# Patient Record
Sex: Male | Born: 1945
Health system: Southern US, Community
[De-identification: ages and names within clinical notes are randomized; demographics above are authoritative.]

## PROBLEM LIST (undated history)

## (undated) DIAGNOSIS — E7800 Pure hypercholesterolemia, unspecified: Secondary | ICD-10-CM

## (undated) DIAGNOSIS — I10 Essential (primary) hypertension: Secondary | ICD-10-CM

## (undated) DIAGNOSIS — C801 Malignant (primary) neoplasm, unspecified: Secondary | ICD-10-CM

## (undated) DIAGNOSIS — M1990 Unspecified osteoarthritis, unspecified site: Secondary | ICD-10-CM

## (undated) DIAGNOSIS — J189 Pneumonia, unspecified organism: Secondary | ICD-10-CM

## (undated) HISTORY — PX: INSERTION PROSTATE RADIATION SEED: SUR718

## (undated) HISTORY — PX: FOOT SURGERY: SHX648

---

## 1999-07-13 ENCOUNTER — Ambulatory Visit (HOSPITAL_COMMUNITY): Admission: RE | Admit: 1999-07-13 | Discharge: 1999-07-13 | Payer: Self-pay | Admitting: Gastroenterology

## 1999-07-13 ENCOUNTER — Encounter (INDEPENDENT_AMBULATORY_CARE_PROVIDER_SITE_OTHER): Payer: Self-pay | Admitting: Specialist

## 1999-12-26 ENCOUNTER — Ambulatory Visit (HOSPITAL_COMMUNITY): Admission: RE | Admit: 1999-12-26 | Discharge: 1999-12-26 | Payer: Self-pay | Admitting: Family Medicine

## 2003-09-28 ENCOUNTER — Ambulatory Visit (HOSPITAL_COMMUNITY): Admission: RE | Admit: 2003-09-28 | Discharge: 2003-09-28 | Payer: Self-pay | Admitting: Family Medicine

## 2003-11-04 ENCOUNTER — Ambulatory Visit (HOSPITAL_COMMUNITY): Admission: RE | Admit: 2003-11-04 | Discharge: 2003-11-04 | Payer: Self-pay | Admitting: Neurology

## 2007-06-11 HISTORY — PX: BACK SURGERY: SHX140

## 2007-10-20 ENCOUNTER — Inpatient Hospital Stay (HOSPITAL_COMMUNITY): Admission: RE | Admit: 2007-10-20 | Discharge: 2007-10-24 | Payer: Self-pay | Admitting: Neurological Surgery

## 2007-11-04 ENCOUNTER — Encounter: Admission: RE | Admit: 2007-11-04 | Discharge: 2007-11-04 | Payer: Self-pay | Admitting: Neurological Surgery

## 2008-01-07 ENCOUNTER — Observation Stay (HOSPITAL_COMMUNITY): Admission: RE | Admit: 2008-01-07 | Discharge: 2008-01-07 | Payer: Self-pay | Admitting: Neurological Surgery

## 2008-02-05 ENCOUNTER — Encounter: Admission: RE | Admit: 2008-02-05 | Discharge: 2008-02-05 | Payer: Self-pay | Admitting: Family Medicine

## 2009-02-14 ENCOUNTER — Encounter: Admission: RE | Admit: 2009-02-14 | Discharge: 2009-02-14 | Payer: Self-pay | Admitting: Orthopedic Surgery

## 2009-02-28 ENCOUNTER — Emergency Department (HOSPITAL_COMMUNITY): Admission: EM | Admit: 2009-02-28 | Discharge: 2009-02-28 | Payer: Self-pay | Admitting: Emergency Medicine

## 2009-10-10 ENCOUNTER — Ambulatory Visit (HOSPITAL_COMMUNITY): Admission: RE | Admit: 2009-10-10 | Discharge: 2009-10-10 | Payer: Self-pay | Admitting: Neurological Surgery

## 2009-11-07 IMAGING — CR DG CHEST 2V
2 series · 2 of 2 positions shown · non-contrast
Comparison: None

CLINICAL DATA: Preop for lumbar spine fusion, diabetes,
hypertension

CHEST - 2 VIEW

[view not recorded (1 of 2)]
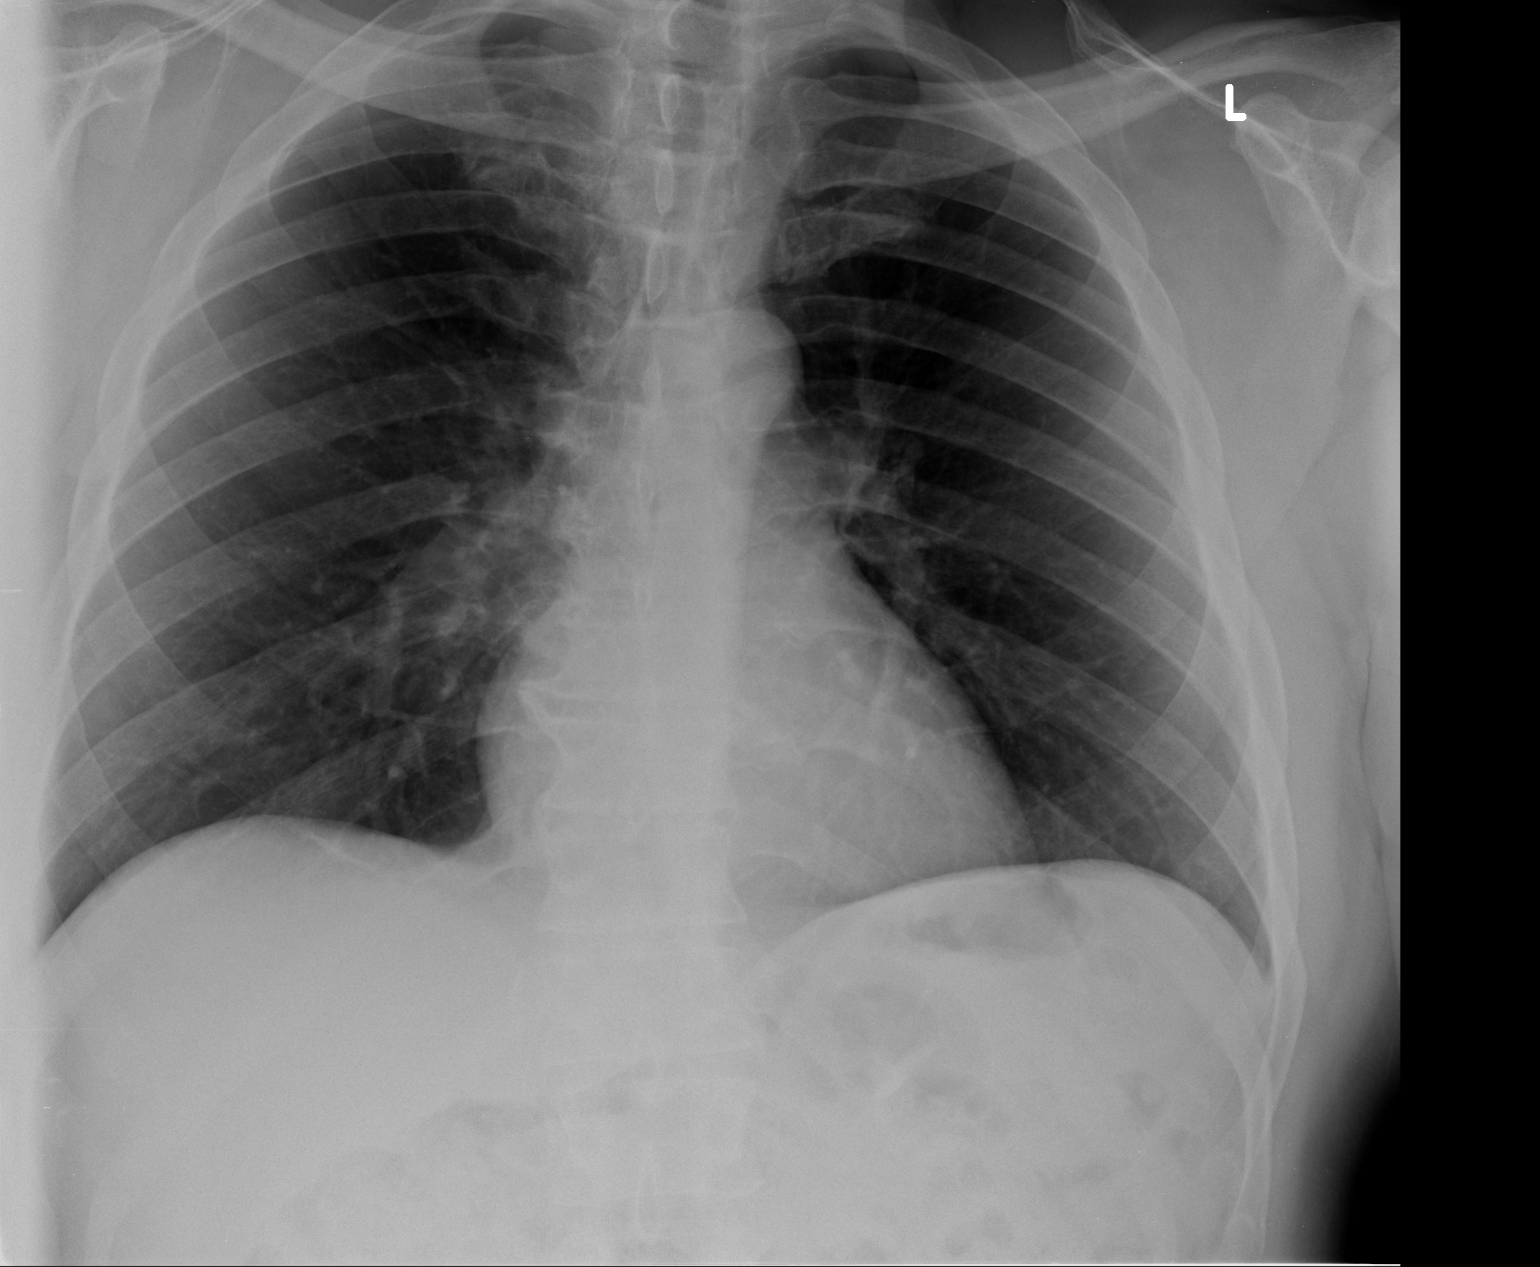

[view not recorded (2 of 2)]
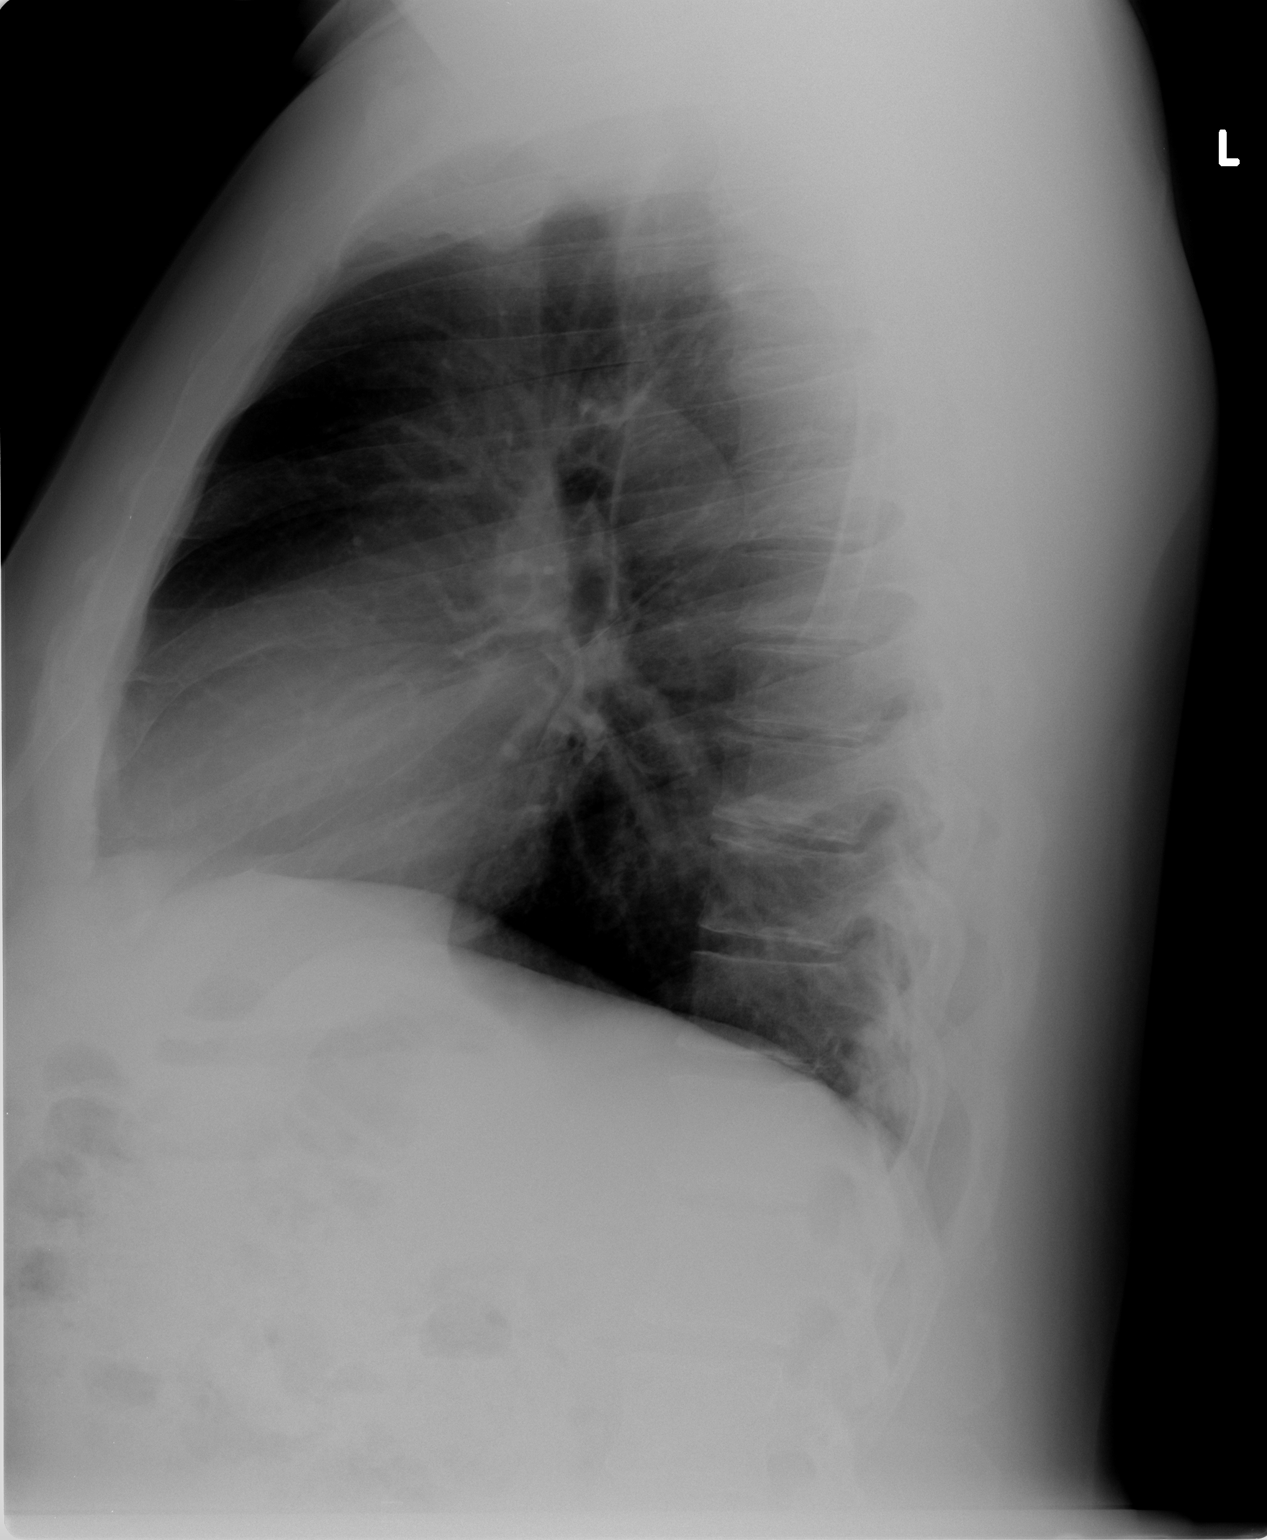

[2 of 2 positions shown; findings below may reference images not displayed]

FINDINGS: The lungs are clear.  Heart is within normal limits in
size.  There are degenerative changes throughout the mid to lower
thoracic spine.
IMPRESSION: No active lung disease.

## 2010-03-13 ENCOUNTER — Ambulatory Visit
Admission: RE | Admit: 2010-03-13 | Discharge: 2010-06-06 | Payer: Self-pay | Source: Home / Self Care | Attending: Radiation Oncology | Admitting: Radiation Oncology

## 2010-03-23 ENCOUNTER — Encounter: Admission: RE | Admit: 2010-03-23 | Discharge: 2010-03-23 | Payer: Self-pay | Admitting: Urology

## 2010-05-08 ENCOUNTER — Ambulatory Visit (HOSPITAL_BASED_OUTPATIENT_CLINIC_OR_DEPARTMENT_OTHER): Admission: RE | Admit: 2010-05-08 | Discharge: 2010-05-08 | Payer: Self-pay | Admitting: Urology

## 2010-06-05 ENCOUNTER — Ambulatory Visit
Admission: RE | Admit: 2010-06-05 | Discharge: 2010-07-10 | Payer: Self-pay | Source: Home / Self Care | Attending: Radiation Oncology | Admitting: Radiation Oncology

## 2010-07-11 ENCOUNTER — Ambulatory Visit: Payer: BC Managed Care – PPO | Admitting: Radiation Oncology

## 2010-08-21 LAB — APTT: aPTT: 29 seconds (ref 24–37)

## 2010-08-21 LAB — CBC
Hemoglobin: 14 g/dL (ref 13.0–17.0)
MCH: 30.1 pg (ref 26.0–34.0)
Platelets: 219 10*3/uL (ref 150–400)
RBC: 4.66 MIL/uL (ref 4.22–5.81)
WBC: 6.3 10*3/uL (ref 4.0–10.5)

## 2010-08-21 LAB — COMPREHENSIVE METABOLIC PANEL
ALT: 20 U/L (ref 0–53)
AST: 27 U/L (ref 0–37)
Albumin: 4.5 g/dL (ref 3.5–5.2)
Alkaline Phosphatase: 55 U/L (ref 39–117)
CO2: 30 mEq/L (ref 19–32)
Chloride: 104 mEq/L (ref 96–112)
GFR calc Af Amer: >60 mL/min (ref >60)
GFR calc non Af Amer: >60 mL/min (ref >60)
Potassium: 4.4 mEq/L (ref 3.5–5.1)
Sodium: 141 mEq/L (ref 135–145)
Total Bilirubin: 0.6 mg/dL (ref 0.3–1.2)

## 2010-08-28 LAB — GLUCOSE, CAPILLARY
Glucose-Capillary: 102 mg/dL — ABNORMAL HIGH (ref 70–99)
Glucose-Capillary: 90 mg/dL (ref 70–99)

## 2010-10-23 NOTE — Op Note (Signed)
Larry Welch, Larry Welch                ACCOUNT NO.:  [id number]   MEDICAL RECORD NO.:  [id number]          PATIENT TYPE:  INP   LOCATION:  3311                         FACILITY:  [facility]   PHYSICIAN:  Stefani Dama, M.D.  DATE OF BIRTH:  Jan 07, 1946   DATE OF PROCEDURE:  10/20/2007  DATE OF DISCHARGE:                               OPERATIVE REPORT   PREOPERATIVE DIAGNOSIS:  Lumbar spondylosis and stenosis with  spondylolisthesis at L4-L5, lumbar radiculopathy.   POSTOPERATIVE DIAGNOSIS:  Lumbar spondylosis and stenosis with  spondylolisthesis at L4-L5, lumbar radiculopathy.   OPERATION:  Bilateral laminotomies and diskectomies L4-L5, decompression  of L4 and L5 nerve roots with complete diskectomy at the interspace at  L4-L5, posterior lumbar interbody arthrodesis with peak spacers, local  autograft and allograft, infused pedicle screw fixation L4-L5 with  posterolateral arthrodesis using local autograft and allograft at L4-L5.   SURGEON:  Stefani Dama, MD   FIRST ASSISTANT:  Hilda Lias, MD   ANESTHESIA:  General endotracheal.   INDICATIONS:  Larry Welch is a 65 year old individual who has had  significant problems with lumbar spondylosis and radiculopathy.  He has  had a progressive degenerative spondylolisthesis developing at L4-L5.  I  treated him conservatively for a number of years.  Having failed this,  he was advised regarding surgical decompression arthrodesis.   PROCEDURE:  The patient was brought to the operating room supine on the  stretcher.  After smooth induction of general endotracheal anesthesia,  he was turned prone.  The back was prepped with alcohol and DuraPrep,  draped in sterile fashion.  Bony prominences were appropriately padded  and protected.  A midline incision was created in the lumbar spine and  carried down to lumbodorsal fascia which was opened on either side of  midline to expose the spinous processes of L4-L5.  Subperiosteal  dissection was  then carried out to the lateral gutters overlying the  markedly hypertrophied facets at L4-L5.  These were packed off and a  self-retaining retractor was placed deep in the wound.  Laminotomies  were then created removing the inferior margin lamina out to and  including the entirety of the facet at L4-L5.  Facet was noted to be  severely hypertrophied and resection of this piece of bone was then used  for later bone graft.  The thickened and redundant yellow ligament in  this area was then taken up and a common dural tube was exposed.  There  was noted to be significant step off of the L4 vertebrae on L5 and in  lateral recess, epidural veins in this area were also rather voluminous.  They were cauterized sequentially and then divided.  This allowed good  inspection of the takeoff of the L4 nerve root superiorly, the L5 nerve  root inferiorly, and significant lateral recess stenosis of the L5  laminar arch.  This was on the cut to allow for a good foraminotomy to  decompress the L5 nerve root.  This procedure was carried out  bilaterally and then the disk space was entered at L4-L5 and a complete  diskectomy was  performed both medially and laterally removing a  significant quantity of severely degenerated disk material.  Once disk  space was evacuated, a series of curettes and rongeurs were used to  decorticate the endplates on the superior aspect of L5 and the inferior  aspect of L4.  This was completed bilaterally and then spacers were  placed in the interspace to distract the space and also to reduce some  of the spondylolisthesis.  A 12-mm spacer was used on the right side and  meanwhile a 13-mm peak spacer was filled with combination of autograft,  allograft, and infused.  Bone was packed into the interspace, and then a  small opening was created for the peak spacer and this was then tapped  into the interspace first on the left side and then on the right side.  The remainder of the  interspace was filled with a combination of  allograft and some of the autograft.  Once this was completed,  fluoroscopy was used to check placement of the peak spacers and also to  place pedicle screws 6.5 x 45 mm screws were placed in L4-L5.  Each of  the screw holes was checked out for medial cut out, none was identified.  There was no cut out through the pedicles.  Short 40-mm precontoured  rods were then used to connect the pedicle screws in a one-level  construct.  These were torqued down to the final resting position.  The  lateral gutters were then packed with a combination of autograft and  allograft.  With this, the common dural tube was checked for hemostasis  and no spinal fluid leaks were noted.  The L4 and the L5 nerve roots  could easily be sounded out of the foramina of free and clear.  Lumbodorsal fascia was then closed with #1 Vicryl in an interrupted  fashion, 2-0 Vicryl using subcutaneous tissues, 3-0 Vicryl was used  subcuticularly.  It should be noted that during the case, a total of 25  mL of Marcaine was injected into the paraspinous musculature and fascia  for good postoperative analgesia.  The blood loss throughout the  procedure was estimated 300 mL.  The patient was returned to her  recovery room in stable condition.      Stefani Dama, M.D.  Electronically Signed     HJE/MEDQ  D:  10/20/2007  T:  10/21/2007  Job:  [id number]

## 2010-10-23 NOTE — Op Note (Signed)
[name], [name]                ACCOUNT NO.:  [id number]   MEDICAL RECORD NO.:  [id number]          PATIENT TYPE:  OBV   LOCATION:  3535                         FACILITY:  [facility]   PHYSICIAN:  Stefani Dama, M.D.  DATE OF BIRTH:  1946/05/24   DATE OF PROCEDURE:  DATE OF DISCHARGE:  01/07/2008                               OPERATIVE REPORT   PREOPERATIVE DIAGNOSIS:  Right L4 radiculopathy secondary to hardware  impingement.   POSTOPERATIVE DIAGNOSIS:  Right L4 radiculopathy secondary to hardware  impingement.   PROCEDURES:  Removal of right L4 and L5 pedicle screws with matrix.   SURGEON:  Stefani Dama, MD   ANESTHESIA:  General endotracheal.   INDICATIONS:  Larry Welch is a 65 year old individual who about 3  months ago underwent decompression and fusion of spondylolisthesis at L4-  L5.  Postoperatively, he has had a right L4 radiculopathy that has been  persistently bothering him, scans demonstrate that there is a very  slight medialization of the L4 screw that may be impinging on the L4  nerve root.  After failing efforts at conservative management noting  that his arthrodesis now appears to be stable and forming well.  He has  been advised regarding removal of the hardware.   PROCEDURE:  The patient was brought to the operating room supine on the  stretcher.  After smooth induction of general endotracheal anesthesia,  he was turned prone.  The back was prepped with alcohol and DuraPrep,  draped in sterile fashion.  Fluoroscopy was used to localize the  hardware on the right side.  The AP and lateral projections and then by  making a small incision in the skin after infiltrating with lidocaine 1%  with 0.5% Marcaine.  A K-wire was passed down to the dorsal surface of  the hardware and a series of dilators with matrix tube system was used  to place a 18-mm diameter, 6-cm deep cannula over the pedicle screws at  L5 first and then L4.  Caps were removed and then the rod  was loosened  and removed through the tube and then each of the pedicle screws were  removed without incident.  The area was infiltrated with 10 mL of 0.5%  Marcaine and then the fascia was closed with 3-0 Vicryl and the skin was  closed with 3-0 subcuticular Vicryl.  The patient tolerated the  procedure well.  Blood loss was less than 10 mL.      Stefani Dama, M.D.  Electronically Signed     HJE/MEDQ  D:  01/07/2008  T:  01/07/2008  Job:  [id number]

## 2010-10-23 NOTE — Discharge Summary (Signed)
[name], [name]                ACCOUNT NO.:  [id number]   MEDICAL RECORD NO.:  [id number]          PATIENT TYPE:  INP   LOCATION:  3003                         FACILITY:  [facility]   PHYSICIAN:  Stefani Dama, M.D.  DATE OF BIRTH:  1946-04-17   DATE OF ADMISSION:  10/20/2007  DATE OF DISCHARGE:                               DISCHARGE SUMMARY   ADMITTING DIAGNOSIS:  Lumbar spondylosis and stenosis L4-L5 with lumbar  radiculopathy.   DISCHARGE AND FINAL DIAGNOSES:  1. Lumbar spondylosis and spondylolisthesis L4-L5, lumbar      radiculopathy.  2. Postoperative constipation.   CONDITION ON DISCHARGE:  Improving.   HOSPITAL COURSE:  Larry Welch is a 65 year old individual who has had  significant back and bilateral lower extremity pain.  He has evidence of  spondylolisthesis at L4-L5.  He is advised regarding admission and  surgical decompression which was performed on Oct 20, 2007,  postoperatively.  The patient initially required some IV pain medication  and this was gradually changed to oral pain medication.  He seemed to  tolerate this well.  His incision on his back is remained clean and dry.  During the next couple of days, he had remained constipated.  The  patient required some use of laxatives and a suppository, and ultimately  on the 4th postoperative day, his bowels moved.  He felt substantially  better particularly with regard to back and leg pain.  He had some  cramps in the left anterior thigh, but this is improving at the time of  discharge.  He is given a prescription for Percocet #60 without refills  and Valium 5 mg #40 without refills.  He will be seen in the office for  further followup in 2 weeks' time.      Stefani Dama, M.D.  Electronically Signed     HJE/MEDQ  D:  10/23/2007  T:  10/24/2007  Job:  [id number]

## 2010-10-26 NOTE — Procedures (Signed)
[place]  Patient:    Larry Welch                        MRN: [id number] Proc. Date: 07/13/99 Adm. Date:  [id number] Attending:  Nelda Marseille CC:         [name], M.D.             Petra Kuba, M.D.                           Procedure Report  PROCEDURE:  Colonoscopy with biopsy.  INDICATION:  The patient with history of colon polyps.  Due for colonic screening. Consent was signed after risks, benefits, methods, and options were thoroughly discussed in the office.  PREMEDICATION:  Demerol 50 mg and Versed 5 mg IV.  DESCRIPTION OF PROCEDURE:  Rectal inspection is pertinent for small external hemorrhoids.  Digital exam was negative.  Video colonoscope was inserted and easily advanced around the colon to the cecum. This did require some left lower quadrant pressure but no position changes; however, to get a better look at the cecum, we did go ahead and roll him on his  back.  The cecum was identified by the appendiceal orifice and the ileocecal valve. The prep was adequate.  There was some liquid stool that required washing and suctioning particularly on the right right side.  The scope was then slowly withdrawn.  The cecum, ascending, transverse, and majority of the descending were normal.  The proximal level of he sigmoid and descending junction, a 1 to 2 mm polyp was seen and cold biopsy x 2. Scope was further withdrawn.  No other abnormalities were seen as we slowly withdrew back to the rectum.  Once back in the rectum, the scope was retroflexed.  Pertinent for some internal hemorrhoids.  The scope was straightened and anal rectal pull-through confirmed the above.  The scope was reinserted a short ways up the sigmoid.  Air was suctioned. Scope removed.  The patient tolerated the procedure well.  There was no obvious immediate complication.  DIAGNOSES: 1. Internal/external hemorrhoids. 2. Questionable tiny  left-sided polyp, status post cold biopsy. 3. Otherwise within normal limits to the cecum.  PLAN:  Yearly and guaiacs per Dr. [name].  Happy to see back p.r.n. Otherwise await pathology but probably recheck colonoscopy in five years. DD:  07/13/99 TD:  07/14/99 Job: [id number]

## 2011-03-08 LAB — CBC
HCT: 36.9 — ABNORMAL LOW
MCV: 88.6
Platelets: 220
RBC: 4.16 — ABNORMAL LOW
WBC: 4.8

## 2011-03-08 LAB — BASIC METABOLIC PANEL
BUN: 8
Chloride: 109
GFR calc Af Amer: >60
GFR calc non Af Amer: >60
Potassium: 4.5

## 2011-05-21 ENCOUNTER — Encounter: Payer: Self-pay | Admitting: *Deleted

## 2011-05-21 NOTE — Progress Notes (Signed)
IPPIS results 03/13/10= [id number] score=12

## 2012-02-09 ENCOUNTER — Emergency Department (HOSPITAL_COMMUNITY)
Admission: EM | Admit: 2012-02-09 | Discharge: 2012-02-09 | Disposition: A | Discharge status: Home / Self Care | Payer: BC Managed Care – PPO | Attending: Emergency Medicine | Admitting: Emergency Medicine

## 2012-02-09 ENCOUNTER — Encounter (HOSPITAL_COMMUNITY): Payer: Self-pay | Admitting: Emergency Medicine

## 2012-02-09 ENCOUNTER — Emergency Department (HOSPITAL_COMMUNITY): Payer: BC Managed Care – PPO

## 2012-02-09 DIAGNOSIS — Z8546 Personal history of malignant neoplasm of prostate: Secondary | ICD-10-CM | POA: Insufficient documentation

## 2012-02-09 DIAGNOSIS — Y9301 Activity, walking, marching and hiking: Secondary | ICD-10-CM | POA: Insufficient documentation

## 2012-02-09 DIAGNOSIS — E7800 Pure hypercholesterolemia, unspecified: Secondary | ICD-10-CM | POA: Insufficient documentation

## 2012-02-09 DIAGNOSIS — S92309A Fracture of unspecified metatarsal bone(s), unspecified foot, initial encounter for closed fracture: Secondary | ICD-10-CM | POA: Insufficient documentation

## 2012-02-09 DIAGNOSIS — I10 Essential (primary) hypertension: Secondary | ICD-10-CM | POA: Insufficient documentation

## 2012-02-09 DIAGNOSIS — S92302A Fracture of unspecified metatarsal bone(s), left foot, initial encounter for closed fracture: Secondary | ICD-10-CM

## 2012-02-09 DIAGNOSIS — W108XXA Fall (on) (from) other stairs and steps, initial encounter: Secondary | ICD-10-CM | POA: Insufficient documentation

## 2012-02-09 DIAGNOSIS — Y998 Other external cause status: Secondary | ICD-10-CM | POA: Insufficient documentation

## 2012-02-09 DIAGNOSIS — E119 Type 2 diabetes mellitus without complications: Secondary | ICD-10-CM | POA: Insufficient documentation

## 2012-02-09 HISTORY — DX: Pure hypercholesterolemia, unspecified: E78.00

## 2012-02-09 HISTORY — DX: Malignant (primary) neoplasm, unspecified: C80.1

## 2012-02-09 HISTORY — DX: Essential (primary) hypertension: I10

## 2012-02-09 MED ORDER — HYDROCODONE-ACETAMINOPHEN 5-325 MG PO TABS: 1.0000 | ORAL_TABLET | ORAL | Status: AC | PRN

## 2012-02-09 MED ORDER — IBUPROFEN 400 MG PO TABS
800.0000 mg | ORAL_TABLET | Freq: Once | ORAL | Status: AC
  Administered 2012-02-09: 800 mg via ORAL
  Filled 2012-02-09: qty 2

## 2012-02-09 NOTE — Progress Notes (Signed)
Orthopedic Tech Progress Note Patient Details:  Larry Welch 1945/12/06 [id number] Post. Short leg splint applied to Left LE, crutches with instruction given Ortho Devices Type of Ortho Device: Short leg splint;Crutches Ortho Device/Splint Location: Post. short leg to Left LE Ortho Device/Splint Interventions: Application   Asia R Thompson 02/09/2012, 9:54 PM

## 2012-02-09 NOTE — ED Notes (Signed)
Pt c/o left foot pain. Pt states he missed a step going down stairs. No swelling, deformity noted. Pt able to ambulate, but with pain

## 2012-02-09 NOTE — ED Provider Notes (Signed)
History     CSN: [id number]  Arrival date & time 02/09/12  1843   First MD Initiated Contact with Patient 02/09/12 2045      Chief Complaint  Patient presents with  . Foot Pain   HPI  History provided by the patient. Patient is a 66 year old male with history of hypertension, hypercholesterolemia, diabetes and prostate cancer who presents with left foot pain and injury. Patient states he was walking down the steps of the porch around 2 PM and accidentally missed a step causing him to come down hard on his left foot. Patient did not fall or have any other injury. He denies any fevers or inversion of the ankle. He complains of pain over the arch of the foot primarily on the lateral aspect. Symptoms have been associated with some swelling. Pain is worse with walking and pressure. Patient has not used any other treatments for symptoms. He denies any numbness or weakness in the feet or toes.    Past Medical History  Diagnosis Date  . Hypertension   . High cholesterol   . Diabetes mellitus   . Cancer     prostate    History reviewed. No pertinent past surgical history.  No family history on file.  History  Substance Use Topics  . Smoking status: Never Smoker   . Smokeless tobacco: Not on file  . Alcohol Use: Yes      Review of Systems  Musculoskeletal:       Left foot pain  Neurological: Negative for weakness and numbness.    Allergies  Review of patient's allergies indicates no known allergies.  Home Medications   Current Outpatient Rx  Name Route Sig Dispense Refill  . ALISKIREN-HYDROCHLOROTHIAZIDE 300-25 MG PO TABS Oral Take 1 tablet by mouth daily.    Marland Kitchen AMLODIPINE-OLMESARTAN 10-40 MG PO TABS Oral Take 1 tablet by mouth daily.    . CHOLINE FENOFIBRATE 135 MG PO CPDR Oral Take 135 mg by mouth daily.    Marland Kitchen DILTIAZEM HCL ER COATED BEADS 180 MG PO CP24 Oral Take 180 mg by mouth daily.    Marland Kitchen METFORMIN HCL 500 MG PO TABS Oral Take 500 mg by mouth daily.    Marland Kitchen NIACIN ER  (ANTIHYPERLIPIDEMIC) 1000 MG PO TBCR Oral Take 1,000 mg by mouth at bedtime.    Marland Kitchen ROSUVASTATIN CALCIUM 10 MG PO TABS Oral Take 10 mg by mouth daily.    Marland Kitchen RAPAFLO PO Oral Take 1 capsule by mouth daily.    Marland Kitchen SITAGLIPTIN PHOSPHATE 100 MG PO TABS Oral Take 100 mg by mouth daily.      BP 143/78  Pulse 65  Temp 98.1 F (36.7 C) (Oral)  Resp 18  SpO2 98%  Physical Exam  Nursing note and vitals reviewed. Constitutional: He is oriented to person, place, and time. He appears well-developed and well-nourished. No distress.  HENT:  Head: Normocephalic.  Cardiovascular: Normal rate and regular rhythm.   Pulmonary/Chest: Effort normal and breath sounds normal.  Musculoskeletal: He exhibits edema.       Mild swelling of the left foot. No gross deformities. Normal dorsal pedal pulses. Normal sensation and cap refill in all toes. There is some tenderness over the anterior portion of foot greatest over the lateral aspects and fifth metatarsal area.  Neurological: He is alert and oriented to person, place, and time.  Skin: Skin is warm. No rash noted. No erythema.  Psychiatric: He has a normal mood and affect. His behavior is normal.  ED Course  Procedures   Dg Foot Complete Left  02/09/2012  *RADIOLOGY REPORT*  Clinical Data: Lateral pain status post fall.  LEFT FOOT - COMPLETE 3+ VIEW  Comparison: None.  Findings: The bones appear mildly demineralized.  There is an oblique nondisplaced fracture of the fifth metatarsal diaphysis. This extends from the base to the neck, but shows no definite intra- articular extension.  There is no subluxation at the Lisfranc joint.  There are mild underlying degenerative changes at the first metatarsal phalangeal joint.  IMPRESSION: Oblique nondisplaced diaphyseal fracture of the fifth metatarsal without demonstrated intra-articular extension.   Original Report Authenticated By: Gerrianne Scale, M.D.      1. Fracture of metatarsal bone of left foot       MDM   9:10 PM patient seen and evaluated. Patient resting comfortably. X-rays with oblique fracture through left fifth metatarsal. No displacement. Will place patient in a short-leg splint and provided crutches. Patient will followup with orthopedics, Dr. Ophelia Charter on Tuesday.       Angus Seller, [place] 02/10/12 [phone]

## 2012-02-09 NOTE — ED Notes (Signed)
Pt d/c home in NAD. Pt voiced understanding of d/c instructions and follow up care. Pt instructed not to drive after taking norco.

## 2012-02-09 NOTE — ED Notes (Signed)
C/o L foot pain x 3 hours.  Pt states he missed a step while going down steps.

## 2012-02-10 NOTE — ED Provider Notes (Signed)
Medical screening examination/treatment/procedure(s) were performed by non-physician practitioner and as supervising physician I was immediately available for consultation/collaboration.    Joya Gaskins, MD 02/10/12 [phone]

## 2012-09-11 ENCOUNTER — Encounter: Payer: Self-pay | Admitting: Pulmonary Disease

## 2012-09-11 ENCOUNTER — Ambulatory Visit (INDEPENDENT_AMBULATORY_CARE_PROVIDER_SITE_OTHER): Payer: BC Managed Care – PPO | Admitting: Pulmonary Disease

## 2012-09-11 VITALS — BP 138/82 | HR 77 | Temp 98.3°F | Ht 70.0 in | Wt 239.2 lb

## 2012-09-11 DIAGNOSIS — G4733 Obstructive sleep apnea (adult) (pediatric): Secondary | ICD-10-CM

## 2012-09-11 NOTE — Assessment & Plan Note (Signed)
The patient has moderate obstructive sleep apnea by his recent sleep testing, and is clearly symptomatic during the day with impact her quality of life.  He also has underlying medical issues that can be influenced by sleep disordered breathing.  I had a long discussion with him about the pathophysiology of sleep apnea, including its impact to his quality of life and cardiovascular health.  I have reviewed the various treatment options, including weight loss, upper airway surgery, dental appliance, and also CPAP.  I really think CPAP offers him the best chance for optimal treatment while he is working on weight loss.  The patient is agreeable to this approach.  I will set the patient up on cpap at a moderate pressure level to allow for desensitization, and will troubleshoot the device over the next 4-6weeks if needed.  The pt is to call me if having issues with tolerance.  Will then optimize the pressure once patient is able to wear cpap on a consistent basis.

## 2012-09-11 NOTE — Progress Notes (Signed)
Subjective:    Patient ID: Larry Welch, male    DOB: [birth year], 67 y.o.   MRN: [id number]  HPI The patient is a 67 year old male who I've been asked to see for management of obstructive sleep apnea.  He has had home sleep testing on 2 consecutive nights, with an AHI between 24 and 27 events per hour noted.  The patient is unsure if he snores, but his wife has mentioned an abnormal breathing pattern during sleep.  He is not rested in the mornings upon arising, and notes definite sleep pressure during the day while at work that can lead to dozing at times.  The patient also will fall asleep easily in the evening watching television, and has had some sleepiness with driving.  His weight is neutral of the last few years, and his upper score today is 19.  Sleep Questionnaire What time do you typically go to bed?( Between what hours) 11p 11p at 1408 on 09/11/12 by Nita Sells, CMA How long does it take you to fall asleep? 5-68mins 5-30mins at 1408 on 09/11/12 by Nita Sells, CMA How many times during the night do you wake up? 3 3 at 1408 on 09/11/12 by Nita Sells, CMA What time do you get out of bed to start your day? 0530 0530 at 1408 on 09/11/12 by Nita Sells, CMA Do you drive or operate heavy machinery in your occupation? 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 (In pounds) 5 lb (2.268 kg)5 lb (2.268 kg) 3-5lbs increase/decrease---varies at 1408 on 09/11/12 by Nita Sells, CMA Have you ever had a sleep study before? Yes Yes at 1408 on 09/11/12 by Nita Sells, CMA If yes, location of study? [facility] study SNAP study at 1408 on 09/11/12 by Nita Sells, CMA If yes, date of study? jan 2014 jan 2014 at 1408 on 09/11/12 by Nita Sells, CMA Do you currently use CPAP? No No at 1408 on 09/11/12 by Marjo Bicker Mabe, CMA Do you wear oxygen at any time?  No No at 1408 on 09/11/12 by Marjo Bicker Mabe, CMA    Review of Systems  Constitutional: Negative for fever and unexpected weight change.  HENT: Positive for congestion. Negative for ear pain, nosebleeds, sore throat, rhinorrhea, sneezing, trouble swallowing, dental problem, postnasal drip and sinus pressure.   Eyes: Negative for redness and itching.  Respiratory: Positive for shortness of breath. Negative for cough, chest tightness and wheezing.   Cardiovascular: Negative for palpitations and leg swelling.  Gastrointestinal: Negative for nausea and vomiting.  Genitourinary: Negative for dysuria.  Musculoskeletal: Positive for joint swelling and arthralgias.  Skin: Negative for rash.  Neurological: Positive for headaches.  Hematological: Does not bruise/bleed easily.  Psychiatric/Behavioral: Negative for dysphoric mood. The patient is not nervous/anxious.        Objective:   Physical Exam Constitutional:  Well developed, no acute distress  HENT:  Nares patent without discharge, septal deviation to the left with narrowing.   Oropharynx without exudate, palate and uvula are mildly elongated.   Eyes:  Perrla, eomi, no scleral icterus  Neck:  No JVD, no TMG  Cardiovascular:  Normal rate, regular rhythm, no rubs or gallops.  No murmurs        Intact distal pulses  Pulmonary :  Normal breath sounds, no stridor or respiratory distress  No rales, rhonchi, or wheezing  Abdominal:  Soft, nondistended, bowel sounds present.  No tenderness noted.   Musculoskeletal:  No lower extremity edema noted.  Lymph Nodes:  No cervical lymphadenopathy noted  Skin:  No cyanosis noted  Neurologic:  Sleepy but appropriate, moves all 4 extremities without obvious deficit.         Assessment & Plan:

## 2012-09-11 NOTE — Patient Instructions (Addendum)
Will start on cpap.  Please call if you are having tolerance issues. Work on weight loss followup with me in 6 weeks.

## 2012-10-23 ENCOUNTER — Ambulatory Visit: Payer: BC Managed Care – PPO | Admitting: Pulmonary Disease

## 2013-07-16 ENCOUNTER — Other Ambulatory Visit: Payer: Self-pay | Admitting: Family Medicine

## 2013-07-16 ENCOUNTER — Ambulatory Visit
Admission: RE | Admit: 2013-07-16 | Discharge: 2013-07-16 | Disposition: A | Discharge status: Home / Self Care | Payer: 59 | Source: Ambulatory Visit | Attending: Family Medicine | Admitting: Family Medicine

## 2013-07-16 DIAGNOSIS — R52 Pain, unspecified: Secondary | ICD-10-CM

## 2013-07-16 DIAGNOSIS — T1490XA Injury, unspecified, initial encounter: Secondary | ICD-10-CM

## 2013-10-17 ENCOUNTER — Encounter (HOSPITAL_COMMUNITY): Payer: Self-pay | Admitting: Emergency Medicine

## 2013-10-17 ENCOUNTER — Emergency Department (HOSPITAL_COMMUNITY)
Admission: EM | Admit: 2013-10-17 | Discharge: 2013-10-18 | Disposition: A | Discharge status: Home / Self Care | Payer: Medicare Other | Attending: Emergency Medicine | Admitting: Emergency Medicine

## 2013-10-17 DIAGNOSIS — Z87891 Personal history of nicotine dependence: Secondary | ICD-10-CM | POA: Insufficient documentation

## 2013-10-17 DIAGNOSIS — E119 Type 2 diabetes mellitus without complications: Secondary | ICD-10-CM | POA: Insufficient documentation

## 2013-10-17 DIAGNOSIS — E7800 Pure hypercholesterolemia, unspecified: Secondary | ICD-10-CM | POA: Insufficient documentation

## 2013-10-17 DIAGNOSIS — IMO0002 Reserved for concepts with insufficient information to code with codable children: Secondary | ICD-10-CM | POA: Insufficient documentation

## 2013-10-17 DIAGNOSIS — R319 Hematuria, unspecified: Secondary | ICD-10-CM | POA: Insufficient documentation

## 2013-10-17 DIAGNOSIS — R109 Unspecified abdominal pain: Secondary | ICD-10-CM | POA: Insufficient documentation

## 2013-10-17 DIAGNOSIS — Z79899 Other long term (current) drug therapy: Secondary | ICD-10-CM | POA: Insufficient documentation

## 2013-10-17 DIAGNOSIS — Z8546 Personal history of malignant neoplasm of prostate: Secondary | ICD-10-CM | POA: Insufficient documentation

## 2013-10-17 DIAGNOSIS — R339 Retention of urine, unspecified: Secondary | ICD-10-CM

## 2013-10-17 DIAGNOSIS — I10 Essential (primary) hypertension: Secondary | ICD-10-CM | POA: Insufficient documentation

## 2013-10-17 NOTE — ED Notes (Signed)
Pt states he has been trying to urinate since this afternnon, but unable to.  Pt states he last urinated around 1100 and did not notice anything abnormal at that time.  Pt states left rear flank pain as well.

## 2013-10-17 NOTE — ED Notes (Signed)
C/o L flank pain and unable to urinate.  Last urinated at 11am.  States he attempted to urinate x 2 and had "drops of blood".

## 2013-10-18 ENCOUNTER — Encounter (HOSPITAL_COMMUNITY): Payer: Self-pay | Admitting: Emergency Medicine

## 2013-10-18 ENCOUNTER — Emergency Department (HOSPITAL_COMMUNITY)
Admission: EM | Admit: 2013-10-18 | Discharge: 2013-10-18 | Disposition: A | Discharge status: Home / Self Care | Payer: Medicare Other | Source: Home / Self Care | Attending: Emergency Medicine | Admitting: Emergency Medicine

## 2013-10-18 DIAGNOSIS — T83091A Other mechanical complication of indwelling urethral catheter, initial encounter: Secondary | ICD-10-CM

## 2013-10-18 DIAGNOSIS — R310 Gross hematuria: Secondary | ICD-10-CM

## 2013-10-18 LAB — URINALYSIS, ROUTINE W REFLEX MICROSCOPIC
BILIRUBIN URINE: NEGATIVE
GLUCOSE, UA: NEGATIVE mg/dL
KETONES UR: 15 mg/dL — AB
NITRITE: NEGATIVE
PH: 7.5 (ref 5.0–8.0)
Protein, ur: 100 mg/dL — AB
SPECIFIC GRAVITY, URINE: 1.017 (ref 1.005–1.030)
Urobilinogen, UA: 1 mg/dL (ref 0.0–1.0)

## 2013-10-18 LAB — I-STAT CHEM 8, ED
BUN: 12 mg/dL (ref 6–23)
CALCIUM ION: 1.17 mmol/L (ref 1.13–1.30)
CHLORIDE: 106 meq/L (ref 96–112)
Creatinine, Ser: 1.1 mg/dL (ref 0.50–1.35)
GLUCOSE: 107 mg/dL — AB (ref 70–99)
HEMATOCRIT: 43 % (ref 39.0–52.0)
Hemoglobin: 14.6 g/dL (ref 13.0–17.0)
POTASSIUM: 3.9 meq/L (ref 3.7–5.3)
Sodium: 143 mEq/L (ref 137–147)
TCO2: 24 mmol/L (ref 0–100)

## 2013-10-18 LAB — URINE MICROSCOPIC-ADD ON

## 2013-10-18 MED ORDER — CEPHALEXIN 500 MG PO CAPS: 500.0000 mg | ORAL_CAPSULE | Freq: Four times a day (QID) | ORAL | Status: DC

## 2013-10-18 NOTE — ED Provider Notes (Signed)
CSN: [id number]     Arrival date & time 10/18/13  0537 History   First MD Initiated Contact with Patient 10/18/13 0550     Chief Complaint  Patient presents with  . Urinary Retention     (Consider location/radiation/quality/duration/timing/severity/associated sxs/prior Treatment) The history is provided by the patient.   68 year old male with history of prostate cancer treated with radiation seeds had been in the ED earlier with urinary retention treated with Foley catheter. After going home, he noted a suprapubic pain and a sense that he was urinating. Pain is severe and he rates it at 8/10. There is no fever or chills. He last had normal urination at about 11 AM yesterday and had noticed some blood when he tried to urinate in the evening.  Past Medical History  Diagnosis Date  . Hypertension   . High cholesterol   . Diabetes mellitus   . Cancer     prostate   Past Surgical History  Procedure Laterality Date  . Back surgery  2009  . Foot surgery  19080s    Ganglion cyst inner right foot.   Family History  Problem Relation Age of Onset  . Cancer Father    History  Substance Use Topics  . Smoking status: Former Smoker -- 0.75 packs/day for 12 years    Types: Cigarettes    Quit date: 06/10/1989  . Smokeless tobacco: Not on file  . Alcohol Use: Yes    Review of Systems  All other systems reviewed and are negative.     Allergies  Review of patient's allergies indicates no known allergies.  Home Medications   Prior to Admission medications   Medication Sig Start Date End Date Taking? Authorizing Provider  aliskiren (TEKTURNA) 300 MG tablet Take 300 mg by mouth at bedtime.   Yes Historical Provider, MD  amLODipine-olmesartan (AZOR) 10-40 MG per tablet Take 1 tablet by mouth at bedtime.   Yes Historical Provider, MD  aspirin 325 MG tablet Take 325 mg by mouth at bedtime.   Yes Historical Provider, MD  cephALEXin (KEFLEX) 500 MG capsule Take 1 capsule (500 mg total) by  mouth 4 (four) times daily. 10/18/13  Yes Johnna Acosta, MD  Choline Fenofibrate (FENOFIBRIC ACID) 135 MG CPDR Take 1 tablet by mouth at bedtime.   Yes Historical Provider, MD  metFORMIN (GLUCOPHAGE) 500 MG tablet Take 500 mg by mouth at bedtime.   Yes Historical Provider, MD  niacin (NIASPAN) 1000 MG CR tablet Take 1,000 mg by mouth at bedtime.   Yes Historical Provider, MD  rosuvastatin (CRESTOR) 10 MG tablet Take 10 mg by mouth at bedtime.   Yes Historical Provider, MD  silodosin (RAPAFLO) 8 MG CAPS capsule Take 8 mg by mouth at bedtime.   Yes Historical Provider, MD  sitaGLIPtin (JANUVIA) 100 MG tablet Take 100 mg by mouth at bedtime.   Yes Historical Provider, MD   BP 186/104  Pulse 99  Temp(Src) 97.8 F (36.6 C) (Oral)  Resp 19  SpO2 97% Physical Exam  Nursing note and vitals reviewed.  68 year old male, who appears uncomfortable, but is in no acute distress. Vital signs are significant for hypertension with blood pressure 186/104. Oxygen saturation is 97%, which is normal. Head is normocephalic and atraumatic. PERRLA, EOMI. Oropharynx is clear. Neck is nontender and supple without adenopathy or JVD. Back is nontender and there is no CVA tenderness. Lungs are clear without rales, wheezes, or rhonchi. Chest is nontender. Heart has regular rate and rhythm  without murmur. Abdomen is soft, flat. Bladder is moderately distended and tender. There are no other masses or hepatosplenomegaly and peristalsis is normoactive. Foley catheter leg bag as she is pink to almost red urine but without clots Extremities have no cyanosis or edema, full range of motion is present. Skin is warm and dry without rash. Neurologic: Mental status is normal, cranial nerves are intact, there are no motor or sensory deficits.  ED Course  Procedures (including critical care time)  MDM   Final diagnoses:  Obstructed Foley catheter  Gross hematuria    Urinary retention in spite of Foley catheter.  Hematuria. Old records are reviewed and he had been seen last night for urinary retention with placement of Foley catheter. Bloody urine was noted on that occasion. Foley catheter will be replaced with a larger catheter.  He had good drainage from the bladder with the placement of 18 French Foley catheter. One clot was also extracted. He is referred back to his urologist to be evaluated later today.  Delora Fuel, MD 03/02/29 0762

## 2013-10-18 NOTE — ED Notes (Signed)
Per Patient: Patient seen earlier tonight for urinary retention. Pt was discharged home with foley. Pt now c/o bleeding from around catheter site. Pt reports severe pain, as if he is still retaining fluid. Pt ax4, NAD.

## 2013-10-18 NOTE — ED Notes (Signed)
Switched Foley bag out with a leg bag.

## 2013-10-18 NOTE — ED Provider Notes (Signed)
CSN: [id number]     Arrival date & time 10/17/13  2319 History   First MD Initiated Contact with Patient 10/18/13 0006     Chief Complaint  Patient presents with  . Flank Pain  . Urinary Retention     (Consider location/radiation/quality/duration/timing/severity/associated sxs/prior Treatment) HPI Comments: The patient is a 68 year old male with history of hypertension, diabetes and prostate cancer status post seeds which were placed in the prostate in the past. He states that he usually has no difficulty urinating, yesterday's urine was yellow and he was able to urinate without any hesitancy. He last urinated at 11:00 AM, since that time he has not been able to pass any urine and has described a progressive suprapubic pain which is spreading towards his umbilicus. He denies any fevers or chills nausea or vomiting but does state that he has some back pain. He states that this back pain has been present for a long time and his family doctor is referring him to a pain clinic for this. He has noticed a couple of blood clots passing from the urethral meatus over the last couple of hours but no urine. Nothing seems to make this better or worse, symptoms are constant and gradually worsening.  Patient is a 67 y.o. male presenting with flank pain. The history is provided by the patient.  Flank Pain    Past Medical History  Diagnosis Date  . Hypertension   . High cholesterol   . Diabetes mellitus   . Cancer     prostate   Past Surgical History  Procedure Laterality Date  . Back surgery  2009  . Foot surgery  19080s    Ganglion cyst inner right foot.   Family History  Problem Relation Age of Onset  . Cancer Father    History  Substance Use Topics  . Smoking status: Former Smoker -- 0.75 packs/day for 12 years    Types: Cigarettes    Quit date: 06/10/1989  . Smokeless tobacco: Not on file  . Alcohol Use: Yes    Review of Systems  Constitutional: Negative for fever.   Gastrointestinal: Negative for nausea and vomiting.  Genitourinary: Positive for flank pain and difficulty urinating.  All other systems reviewed and are negative.     Allergies  Review of patient's allergies indicates no known allergies.  Home Medications   Prior to Admission medications   Medication Sig Start Date End Date Taking? Authorizing Provider  Aliskiren-Hydrochlorothiazide (TEKTURNA HCT) 300-25 MG TABS Take 1 tablet by mouth daily.    Historical Provider, MD  amLODipine-olmesartan (AZOR) 10-40 MG per tablet Take 1 tablet by mouth daily.    Historical Provider, MD  Choline Fenofibrate (TRILIPIX) 135 MG capsule Take 135 mg by mouth daily.    Historical Provider, MD  diltiazem (CARDIZEM CD) 180 MG 24 hr capsule Take 180 mg by mouth daily.    Historical Provider, MD  fluticasone (FLONASE) 50 MCG/ACT nasal spray Place 1-2 sprays into the nose daily. 07/02/12   Historical Provider, MD  metFORMIN (GLUCOPHAGE) 500 MG tablet Take 500 mg by mouth daily.    Historical Provider, MD  niacin (NIASPAN) 1000 MG CR tablet Take 1,000 mg by mouth at bedtime.    Historical Provider, MD  rosuvastatin (CRESTOR) 10 MG tablet Take 10 mg by mouth daily.    Historical Provider, MD  Silodosin (RAPAFLO PO) Take 1 capsule by mouth daily.    Historical Provider, MD  sitaGLIPtin (JANUVIA) 100 MG tablet Take 100 mg by mouth  daily.    Historical Provider, MD   BP 164/100  Pulse 67  Temp(Src) 97.6 F (36.4 C) (Oral)  Resp 16  SpO2 97% Physical Exam  Nursing note and vitals reviewed. Constitutional: He appears well-developed and well-nourished.  Uncomfortable appearing  HENT:  Head: Normocephalic and atraumatic.  Mouth/Throat: Oropharynx is clear and moist. No oropharyngeal exudate.  Eyes: Conjunctivae and EOM are normal. Pupils are equal, round, and reactive to light. Right eye exhibits no discharge. Left eye exhibits no discharge. No scleral icterus.  Neck: Normal range of motion. Neck supple. No  JVD present. No thyromegaly present.  Cardiovascular: Normal rate, regular rhythm, normal heart sounds and intact distal pulses.  Exam reveals no gallop and no friction rub.   No murmur heard. Pulmonary/Chest: Effort normal and breath sounds normal. No respiratory distress. He has no wheezes. He has no rales.  Abdominal: Soft. Bowel sounds are normal. He exhibits no distension and no mass. There is tenderness (tenderness located in the suprapubic and lower abdomen up to the umbilicus.no upper abdominal tenderness, full sensation in the lower abdomen on palpation though no discrete mass was palpated).  Genitourinary:  2 very small blood clots in the patient's undergarments, no signs of blood at the urethral meatus, no testicular swelling or hernias present  Musculoskeletal: Normal range of motion. He exhibits no edema and no tenderness.  Lymphadenopathy:    He has no cervical adenopathy.  Neurological: He is alert. Coordination normal.  Gait is normal, speech is clear, moves all 4 extremities without difficulty  Skin: Skin is warm and dry. No rash noted. No erythema.  Psychiatric: He has a normal mood and affect. His behavior is normal.    ED Course  Procedures (including critical care time) Labs Review Labs Reviewed  URINALYSIS, ROUTINE W REFLEX MICROSCOPIC - Abnormal; Notable for the following:    Color, Urine RED (*)    APPearance TURBID (*)    Hgb urine dipstick LARGE (*)    Ketones, ur 15 (*)    Protein, ur 100 (*)    Leukocytes, UA SMALL (*)    All other components within normal limits  I-STAT CHEM 8, ED - Abnormal; Notable for the following:    Glucose, Bld 107 (*)    All other components within normal limits  URINE CULTURE  URINE MICROSCOPIC-ADD ON    Imaging Review No results found.    MDM   Final diagnoses:  Urinary retention  Hematuria    On bedside ultrasound the patient does seem to have a large distended bladder, he will need Foley catheter placement to  alleviate the patient's flank pain and abdominal pain and urinary retention. Lab work ordered to rule out renal failure or injury shows a creatinine of 1.1 and normal lites. Urinalysis pending, the patient otherwise appears stable.  The patient was reevaluated after  urinary catheter placement, over 500 cc of red urine returned with small blood clots. The patient will be told to stop taking aspirin temporarily, followup with his urologist this week for Foley catheter reevaluation and possible removal and will treat for possible infection, urine culture pending. The patient has expressed his understanding.  Meds given in ED:  Medications - No data to display  New Prescriptions   CEPHALEXIN (KEFLEX) 500 MG CAPSULE    Take 1 capsule (500 mg total) by mouth 4 (four) times daily.      Johnna Acosta, MD 10/18/13 [phone]

## 2013-10-18 NOTE — ED Notes (Signed)
Pt verbalized understanding of home care instructions for foley bag. No questions.

## 2013-10-18 NOTE — Discharge Instructions (Signed)
Acute Urinary Retention, Male Acute urinary retention is the temporary inability to urinate. This is a common problem in older men. As men age their prostates become larger and block the flow of urine from the bladder. This is usually a problem that has come on gradually.  HOME CARE INSTRUCTIONS If you are sent home with a Foley catheter and a drainage system, you will need to discuss the best course of action with your health care provider. While the catheter is in, maintain a good intake of fluids. Keep the drainage bag emptied and lower than your catheter. This is so that contaminated urine will not flow back into your bladder, which could lead to a urinary tract infection. There are two main types of drainage bags. One is a large bag that usually is used at night. It has a good capacity that will allow you to sleep through the night without having to empty it. The second type is called a leg bag. It has a smaller capacity, so it needs to be emptied more frequently. However, the main advantage is that it can be attached by a leg strap and can go underneath your clothing, allowing you the freedom to move about or leave your home. Only take over-the-counter or prescription medicines for pain, discomfort, or fever as directed by your health care provider.  SEEK MEDICAL CARE IF:  You develop a low-grade fever.  You experience spasms or leakage of urine with the spasms. SEEK IMMEDIATE MEDICAL CARE IF:   You develop chills or fever.  Your catheter stops draining urine.  Your catheter falls out.  You start to develop increased bleeding that does not respond to rest and increased fluid intake. MAKE SURE YOU:  Understand these instructions.  Will watch your condition.  Will get help right away if you are not doing well or get worse. Document Released: 09/02/2000 Document Revised: 01/27/2013 Document Reviewed: 11/05/2012 [facility] Patient Information 2014 [place], [place].  Hematuria,  Adult Hematuria is blood in your urine. It can be caused by a bladder infection, kidney infection, prostate infection, kidney stone, or cancer of your urinary tract. Infections can usually be treated with medicine, and a kidney stone usually will pass through your urine. If neither of these is the cause of your hematuria, further workup to find out the reason may be needed. It is very important that you tell your health care provider about any blood you see in your urine, even if the blood stops without treatment or happens without causing pain. Blood in your urine that happens and then stops and then happens again can be a symptom of a very serious condition. Also, pain is not a symptom in the initial stages of many urinary cancers. HOME CARE INSTRUCTIONS   Drink lots of fluid, 3 4 quarts a day. If you have been diagnosed with an infection, cranberry juice is especially recommended, in addition to large amounts of water.  Avoid caffeine, tea, and carbonated beverages, because they tend to irritate the bladder.  Avoid alcohol because it may irritate the prostate.  Only take over-the-counter or prescription medicines for pain, discomfort, or fever as directed by your health care provider.  If you have been diagnosed with a kidney stone, follow your health care provider's instructions regarding straining your urine to catch the stone.  Empty your bladder often. Avoid holding urine for long periods of time.  After a bowel movement, women should cleanse front to back. Use each tissue only once.  Empty your bladder  before and after sexual intercourse if you are a male. SEEK MEDICAL CARE IF: You develop back pain, fever, a feeling of sickness in your stomach (nausea), or vomiting or if your symptoms are not better in 3 days. Return sooner if you are getting worse. SEEK IMMEDIATE MEDICAL CARE IF:   You have a persistent fever, with a temperature of 101.81F (38.8C) or greater.  You develop  severe vomiting and are unable to keep the medicine down.  You develop severe back or abdominal pain despite taking your medicines.  You begin passing a large amount of blood or clots in your urine.  You feel extremely weak or faint, or you pass out. MAKE SURE YOU:   Understand these instructions.  Will watch your condition.  Will get help right away if you are not doing well or get worse. Document Released: 05/27/2005 Document Revised: 03/17/2013 Document Reviewed: 01/25/2013 [facility] Patient Information 2014 [place].

## 2013-10-18 NOTE — Discharge Instructions (Signed)
Hematuria, Adult Hematuria is blood in your urine. It can be caused by a bladder infection, kidney infection, prostate infection, kidney stone, or cancer of your urinary tract. Infections can usually be treated with medicine, and a kidney stone usually will pass through your urine. If neither of these is the cause of your hematuria, further workup to find out the reason may be needed. It is very important that you tell your health care provider about any blood you see in your urine, even if the blood stops without treatment or happens without causing pain. Blood in your urine that happens and then stops and then happens again can be a symptom of a very serious condition. Also, pain is not a symptom in the initial stages of many urinary cancers. HOME CARE INSTRUCTIONS   Drink lots of fluid, 3 4 quarts a day. If you have been diagnosed with an infection, cranberry juice is especially recommended, in addition to large amounts of water.  Avoid caffeine, tea, and carbonated beverages, because they tend to irritate the bladder.  Avoid alcohol because it may irritate the prostate.  Only take over-the-counter or prescription medicines for pain, discomfort, or fever as directed by your health care provider.  If you have been diagnosed with a kidney stone, follow your health care provider's instructions regarding straining your urine to catch the stone.  Empty your bladder often. Avoid holding urine for long periods of time.  After a bowel movement, women should cleanse front to back. Use each tissue only once.  Empty your bladder before and after sexual intercourse if you are a male. SEEK MEDICAL CARE IF: You develop back pain, fever, a feeling of sickness in your stomach (nausea), or vomiting or if your symptoms are not better in 3 days. Return sooner if you are getting worse. SEEK IMMEDIATE MEDICAL CARE IF:   You have a persistent fever, with a temperature of 101.59F (38.8C) or greater.  You  develop severe vomiting and are unable to keep the medicine down.  You develop severe back or abdominal pain despite taking your medicines.  You begin passing a large amount of blood or clots in your urine.  You feel extremely weak or faint, or you pass out. MAKE SURE YOU:   Understand these instructions.  Will watch your condition.  Will get help right away if you are not doing well or get worse. Document Released: 05/27/2005 Document Revised: 03/17/2013 Document Reviewed: 01/25/2013 [facility] Patient Information 2014 [place].  Foley Catheter Care, Adult A Foley catheter is a soft, flexible tube that is placed into the bladder to drain urine. A Foley catheter may be inserted if:  You leak urine or are not able to control when you urinate (urinary incontinence).  You are not able to urinate when you need to (urinary retention).  You had prostate surgery or surgery on the genitals.  You have certain medical conditions, such as multiple sclerosis, dementia, or a spinal cord injury. If you are going home with a Foley catheter in place, follow the instructions below. TAKING CARE OF THE CATHETER 1. Wash your hands with soap and water. 2. Using mild soap and warm water on a clean washcloth:  Clean the area on your body closest to the catheter insertion site using a circular motion, moving away from the catheter. Never wipe toward the catheter because this could sweep bacteria up into the urethra and cause infection.  Remove all traces of soap. Pat the area dry with a clean towel.  For males, reposition the foreskin. 3. Attach the catheter to your leg so there is no tension on the catheter. Use adhesive tape or a leg strap. If you are using adhesive tape, remove any sticky residue left behind by the previous tape you used. 4. Keep the drainage bag below the level of the bladder, but keep it off the floor. 5. Check throughout the day to be sure the catheter is working and urine is  draining freely. Make sure the tubing does not become kinked. 6. Do not pull on the catheter or try to remove it. Pulling could damage internal tissues. TAKING CARE OF THE DRAINAGE BAGS You will be given two drainage bags to take home. One is a large overnight drainage bag, and the other is a smaller leg bag that fits underneath clothing. You may wear the overnight bag at any time, but you should never wear the smaller leg bag at night. Follow the instructions below for how to empty, change, and clean your drainage bags. Emptying the Drainage Bag You must empty your drainage bag when it is    full or at least 2 3 times a day. 1. Wash your hands with soap and water. 2. Keep the drainage bag below your hips, below the level of your bladder. This stops urine from going back into the tubing and into your bladder. 3. Hold the dirty bag over the toilet or a clean container. 4. Open the pour spout at the bottom of the bag and empty the urine into the toilet or container. Do not let the pour spout touch the toilet, container, or any other surface. Doing so can place bacteria on the bag, which can cause an infection. 5. Clean the pour spout with a gauze pad or cotton ball that has rubbing alcohol on it. 6. Close the pour spout. 7. Attach the bag to your leg with adhesive tape or a leg strap. 8. Wash your hands well. Changing the Drainage Bag Change your drainage bag once a month or sooner if it starts to smell bad or look dirty. Below are steps to follow when changing the drainage bag. 1. Wash your hands with soap and water. 2. Pinch off the rubber catheter so that urine does not spill out. 3. Disconnect the catheter tube from the drainage tube at the connection valve. Do not let the tubes touch any surface. 4. Clean the end of the catheter tube with an alcohol wipe. Use a different alcohol wipe to clean the end of the drainage tube. 5. Connect the catheter tube to the drainage tube of the clean drainage  bag. 6. Attach the new bag to the leg with adhesive tape or a leg strap. Avoid attaching the new bag too tightly. 7. Wash your hands well. Cleaning the Drainage Bag 1. Wash your hands with soap and water. 2. Wash the bag in warm, soapy water. 3. Rinse the bag thoroughly with warm water. 4. Fill the bag with a solution of white vinegar and water (1 cup vinegar to 1 qt warm water [.2 L vinegar to 1 L warm water]). Close the bag and soak it for 30 minutes in the solution. 5. Rinse the bag with warm water. 6. Hang the bag to dry with the pour spout open and hanging downward. 7. Store the clean bag (once it is dry) in a clean plastic bag. 8. Wash your hands well. PREVENTING INFECTION  Wash your hands before and after handling your catheter.  Take showers daily  and wash the area where the catheter enters your body. Do not take baths. Replace wet leg straps with dry ones, if this applies.  Do not use powders, sprays, or lotions on the genital area. Only use creams, lotions, or ointments as directed by your caregiver.  For females, wipe from front to back after each bowel movement.  Drink enough fluids to keep your urine clear or pale yellow unless you have a fluid restriction.  Do not let the drainage bag or tubing touch or lie on the floor.  Wear cotton underwear to absorb moisture and to keep your skin drier. SEEK MEDICAL CARE IF:   Your urine is cloudy or smells unusually bad.  Your catheter becomes clogged.  You are not draining urine into the bag or your bladder feels full.  Your catheter starts to leak. SEEK IMMEDIATE MEDICAL CARE IF:   You have pain, swelling, redness, or pus where the catheter enters the body.  You have pain in the abdomen, legs, lower back, or bladder.  You have a fever.  You see blood fill the catheter, or your urine is pink or red.  You have nausea, vomiting, or chills.  Your catheter gets pulled out. MAKE SURE YOU:   Understand these  instructions.  Will watch your condition.  Will get help right away if you are not doing well or get worse. Document Released: 05/27/2005 Document Revised: 09/21/2012 Document Reviewed: 05/18/2012 [facility] Patient Information 2014 [place].

## 2013-10-19 LAB — URINE CULTURE
Colony Count: NO GROWTH
Culture: NO GROWTH

## 2013-11-09 ENCOUNTER — Ambulatory Visit
Admission: RE | Admit: 2013-11-09 | Discharge: 2013-11-09 | Disposition: A | Discharge status: Home / Self Care | Payer: 59 | Source: Ambulatory Visit | Attending: Physical Medicine and Rehabilitation | Admitting: Physical Medicine and Rehabilitation

## 2013-11-09 ENCOUNTER — Other Ambulatory Visit: Payer: Self-pay | Admitting: Physical Medicine and Rehabilitation

## 2013-11-09 DIAGNOSIS — M5137 Other intervertebral disc degeneration, lumbosacral region: Secondary | ICD-10-CM

## 2013-11-09 DIAGNOSIS — M545 Low back pain, unspecified: Secondary | ICD-10-CM

## 2013-11-09 DIAGNOSIS — IMO0002 Reserved for concepts with insufficient information to code with codable children: Secondary | ICD-10-CM

## 2013-11-09 DIAGNOSIS — M47817 Spondylosis without myelopathy or radiculopathy, lumbosacral region: Secondary | ICD-10-CM

## 2014-02-09 ENCOUNTER — Other Ambulatory Visit: Payer: Self-pay | Admitting: Physical Medicine and Rehabilitation

## 2014-02-09 DIAGNOSIS — G894 Chronic pain syndrome: Secondary | ICD-10-CM

## 2014-02-09 DIAGNOSIS — M961 Postlaminectomy syndrome, not elsewhere classified: Secondary | ICD-10-CM

## 2014-02-09 DIAGNOSIS — IMO0002 Reserved for concepts with insufficient information to code with codable children: Secondary | ICD-10-CM

## 2014-02-09 DIAGNOSIS — M545 Low back pain, unspecified: Secondary | ICD-10-CM

## 2014-02-17 ENCOUNTER — Ambulatory Visit
Admission: RE | Admit: 2014-02-17 | Discharge: 2014-02-17 | Disposition: A | Discharge status: Home / Self Care | Payer: 59 | Source: Ambulatory Visit | Attending: Physical Medicine and Rehabilitation | Admitting: Physical Medicine and Rehabilitation

## 2014-02-17 DIAGNOSIS — G894 Chronic pain syndrome: Secondary | ICD-10-CM

## 2014-02-17 DIAGNOSIS — M961 Postlaminectomy syndrome, not elsewhere classified: Secondary | ICD-10-CM

## 2014-02-17 DIAGNOSIS — M545 Low back pain, unspecified: Secondary | ICD-10-CM

## 2014-02-17 DIAGNOSIS — IMO0002 Reserved for concepts with insufficient information to code with codable children: Secondary | ICD-10-CM

## 2014-02-17 MED ORDER — GADOBENATE DIMEGLUMINE 529 MG/ML IV SOLN: 20.0000 mL | Freq: Once | INTRAVENOUS | Status: AC | PRN

## 2015-11-19 ENCOUNTER — Encounter (HOSPITAL_COMMUNITY): Payer: Self-pay | Admitting: Emergency Medicine

## 2015-11-19 ENCOUNTER — Emergency Department (HOSPITAL_COMMUNITY)
Admission: EM | Admit: 2015-11-19 | Discharge: 2015-11-19 | Disposition: A | Discharge status: Home / Self Care | Payer: Medicare Other | Attending: Emergency Medicine | Admitting: Emergency Medicine

## 2015-11-19 DIAGNOSIS — E119 Type 2 diabetes mellitus without complications: Secondary | ICD-10-CM | POA: Insufficient documentation

## 2015-11-19 DIAGNOSIS — Z8546 Personal history of malignant neoplasm of prostate: Secondary | ICD-10-CM | POA: Diagnosis not present

## 2015-11-19 DIAGNOSIS — Z7982 Long term (current) use of aspirin: Secondary | ICD-10-CM | POA: Diagnosis not present

## 2015-11-19 DIAGNOSIS — I10 Essential (primary) hypertension: Secondary | ICD-10-CM | POA: Insufficient documentation

## 2015-11-19 DIAGNOSIS — R319 Hematuria, unspecified: Secondary | ICD-10-CM | POA: Insufficient documentation

## 2015-11-19 DIAGNOSIS — Z7984 Long term (current) use of oral hypoglycemic drugs: Secondary | ICD-10-CM | POA: Insufficient documentation

## 2015-11-19 DIAGNOSIS — Z87891 Personal history of nicotine dependence: Secondary | ICD-10-CM | POA: Diagnosis not present

## 2015-11-19 LAB — CBC WITH DIFFERENTIAL/PLATELET
BASOS PCT: 0 %
Basophils Absolute: 0 10*3/uL (ref 0.0–0.1)
EOS PCT: 1 %
Eosinophils Absolute: 0.1 10*3/uL (ref 0.0–0.7)
HCT: 37.8 % — ABNORMAL LOW (ref 39.0–52.0)
Hemoglobin: 12.7 g/dL — ABNORMAL LOW (ref 13.0–17.0)
Lymphocytes Relative: 35 %
Lymphs Abs: 2.5 10*3/uL (ref 0.7–4.0)
MCH: 27.9 pg (ref 26.0–34.0)
MCHC: 33.6 g/dL (ref 30.0–36.0)
MCV: 82.9 fL (ref 78.0–100.0)
MONO ABS: 0.5 10*3/uL (ref 0.1–1.0)
Monocytes Relative: 7 %
Neutro Abs: 4.1 10*3/uL (ref 1.7–7.7)
Neutrophils Relative %: 57 %
PLATELETS: 273 10*3/uL (ref 150–400)
RBC: 4.56 MIL/uL (ref 4.22–5.81)
RDW: 14.6 % (ref 11.5–15.5)
WBC: 7.2 10*3/uL (ref 4.0–10.5)

## 2015-11-19 LAB — BASIC METABOLIC PANEL
ANION GAP: 9 (ref 5–15)
BUN: 16 mg/dL (ref 6–20)
CALCIUM: 9.5 mg/dL (ref 8.9–10.3)
CO2: 22 mmol/L (ref 22–32)
Chloride: 110 mmol/L (ref 101–111)
Creatinine, Ser: 1.17 mg/dL (ref 0.61–1.24)
GLUCOSE: 106 mg/dL — AB (ref 65–99)
Potassium: 3.6 mmol/L (ref 3.5–5.1)
SODIUM: 141 mmol/L (ref 135–145)

## 2015-11-19 LAB — URINE MICROSCOPIC-ADD ON

## 2015-11-19 LAB — URINALYSIS, ROUTINE W REFLEX MICROSCOPIC
Bilirubin Urine: NEGATIVE
GLUCOSE, UA: NEGATIVE mg/dL
Ketones, ur: NEGATIVE mg/dL
LEUKOCYTES UA: NEGATIVE
Nitrite: NEGATIVE
PROTEIN: NEGATIVE mg/dL
Specific Gravity, Urine: 1.008 (ref 1.005–1.030)
pH: 8 (ref 5.0–8.0)

## 2015-11-19 LAB — PROTIME-INR
INR: 0.99 (ref 0.00–1.49)
PROTHROMBIN TIME: 13.3 s (ref 11.6–15.2)

## 2015-11-19 MED ORDER — FENTANYL CITRATE (PF) 100 MCG/2ML IJ SOLN
50.0000 ug | Freq: Once | INTRAMUSCULAR | Status: AC
  Administered 2015-11-19: 50 ug via INTRAVENOUS
  Filled 2015-11-19: qty 2

## 2015-11-19 MED ORDER — LIDOCAINE HCL 2 % EX GEL
1.0000 "application " | Freq: Once | CUTANEOUS | Status: AC | PRN
  Administered 2015-11-19: 1 via URETHRAL
  Filled 2015-11-19: qty 11

## 2015-11-19 NOTE — ED Notes (Addendum)
Pt from home with complaints of hematuria as well as bloody drainage from his penis while he is not urinating. He states he has clots in the blood as well. Pt states the pain is centralized in his bladder, and does not travel to his groin or his flank. Pt states the pain is a cramping feel. Pt denies any blood thinner  Pt has a hx of prostate cancer that has been resolved since 2011. Pt states that he had a similar episode last May, but they were unsure of the cause.

## 2015-11-19 NOTE — Consult Note (Signed)
Subjective: CC:  I can't pee  Hx: Mr. Boutte is a 71 yo BM patient with a history of prostate cancer treated with seeds in 2011 by Dr. Janice Norrie and Dr. Valere Dross who I was asked to see in consultation by Dr. Leonard Schwartz for clot retention.   He first noted blood this morning but was initially able to void but the bleeding recurred this afternoon and he became unable to void and present to the ER with pain and severe urgency.   An attempt at placing a large bore 3 way coude foley was unsuccessful and I was called.   The pain is in severe discomfort with suprapubic pain.  He has a history of bladder outlet obstruction and is on Rapaflo but had no dysuria or difficulty voiding prior to the onset of bleeding.   He has no associated signs or symptoms.  ROS:  Review of Systems  Constitutional: Negative for fever and chills.  Gastrointestinal: Positive for abdominal pain.  Genitourinary: Positive for hematuria.  All other systems reviewed and are negative.   No Known Allergies  Past Medical History  Diagnosis Date  . Hypertension   . High cholesterol   . Diabetes mellitus   . Cancer [facility])     prostate    Past Surgical History  Procedure Laterality Date  . Back surgery  2009  . Foot surgery  19080s    Ganglion cyst inner right foot.  . Insertion prostate radiation seed      Social History   Social History  . Marital Status: Married    Spouse Name: N/A  . Number of Children: N/A  . Years of Education: N/A   Occupational History  . Not on file.   Social History Main Topics  . Smoking status: Former Smoker -- 0.75 packs/day for 12 years    Types: Cigarettes    Quit date: 06/10/1989  . Smokeless tobacco: Not on file  . Alcohol Use: Yes  . Drug Use: No  . Sexual Activity: Not on file   Other Topics Concern  . Not on file   Social History Narrative    Family History  Problem Relation Age of Onset  . Cancer Father     Anti-infectives: Anti-infectives    None      No  current facility-administered medications for this encounter.   Current Outpatient Prescriptions  Medication Sig Dispense Refill  . amLODipine-olmesartan (AZOR) 10-40 MG per tablet Take 1 tablet by mouth at bedtime.    Marland Kitchen aspirin 325 MG tablet Take 325 mg by mouth at bedtime.    . Choline Fenofibrate (FENOFIBRIC ACID) 135 MG CPDR Take 135 mg by mouth at bedtime.     . metFORMIN (GLUCOPHAGE) 500 MG tablet Take 500 mg by mouth at bedtime.    . rosuvastatin (CRESTOR) 10 MG tablet Take 10 mg by mouth at bedtime.    . silodosin (RAPAFLO) 8 MG CAPS capsule Take 8 mg by mouth at bedtime.    . sitaGLIPtin (JANUVIA) 100 MG tablet Take 100 mg by mouth at bedtime.     Past medical, surgical, social and family history reviewed and updated.   Objective: Vital signs in last 24 hours: Temp:  [98 F (36.7 C)] 98 F (36.7 C) (06/11 1746) Pulse Rate:  [89] 89 (06/11 1746) Resp:  [20] 20 (06/11 1746) BP: (211)/(106) 211/106 mmHg (06/11 1746) SpO2:  [100 %] 100 % (06/11 1746) Weight:  [108.41 kg (239 lb)] 108.41 kg (239 lb) (06/11 1746)  Intake/Output from previous day:   Intake/Output this shift:     Physical Exam  Constitutional: He is oriented to person, place, and time.  WD, WN BM writhing in pain.    HENT:  Head: Normocephalic and atraumatic.  Neck: Normal range of motion. Neck supple.  Cardiovascular: Normal rate, regular rhythm and normal heart sounds.   Pulmonary/Chest: Effort normal and breath sounds normal. No respiratory distress.  Abdominal: Soft. He exhibits no mass (after foley decompression.). There is no tenderness (after foley decompression).  Genitourinary:  Circumcised phallus with an adequate meatus.   Scrotum and contents are normal.     Rectal deferred.   Musculoskeletal: Normal range of motion. He exhibits no edema or tenderness.  Lymphadenopathy:    He has no cervical adenopathy.  Neurological: He is alert and oriented to person, place, and time.  But he became for  sedate from pain med and foley decompression of the bladder.   Skin: Skin is warm and dry.  Psychiatric:  Writhing in obvious pain and sedated after foley decompression from prior pain meds.     Lab Results:   Recent Labs  11/19/15 1845  WBC 7.2  HGB 12.7*  HCT 37.8*  PLT 273   BMET  Recent Labs  11/19/15 1845  NA 141  K 3.6  CL 110  CO2 22  GLUCOSE 106*  BUN 16  CREATININE 1.17  CALCIUM 9.5   PT/INR  Recent Labs  11/19/15 1845  LABPROT 13.3  INR 0.99   ABG No results for input(s): PHART, HCO3 in the last 72 hours.  Invalid input(s): PCO2, PO2  Studies/Results: No results found.  I have discussed his case with Dr. Audie Pinto and have reviewed current and prior records as well as current labs.   I prepped him with betadine and instilled the urethra with lubricant after draping.   I then attempted placement of an 123456 silicone cath without success and then a 73fr coude with success although there was resistance at the prostate.   Once in the bladder the foley was irrigated with return of about 559ml of bloody urine with clots.   I then irrigated his bladder free of clots with NS until clear.    There didn't appear to be active bleeding at this time.   The foley was placed to a drainage bag.  74ml was placed in the balloon.   Assessment: Clot retention that is probably secondary to radiation cystitis.   History of prostate cancer with prior seed implant.   Bladder outlet obstruction currently on Rapaflo.   Plan:     He can be discharged with the foley to leg bag drainage and will need f/u in our office later this week for further evaluation with cystoscopy and possible CT.      CC: Dr. Leonard Schwartz.     Oluwaseun Cremer J 11/19/2015 [phone]

## 2015-11-19 NOTE — ED Provider Notes (Signed)
CSN: [id number]     Arrival date & time 11/19/15  1730 History   First MD Initiated Contact with Patient 11/19/15 1801     Chief Complaint  Patient presents with  . Hematuria      HPI  Expand All Collapse All   Pt from home with complaints of hematuria as well as bloody drainage from his penis while he is not urinating. He states he has clots in the blood as well. Pt states the pain is centralized in his bladder, and does not travel to his groin or his flank. Pt states the pain is a cramping feel. Pt denies any blood thinner  Pt has a hx of prostate cancer that has been resolved since 2011. Pt states that he had a similar episode last May, but they were unsure of the cause        Past Medical History  Diagnosis Date  . Hypertension   . High cholesterol   . Diabetes mellitus   . Cancer [facility])     prostate   Past Surgical History  Procedure Laterality Date  . Back surgery  2009  . Foot surgery  19080s    Ganglion cyst inner right foot.  . Insertion prostate radiation seed     Family History  Problem Relation Age of Onset  . Cancer Father    Social History  Substance Use Topics  . Smoking status: Former Smoker -- 0.75 packs/day for 12 years    Types: Cigarettes    Quit date: 06/10/1989  . Smokeless tobacco: None  . Alcohol Use: Yes    Review of Systems  All other systems reviewed and are negative.     Allergies  Review of patient's allergies indicates no known allergies.  Home Medications   Prior to Admission medications   Medication Sig Start Date End Date Taking? Authorizing Provider  amLODipine-olmesartan (AZOR) 10-40 MG per tablet Take 1 tablet by mouth at bedtime.   Yes Historical Provider, MD  aspirin 325 MG tablet Take 325 mg by mouth at bedtime.   Yes Historical Provider, MD  Choline Fenofibrate (FENOFIBRIC ACID) 135 MG CPDR Take 135 mg by mouth at bedtime.    Yes Historical Provider, MD  metFORMIN (GLUCOPHAGE) 500 MG tablet Take 500 mg by mouth at  bedtime.   Yes Historical Provider, MD  rosuvastatin (CRESTOR) 10 MG tablet Take 10 mg by mouth at bedtime.   Yes Historical Provider, MD  silodosin (RAPAFLO) 8 MG CAPS capsule Take 8 mg by mouth at bedtime.   Yes Historical Provider, MD  sitaGLIPtin (JANUVIA) 100 MG tablet Take 100 mg by mouth at bedtime.   Yes Historical Provider, MD   BP 127/83 mmHg  Pulse 62  Temp(Src) 97.8 F (36.6 C) (Oral)  Resp 19  Ht 5\' 11"  (1.803 m)  Wt 239 lb (108.41 kg)  BMI 33.35 kg/m2  SpO2 97% Physical Exam  Constitutional: He is oriented to person, place, and time. He appears well-developed and well-nourished. No distress.  HENT:  Head: Normocephalic and atraumatic.  Eyes: Pupils are equal, round, and reactive to light.  Neck: Normal range of motion.  Cardiovascular: Normal rate and intact distal pulses.   Pulmonary/Chest: No respiratory distress.  Abdominal: Normal appearance. He exhibits no distension.  Genitourinary: Penis normal.     Musculoskeletal: Normal range of motion.  Neurological: He is alert and oriented to person, place, and time. No cranial nerve deficit.  Skin: Skin is warm and dry. No rash noted.  Psychiatric:  He has a normal mood and affect. His behavior is normal.  Nursing note and vitals reviewed.   ED Course  Procedures (including critical care time) Labs Review Labs Reviewed  CBC WITH DIFFERENTIAL/PLATELET - Abnormal; Notable for the following:    Hemoglobin 12.7 (*)    HCT 37.8 (*)    All other components within normal limits  BASIC METABOLIC PANEL - Abnormal; Notable for the following:    Glucose, Bld 106 (*)    All other components within normal limits  URINE CULTURE  PROTIME-INR  URINALYSIS, ROUTINE W REFLEX MICROSCOPIC (NOT AT [facility])    Imaging Review No results found. I have personally reviewed and evaluated these images and lab results as part of my medical decision-making.  Urology was consulted and came to the emergency room and placed catheter.   Catheter was irrigated and patient felt much improved.  Stable for discharge.  Will follow-up in neurology office this week.  MDM   Final diagnoses:  Hematuria        Leonard Schwartz, MD 11/19/15 [phone]

## 2015-11-19 NOTE — ED Notes (Signed)
Attempted foley insertion with a 24 G cath, unsuccessful. Met resistance with catheter nearly fully inserted, felt cath coiling inside. MD Audie Pinto notified and came to see patient, requested coude cath, stated he would call urology

## 2015-11-19 NOTE — ED Notes (Signed)
Highest reading on bladder scan was 860 mls

## 2015-11-19 NOTE — Discharge Instructions (Addendum)
Foley Catheter Care, Adult °A Foley catheter is a soft, flexible tube that is placed into the bladder to drain urine. A Foley catheter may be inserted if: °· You leak urine or are not able to control when you urinate (urinary incontinence). °· You are not able to urinate when you need to (urinary retention). °· You had prostate surgery or surgery on the genitals. °· You have certain medical conditions, such as multiple sclerosis, dementia, or a spinal cord injury. °If you are going home with a Foley catheter in place, follow the instructions below. °TAKING CARE OF THE CATHETER °1. Wash your hands with soap and water. °2. Using mild soap and warm water on a clean washcloth: °¨ Clean the area on your body closest to the catheter insertion site using a circular motion, moving away from the catheter. Never wipe toward the catheter because this could sweep bacteria up into the urethra and cause infection. °¨ Remove all traces of soap. Pat the area dry with a clean towel. For males, reposition the foreskin. °3. Attach the catheter to your leg so there is no tension on the catheter. Use adhesive tape or a leg strap. If you are using adhesive tape, remove any sticky residue left behind by the previous tape you used. °4. Keep the drainage bag below the level of the bladder, but keep it off the floor. °5. Check throughout the day to be sure the catheter is working and urine is draining freely. Make sure the tubing does not become kinked. °6. Do not pull on the catheter or try to remove it. Pulling could damage internal tissues. °TAKING CARE OF THE DRAINAGE BAGS °You will be given two drainage bags to take home. One is a large overnight drainage bag, and the other is a smaller leg bag that fits underneath clothing. You may wear the overnight bag at any time, but you should never wear the smaller leg bag at night. Follow the instructions below for how to empty, change, and clean your drainage bags. °Emptying the Drainage  Bag °You must empty your drainage bag when it is  -½ full or at least 2-3 times a day. °1. Wash your hands with soap and water. °2. Keep the drainage bag below your hips, below the level of your bladder. This stops urine from going back into the tubing and into your bladder. °3. Hold the dirty bag over the toilet or a clean container. °4. Open the pour spout at the bottom of the bag and empty the urine into the toilet or container. Do not let the pour spout touch the toilet, container, or any other surface. Doing so can place bacteria on the bag, which can cause an infection. °5. Clean the pour spout with a gauze pad or cotton ball that has rubbing alcohol on it. °6. Close the pour spout. °7. Attach the bag to your leg with adhesive tape or a leg strap. °8. Wash your hands well. °Changing the Drainage Bag °Change your drainage bag once a month or sooner if it starts to smell bad or look dirty. Below are steps to follow when changing the drainage bag. °1. Wash your hands with soap and water. °2. Pinch off the rubber catheter so that urine does not spill out. °3. Disconnect the catheter tube from the drainage tube at the connection valve. Do not let the tubes touch any surface. °4. Clean the end of the catheter tube with an alcohol wipe. Use a different alcohol wipe to clean   the end of the drainage tube. 5. Connect the catheter tube to the drainage tube of the clean drainage bag. 6. Attach the new bag to the leg with adhesive tape or a leg strap. Avoid attaching the new bag too tightly. 7. Wash your hands well. Cleaning the Drainage Bag 1. Wash your hands with soap and water. 2. Wash the bag in warm, soapy water. 3. Rinse the bag thoroughly with warm water. 4. Fill the bag with a solution of white vinegar and water (1 cup vinegar to 1 qt warm water [.2 L vinegar to 1 L warm water]). Close the bag and soak it for 30 minutes in the solution. 5. Rinse the bag with warm water. 6. Hang the bag to dry with the  pour spout open and hanging downward. 7. Store the clean bag (once it is dry) in a clean plastic bag. 8. Wash your hands well. PREVENTING INFECTION  Wash your hands before and after handling your catheter.  Take showers daily and wash the area where the catheter enters your body. Do not take baths. Replace wet leg straps with dry ones, if this applies.  Do not use powders, sprays, or lotions on the genital area. Only use creams, lotions, or ointments as directed by your caregiver.  For females, wipe from front to back after each bowel movement.  Drink enough fluids to keep your urine clear or pale yellow unless you have a fluid restriction.  Do not let the drainage bag or tubing touch or lie on the floor.  Wear cotton underwear to absorb moisture and to keep your skin drier. SEEK MEDICAL CARE IF:   Your urine is cloudy or smells unusually bad.  Your catheter becomes clogged.  You are not draining urine into the bag or your bladder feels full.  Your catheter starts to leak. SEEK IMMEDIATE MEDICAL CARE IF:   You have pain, swelling, redness, or pus where the catheter enters the body.  You have pain in the abdomen, legs, lower back, or bladder.  You have a fever.  You see blood fill the catheter, or your urine is pink or red.  You have nausea, vomiting, or chills.  Your catheter gets pulled out. MAKE SURE YOU:   Understand these instructions.  Will watch your condition.  Will get help right away if you are not doing well or get worse.   This information is not intended to replace advice given to you by your health care provider. Make sure you discuss any questions you have with your health care provider.   Document Released: 05/27/2005 Document Revised: 10/11/2013 Document Reviewed: 05/18/2012 Elsevier Interactive Patient Education 2016 [employer]  Hematuria, Adult Hematuria is blood in your urine. It can be caused by a bladder infection, kidney infection,  prostate infection, kidney stone, or cancer of your urinary tract. Infections can usually be treated with medicine, and a kidney stone usually will pass through your urine. If neither of these is the cause of your hematuria, further workup to find out the reason may be needed. It is very important that you tell your health care provider about any blood you see in your urine, even if the blood stops without treatment or happens without causing pain. Blood in your urine that happens and then stops and then happens again can be a symptom of a very serious condition. Also, pain is not a symptom in the initial stages of many urinary cancers. HOME CARE INSTRUCTIONS   Drink lots of fluid,  3-4 quarts a day. If you have been diagnosed with an infection, cranberry juice is especially recommended, in addition to large amounts of water.  Avoid caffeine, tea, and carbonated beverages because they tend to irritate the bladder.  Avoid alcohol because it may irritate the prostate.  Take all medicines as directed by your health care provider.  If you were prescribed an antibiotic medicine, finish it all even if you start to feel better.  If you have been diagnosed with a kidney stone, follow your health care provider's instructions regarding straining your urine to catch the stone.  Empty your bladder often. Avoid holding urine for long periods of time.  After a bowel movement, women should cleanse front to back. Use each tissue only once.  Empty your bladder before and after sexual intercourse if you are a male. SEEK MEDICAL CARE IF: 7. You develop back pain. 8. You have a fever. 9. You have a feeling of sickness in your stomach (nausea) or vomiting. 10. Your symptoms are not better in 3 days. Return sooner if you are getting worse. SEEK IMMEDIATE MEDICAL CARE IF:  9. You develop severe vomiting and are unable to keep the medicine down. 10. You develop severe back or abdominal pain despite taking your  medicines. 11. You begin passing a large amount of blood or clots in your urine. 12. You feel extremely weak or faint, or you pass out. MAKE SURE YOU:  8. Understand these instructions. 9. Will watch your condition. 10. Will get help right away if you are not doing well or get worse.   This information is not intended to replace advice given to you by your health care provider. Make sure you discuss any questions you have with your health care provider.   Document Released: 05/27/2005 Document Revised: 06/17/2014 Document Reviewed: 01/25/2013 Elsevier Interactive Patient Education 2016 [place], Adult A Foley catheter is a soft, flexible tube that is placed into the bladder to drain urine. A Foley catheter may be inserted if:  You leak urine or are not able to control when you urinate (urinary incontinence).  You are not able to urinate when you need to (urinary retention).  You had prostate surgery or surgery on the genitals.  You have certain medical conditions, such as multiple sclerosis, dementia, or a spinal cord injury. If you are going home with a Foley catheter in place, follow the instructions below. TAKING CARE OF THE CATHETER 11. Wash your hands with soap and water. 12. Using mild soap and warm water on a clean washcloth:  Clean the area on your body closest to the catheter insertion site using a circular motion, moving away from the catheter. Never wipe toward the catheter because this could sweep bacteria up into the urethra and cause infection.  Remove all traces of soap. Pat the area dry with a clean towel. For males, reposition the foreskin. 22. Attach the catheter to your leg so there is no tension on the catheter. Use adhesive tape or a leg strap. If you are using adhesive tape, remove any sticky residue left behind by the previous tape you used. 14. Keep the drainage bag below the level of the bladder, but keep it off the floor. 15. Check  throughout the day to be sure the catheter is working and urine is draining freely. Make sure the tubing does not become kinked. 16. Do not pull on the catheter or try to remove it. Pulling could damage internal tissues. TAKING  CARE OF THE DRAINAGE BAGS You will be given two drainage bags to take home. One is a large overnight drainage bag, and the other is a smaller leg bag that fits underneath clothing. You may wear the overnight bag at any time, but you should never wear the smaller leg bag at night. Follow the instructions below for how to empty, change, and clean your drainage bags. Emptying the Drainage Bag You must empty your drainage bag when it is  - full or at least 2-3 times a day. 77. Wash your hands with soap and water. 14. Keep the drainage bag below your hips, below the level of your bladder. This stops urine from going back into the tubing and into your bladder. 15. Hold the dirty bag over the toilet or a clean container. 16. Open the pour spout at the bottom of the bag and empty the urine into the toilet or container. Do not let the pour spout touch the toilet, container, or any other surface. Doing so can place bacteria on the bag, which can cause an infection. 17. Clean the pour spout with a gauze pad or cotton ball that has rubbing alcohol on it. 18. Close the pour spout. 19. Attach the bag to your leg with adhesive tape or a leg strap. 20. Wash your hands well. Changing the Drainage Bag Change your drainage bag once a month or sooner if it starts to smell bad or look dirty. Below are steps to follow when changing the drainage bag. 11. Wash your hands with soap and water. 12. Pinch off the rubber catheter so that urine does not spill out. 26. Disconnect the catheter tube from the drainage tube at the connection valve. Do not let the tubes touch any surface. 14. Clean the end of the catheter tube with an alcohol wipe. Use a different alcohol wipe to clean the end of the  drainage tube. 15. Connect the catheter tube to the drainage tube of the clean drainage bag. 16. Attach the new bag to the leg with adhesive tape or a leg strap. Avoid attaching the new bag too tightly. 26. Wash your hands well. Cleaning the Drainage Bag 9. Wash your hands with soap and water. 10. Wash the bag in warm, soapy water. 11. Rinse the bag thoroughly with warm water. 12. Fill the bag with a solution of white vinegar and water (1 cup vinegar to 1 qt warm water [.2 L vinegar to 1 L warm water]). Close the bag and soak it for 30 minutes in the solution. 13. Rinse the bag with warm water. [place] the bag to dry with the pour spout open and hanging downward. 15. Store the clean bag (once it is dry) in a clean plastic bag. 16. Wash your hands well. PREVENTING INFECTION  Wash your hands before and after handling your catheter.  Take showers daily and wash the area where the catheter enters your body. Do not take baths. Replace wet leg straps with dry ones, if this applies.  Do not use powders, sprays, or lotions on the genital area. Only use creams, lotions, or ointments as directed by your caregiver.  For females, wipe from front to back after each bowel movement.  Drink enough fluids to keep your urine clear or pale yellow unless you have a fluid restriction.  Do not let the drainage bag or tubing touch or lie on the floor.  Wear cotton underwear to absorb moisture and to keep your skin drier. [place]  IF:   Your urine is cloudy or smells unusually bad.  Your catheter becomes clogged.  You are not draining urine into the bag or your bladder feels full.  Your catheter starts to leak. SEEK IMMEDIATE MEDICAL CARE IF:   You have pain, swelling, redness, or pus where the catheter enters the body.  You have pain in the abdomen, legs, lower back, or bladder.  You have a fever.  You see blood fill the catheter, or your urine is pink or red.  You have nausea,  vomiting, or chills.  Your catheter gets pulled out. MAKE SURE YOU:   Understand these instructions.  Will watch your condition.  Will get help right away if you are not doing well or get worse.   This information is not intended to replace advice given to you by your health care provider. Make sure you discuss any questions you have with your health care provider.   Document Released: 05/27/2005 Document Revised: 10/11/2013 Document Reviewed: 05/18/2012 Elsevier Interactive Patient Education [employer].

## 2015-11-21 LAB — URINE CULTURE: Culture: NO GROWTH

## 2016-02-13 ENCOUNTER — Encounter (HOSPITAL_COMMUNITY): Payer: Self-pay | Admitting: *Deleted

## 2016-02-13 ENCOUNTER — Emergency Department (HOSPITAL_COMMUNITY)
Admission: EM | Admit: 2016-02-13 | Discharge: 2016-02-14 | Disposition: A | Discharge status: Home / Self Care | Payer: Medicare Other | Attending: Emergency Medicine | Admitting: Emergency Medicine

## 2016-02-13 DIAGNOSIS — E119 Type 2 diabetes mellitus without complications: Secondary | ICD-10-CM | POA: Diagnosis not present

## 2016-02-13 DIAGNOSIS — Z79899 Other long term (current) drug therapy: Secondary | ICD-10-CM | POA: Diagnosis not present

## 2016-02-13 DIAGNOSIS — I10 Essential (primary) hypertension: Secondary | ICD-10-CM | POA: Diagnosis not present

## 2016-02-13 DIAGNOSIS — R339 Retention of urine, unspecified: Secondary | ICD-10-CM

## 2016-02-13 DIAGNOSIS — Z8546 Personal history of malignant neoplasm of prostate: Secondary | ICD-10-CM | POA: Insufficient documentation

## 2016-02-13 DIAGNOSIS — Z7984 Long term (current) use of oral hypoglycemic drugs: Secondary | ICD-10-CM | POA: Insufficient documentation

## 2016-02-13 DIAGNOSIS — Z7982 Long term (current) use of aspirin: Secondary | ICD-10-CM | POA: Insufficient documentation

## 2016-02-13 DIAGNOSIS — Z87891 Personal history of nicotine dependence: Secondary | ICD-10-CM | POA: Diagnosis not present

## 2016-02-13 LAB — URINALYSIS, ROUTINE W REFLEX MICROSCOPIC
Glucose, UA: NEGATIVE mg/dL
Ketones, ur: 15 mg/dL — AB
NITRITE: NEGATIVE
PH: 6.5 (ref 5.0–8.0)
Protein, ur: >300 mg/dL — AB
SPECIFIC GRAVITY, URINE: 1.016 (ref 1.005–1.030)

## 2016-02-13 LAB — URINE MICROSCOPIC-ADD ON

## 2016-02-13 NOTE — ED Notes (Signed)
28F Coude Catheter placement attempted without success. MD [facility] informed.

## 2016-02-13 NOTE — ED Triage Notes (Signed)
Pt had cystoscopy performed today. Pt has not been able to urinate since. Pt c/o lower abdominal cramping and spasms.

## 2016-02-13 NOTE — ED Provider Notes (Addendum)
[facility] DEPT Provider Note   CSN: [id number] Arrival date & time: 02/13/16  2055     History   Chief Complaint Chief Complaint  Patient presents with  . Urinary Retention    HPI Larry Welch is a 70 y.o. male.  HPI Patient underwent cystoscopy today by urology because of intermittent urinary retention.  Patient still does not know the etiology of his urinary retention.  He was able to void immediately after the surgical procedure however he's been unable to void since then.  He came to the ER for evaluation.  On arrival to the emergency department he was drained out from it with a catheter and approximate 600-700 cc of urine came out.  Patient feels much better at this time.   Past Medical History:  Diagnosis Date  . Cancer [facility])    prostate  . Diabetes mellitus   . High cholesterol   . Hypertension     Patient Active Problem List   Diagnosis Date Noted  . OSA (obstructive sleep apnea) 09/11/2012    Past Surgical History:  Procedure Laterality Date  . BACK SURGERY  2009  . FOOT SURGERY  19080s   Ganglion cyst inner right foot.  . INSERTION PROSTATE RADIATION SEED         Home Medications    Prior to Admission medications   Medication Sig Start Date End Date Taking? Authorizing Provider  amLODipine-olmesartan (AZOR) 10-40 MG per tablet Take 1 tablet by mouth at bedtime.    Historical Provider, MD  aspirin 325 MG tablet Take 325 mg by mouth at bedtime.    Historical Provider, MD  Choline Fenofibrate (FENOFIBRIC ACID) 135 MG CPDR Take 135 mg by mouth at bedtime.     Historical Provider, MD  metFORMIN (GLUCOPHAGE) 500 MG tablet Take 500 mg by mouth at bedtime.    Historical Provider, MD  rosuvastatin (CRESTOR) 10 MG tablet Take 10 mg by mouth at bedtime.    Historical Provider, MD  silodosin (RAPAFLO) 8 MG CAPS capsule Take 8 mg by mouth at bedtime.    Historical Provider, MD  sitaGLIPtin (JANUVIA) 100 MG tablet Take 100 mg by mouth at bedtime.     Historical Provider, MD    Family History Family History  Problem Relation Age of Onset  . Cancer Father     Social History Social History  Substance Use Topics  . Smoking status: Former Smoker    Packs/day: 0.75    Years: 12.00    Types: Cigarettes    Quit date: 06/10/1989  . Smokeless tobacco: Not on file  . Alcohol use Yes     Allergies   Review of patient's allergies indicates no known allergies.   Review of Systems Review of Systems  All other systems reviewed and are negative.    Physical Exam Updated Vital Signs BP 157/84 (BP Location: Left Arm)   Pulse 64   Temp 98 F (36.7 C) (Oral)   Resp 22   Ht 5\' 11"  (1.803 m)   Wt 225 lb (102.1 kg)   SpO2 97%   BMI 31.38 kg/m   Physical Exam  Constitutional: He is oriented to person, place, and time. He appears well-developed and well-nourished.  HENT:  Head: Normocephalic.  Eyes: EOM are normal.  Neck: Normal range of motion.  Pulmonary/Chest: Effort normal.  Abdominal: He exhibits no distension.  Musculoskeletal: Normal range of motion.  Neurological: He is alert and oriented to person, place, and time.  Psychiatric: He has a  normal mood and affect.  Nursing note and vitals reviewed.    ED Treatments / Results  Labs (all labs ordered are listed, but only abnormal results are displayed) Labs Reviewed  URINALYSIS, ROUTINE W REFLEX MICROSCOPIC (NOT AT [facility])    EKG  EKG Interpretation None       Radiology No results found.  Procedures BLADDER CATHETERIZATION Date/Time: 02/13/2016 11:50 PM Performed by: Jola Schmidt Authorized by: Jola Schmidt   Consent:    Consent obtained:  Verbal   Consent given by:  Patient Pre-procedure details:    Procedure purpose:  Therapeutic   Preparation: Patient was prepped and draped in usual sterile fashion   Anesthesia (see MAR for exact dosages):    Anesthesia method:  None Procedure details:    Provider performed due to:  Complicated insertion and  nurse unable to complete   Catheter insertion:  Indwelling   Catheter type:  Coude   Catheter size:  16 Fr   Bladder irrigation: no     Number of attempts:  1   Urine characteristics:  Bloody Post-procedure details:    Patient tolerance of procedure:  Tolerated well, no immediate complications   (including critical care time)  Medications Ordered in ED Medications - No data to display   Initial Impression / Assessment and Plan / ED Course  I have reviewed the triage vital signs and the nursing notes.  Pertinent labs & imaging results that were available during my care of the patient were reviewed by me and considered in my medical decision making (see chart for details).  Clinical Course    In the triage area his bladder was drained however catheter was not left.  I think the patient needs a catheter placed and outpatient urology follow-up otherwise will be in a similar predicament in the next several hours.  He'll continue to follow with his urology team.  Foley catheter and leg bag now.  Final Clinical Impressions(s) / ED Diagnoses   Final diagnoses:  None    New Prescriptions New Prescriptions   No medications on file     Jola Schmidt, MD 02/13/16 [place], MD 02/13/16 2351

## 2016-02-14 ENCOUNTER — Emergency Department (HOSPITAL_COMMUNITY)
Admission: EM | Admit: 2016-02-14 | Discharge: 2016-02-14 | Disposition: A | Discharge status: Home / Self Care | Payer: Medicare Other | Source: Home / Self Care | Attending: Emergency Medicine | Admitting: Emergency Medicine

## 2016-02-14 ENCOUNTER — Encounter (HOSPITAL_COMMUNITY): Payer: Self-pay | Admitting: *Deleted

## 2016-02-14 DIAGNOSIS — T83091A Other mechanical complication of indwelling urethral catheter, initial encounter: Secondary | ICD-10-CM

## 2016-02-14 DIAGNOSIS — Z7982 Long term (current) use of aspirin: Secondary | ICD-10-CM

## 2016-02-14 DIAGNOSIS — Z8546 Personal history of malignant neoplasm of prostate: Secondary | ICD-10-CM | POA: Insufficient documentation

## 2016-02-14 DIAGNOSIS — Y733 Surgical instruments, materials and gastroenterology and urology devices (including sutures) associated with adverse incidents: Secondary | ICD-10-CM

## 2016-02-14 DIAGNOSIS — Z7984 Long term (current) use of oral hypoglycemic drugs: Secondary | ICD-10-CM

## 2016-02-14 DIAGNOSIS — Z87891 Personal history of nicotine dependence: Secondary | ICD-10-CM | POA: Insufficient documentation

## 2016-02-14 DIAGNOSIS — R319 Hematuria, unspecified: Secondary | ICD-10-CM | POA: Insufficient documentation

## 2016-02-14 DIAGNOSIS — I10 Essential (primary) hypertension: Secondary | ICD-10-CM

## 2016-02-14 DIAGNOSIS — R339 Retention of urine, unspecified: Secondary | ICD-10-CM

## 2016-02-14 DIAGNOSIS — T83098A Other mechanical complication of other indwelling urethral catheter, initial encounter: Secondary | ICD-10-CM

## 2016-02-14 DIAGNOSIS — Z79899 Other long term (current) drug therapy: Secondary | ICD-10-CM | POA: Insufficient documentation

## 2016-02-14 DIAGNOSIS — E119 Type 2 diabetes mellitus without complications: Secondary | ICD-10-CM | POA: Insufficient documentation

## 2016-02-14 NOTE — ED Notes (Signed)
Pt departed in NAD.  

## 2016-02-14 NOTE — ED Notes (Signed)
Foley catheter irrigated, 600 mL returned. [place] notified. Pt noticed immediate relief in pain.

## 2016-02-14 NOTE — ED Notes (Signed)
Bed: WA24 Expected date:  Expected time:  Means of arrival:  Comments: TR2 

## 2016-02-14 NOTE — ED Notes (Signed)
Bladder scan amount: 601 mL

## 2016-02-14 NOTE — ED Notes (Signed)
Pt bladder scan revealed pt had >134ml of urine in bladder.

## 2016-02-14 NOTE — ED Notes (Signed)
Foley catheter irrigated. Pt tolerated well.

## 2016-02-14 NOTE — ED Triage Notes (Signed)
Pt complains of urinary retention since ~1PM today. Pt states he has 10/10 painful bladder spasms. Pt had catheter placed yesterday due to urinary retention, pt believes catheter is clogged with blood clots.

## 2016-02-14 NOTE — ED Provider Notes (Signed)
[facility] DEPT Provider Note   CSN: [id number] Arrival date & time: 02/14/16  1623     History   Chief Complaint Chief Complaint  Patient presents with  . Urinary Retention    HPI Larry Welch is a 70 y.o. male.  The history is provided by the patient.  Patient presents with urinary retention. Yesterday morning at urology and Dr. Junious Silk had a cystoscopy. He had it done for bladder spasms and follow-up on hematuria. Reportedly it was normal. Yesterday he then developed some hematuria and urinary retention. Foley catheter was placed with some difficulty in the ER. He had been doing well with some continued bladder spasms. Followed up with the nurse practitioner in the office today. He later took a nap at around 1:00 had a nap and since then has not urinated. States he feels as if he has to go. There's been blood in the urine. No fevers.  Past Medical History:  Diagnosis Date  . Cancer [facility])    prostate  . Diabetes mellitus   . High cholesterol   . Hypertension     Patient Active Problem List   Diagnosis Date Noted  . OSA (obstructive sleep apnea) 09/11/2012    Past Surgical History:  Procedure Laterality Date  . BACK SURGERY  2009  . FOOT SURGERY  19080s   Ganglion cyst inner right foot.  . INSERTION PROSTATE RADIATION SEED         Home Medications    Prior to Admission medications   Medication Sig Start Date End Date Taking? Authorizing Provider  aliskiren (TEKTURNA) 300 MG tablet Take 300 mg by mouth daily.   Yes Historical Provider, MD  amLODipine-olmesartan (AZOR) 10-40 MG per tablet Take 1 tablet by mouth at bedtime.   Yes Historical Provider, MD  aspirin 325 MG tablet Take 325 mg by mouth at bedtime.   Yes Historical Provider, MD  Choline Fenofibrate (FENOFIBRIC ACID) 135 MG CPDR Take 135 mg by mouth at bedtime.    Yes Historical Provider, MD  metFORMIN (GLUCOPHAGE) 500 MG tablet Take 500 mg by mouth at bedtime.   Yes Historical Provider, MD    rosuvastatin (CRESTOR) 10 MG tablet Take 10 mg by mouth at bedtime.   Yes Historical Provider, MD  silodosin (RAPAFLO) 8 MG CAPS capsule Take 8 mg by mouth at bedtime.   Yes Historical Provider, MD  sitaGLIPtin (JANUVIA) 100 MG tablet Take 100 mg by mouth at bedtime.   Yes Historical Provider, MD    Family History Family History  Problem Relation Age of Onset  . Cancer Father     Social History Social History  Substance Use Topics  . Smoking status: Former Smoker    Packs/day: 0.75    Years: 12.00    Types: Cigarettes    Quit date: 06/10/1989  . Smokeless tobacco: Never Used  . Alcohol use Yes     Allergies   Review of patient's allergies indicates no known allergies.   Review of Systems Review of Systems  Constitutional: Negative for appetite change and fever.  Respiratory: Negative for shortness of breath.   Gastrointestinal: Positive for abdominal pain.  Genitourinary: Positive for decreased urine volume, difficulty urinating and hematuria. Negative for frequency.  Musculoskeletal: Negative for joint swelling.  Neurological: Negative for weakness and numbness.     Physical Exam Updated Vital Signs Pulse 110   Temp 98 F (36.7 C)   Resp 18   SpO2 99%   Physical Exam  Constitutional: He appears well-developed.  HENT:  Head: Atraumatic.  Cardiovascular: Normal rate.   Pulmonary/Chest: Effort normal.  Abdominal: He exhibits mass.  Suprapubic mass and lower abdominal tenderness.  Genitourinary:  Genitourinary Comments: Foley catheter in place with minimal urine bag with some hematuria in leg bag  Skin: Skin is warm.     ED Treatments / Results  Labs (all labs ordered are listed, but only abnormal results are displayed) Labs Reviewed - No data to display  EKG  EKG Interpretation None       Radiology No results found.  Procedures Procedures (including critical care time)  Medications Ordered in ED Medications - No data to  display   Initial Impression / Assessment and Plan / ED Course  I have reviewed the triage vital signs and the nursing notes.  Pertinent labs & imaging results that were available during my care of the patient were reviewed by me and considered in my medical decision making (see chart for details).  Clinical Course    Patient with urinary retention with Foley catheter in place. Has hematuria. Retention improved after irrigating Foley. Still some hematuria. Has urologist. Will discharge  Follow-up with Dr. Junious Silk as needed. Final Clinical Impressions(s) / ED Diagnoses   Final diagnoses:  Obstructed Foley catheter, initial encounter [facility])  Urinary retention  Hematuria    New Prescriptions New Prescriptions   No medications on file     Davonna Belling, MD 02/14/16 1830

## 2019-07-02 DIAGNOSIS — E782 Mixed hyperlipidemia: Secondary | ICD-10-CM | POA: Diagnosis not present

## 2019-07-02 DIAGNOSIS — I10 Essential (primary) hypertension: Secondary | ICD-10-CM | POA: Diagnosis not present

## 2019-07-02 DIAGNOSIS — M130 Polyarthritis, unspecified: Secondary | ICD-10-CM | POA: Diagnosis not present

## 2019-07-02 DIAGNOSIS — E1169 Type 2 diabetes mellitus with other specified complication: Secondary | ICD-10-CM | POA: Diagnosis not present

## 2019-07-02 DIAGNOSIS — E559 Vitamin D deficiency, unspecified: Secondary | ICD-10-CM | POA: Diagnosis not present

## 2019-07-04 ENCOUNTER — Ambulatory Visit: Payer: Medicare Other | Attending: Internal Medicine

## 2019-07-04 DIAGNOSIS — Z23 Encounter for immunization: Secondary | ICD-10-CM | POA: Insufficient documentation

## 2019-07-05 NOTE — Progress Notes (Signed)
   Covid-19 Vaccination Clinic  Name:  DERRIC DANDURAND    MRN: [id number] DOB: 1945/07/27  07/04/2019  Mr. Croan was observed post Covid-19 immunization for 15 minutes without incidence. He was provided with Vaccine Information Sheet and instruction to access the V-Safe system.   Mr. Bolosan was instructed to call 911 with any severe reactions post vaccine: Marland Kitchen Difficulty breathing  . Swelling of your face and throat  . A fast heartbeat  . A bad rash all over your body  . Dizziness and weakness    Immunizations Administered    Name Date Dose VIS Date Route   Moderna COVID-19 Vaccine 07/04/2019  4:04 PM 0.5 mL 05/11/2019 Intramuscular   Manufacturer: Levan Hurst   Lot: [id number]   [place]PO:[id number]      Documented on behalf of: K. Quentin Cornwall

## 2019-07-13 DIAGNOSIS — Z0000 Encounter for general adult medical examination without abnormal findings: Secondary | ICD-10-CM | POA: Diagnosis not present

## 2019-08-01 ENCOUNTER — Ambulatory Visit: Payer: Medicare Other | Attending: Internal Medicine

## 2019-08-01 DIAGNOSIS — Z23 Encounter for immunization: Secondary | ICD-10-CM | POA: Insufficient documentation

## 2019-08-01 NOTE — Progress Notes (Signed)
   Covid-19 Vaccination Clinic  Name:  Larry Welch    MRN: [id number] DOB: 1945-08-02  08/01/2019  [name] was observed post Covid-19 immunization for 15 minutes without incidence. He was provided with Vaccine Information Sheet and instruction to access the V-Safe system.   [name] was instructed to call 911 with any severe reactions post vaccine: Marland Kitchen Difficulty breathing  . Swelling of your face and throat  . A fast heartbeat  . A bad rash all over your body  . Dizziness and weakness    Immunizations Administered    Name Date Dose VIS Date Route   Moderna COVID-19 Vaccine 08/01/2019  2:22 PM 0.5 mL 05/11/2019 Intramuscular   Manufacturer: Moderna   Lot: [id number]   [place]lePO:[id number]

## 2019-10-26 DIAGNOSIS — Z8546 Personal history of malignant neoplasm of prostate: Secondary | ICD-10-CM | POA: Diagnosis not present

## 2019-10-26 DIAGNOSIS — N35012 Post-traumatic membranous urethral stricture: Secondary | ICD-10-CM | POA: Diagnosis not present

## 2019-11-04 DIAGNOSIS — H2511 Age-related nuclear cataract, right eye: Secondary | ICD-10-CM | POA: Diagnosis not present

## 2019-11-04 DIAGNOSIS — E782 Mixed hyperlipidemia: Secondary | ICD-10-CM | POA: Diagnosis not present

## 2019-11-04 DIAGNOSIS — H04123 Dry eye syndrome of bilateral lacrimal glands: Secondary | ICD-10-CM | POA: Diagnosis not present

## 2019-11-04 DIAGNOSIS — H10413 Chronic giant papillary conjunctivitis, bilateral: Secondary | ICD-10-CM | POA: Diagnosis not present

## 2019-11-04 DIAGNOSIS — E1169 Type 2 diabetes mellitus with other specified complication: Secondary | ICD-10-CM | POA: Diagnosis not present

## 2019-11-04 DIAGNOSIS — H40013 Open angle with borderline findings, low risk, bilateral: Secondary | ICD-10-CM | POA: Diagnosis not present

## 2019-11-04 DIAGNOSIS — Z961 Presence of intraocular lens: Secondary | ICD-10-CM | POA: Diagnosis not present

## 2019-11-04 DIAGNOSIS — E119 Type 2 diabetes mellitus without complications: Secondary | ICD-10-CM | POA: Diagnosis not present

## 2019-11-04 DIAGNOSIS — I10 Essential (primary) hypertension: Secondary | ICD-10-CM | POA: Diagnosis not present

## 2019-11-04 DIAGNOSIS — H05243 Constant exophthalmos, bilateral: Secondary | ICD-10-CM | POA: Diagnosis not present

## 2019-12-31 DIAGNOSIS — L0292 Furuncle, unspecified: Secondary | ICD-10-CM | POA: Diagnosis not present

## 2019-12-31 DIAGNOSIS — E1169 Type 2 diabetes mellitus with other specified complication: Secondary | ICD-10-CM | POA: Diagnosis not present

## 2019-12-31 DIAGNOSIS — Z6830 Body mass index (BMI) 30.0-30.9, adult: Secondary | ICD-10-CM | POA: Diagnosis not present

## 2020-01-04 DIAGNOSIS — Z0000 Encounter for general adult medical examination without abnormal findings: Secondary | ICD-10-CM | POA: Diagnosis not present

## 2020-01-04 DIAGNOSIS — L02215 Cutaneous abscess of perineum: Secondary | ICD-10-CM | POA: Diagnosis not present

## 2020-01-10 DIAGNOSIS — L02215 Cutaneous abscess of perineum: Secondary | ICD-10-CM | POA: Diagnosis not present

## 2020-01-28 DIAGNOSIS — R110 Nausea: Secondary | ICD-10-CM | POA: Diagnosis not present

## 2020-01-28 DIAGNOSIS — R112 Nausea with vomiting, unspecified: Secondary | ICD-10-CM | POA: Diagnosis not present

## 2020-01-28 DIAGNOSIS — I10 Essential (primary) hypertension: Secondary | ICD-10-CM | POA: Diagnosis not present

## 2020-01-28 DIAGNOSIS — T675XXA Heat exhaustion, unspecified, initial encounter: Secondary | ICD-10-CM | POA: Diagnosis not present

## 2020-01-28 DIAGNOSIS — T679XXA Effect of heat and light, unspecified, initial encounter: Secondary | ICD-10-CM | POA: Diagnosis not present

## 2020-01-28 DIAGNOSIS — R55 Syncope and collapse: Secondary | ICD-10-CM | POA: Diagnosis not present

## 2020-01-28 DIAGNOSIS — Z79899 Other long term (current) drug therapy: Secondary | ICD-10-CM | POA: Diagnosis not present

## 2020-01-28 DIAGNOSIS — R2990 Unspecified symptoms and signs involving the nervous system: Secondary | ICD-10-CM | POA: Diagnosis not present

## 2020-01-28 DIAGNOSIS — R269 Unspecified abnormalities of gait and mobility: Secondary | ICD-10-CM | POA: Diagnosis not present

## 2020-01-28 DIAGNOSIS — R270 Ataxia, unspecified: Secondary | ICD-10-CM | POA: Diagnosis not present

## 2020-01-28 DIAGNOSIS — E119 Type 2 diabetes mellitus without complications: Secondary | ICD-10-CM | POA: Diagnosis not present

## 2020-01-28 DIAGNOSIS — Z7984 Long term (current) use of oral hypoglycemic drugs: Secondary | ICD-10-CM | POA: Diagnosis not present

## 2020-01-28 DIAGNOSIS — E782 Mixed hyperlipidemia: Secondary | ICD-10-CM | POA: Diagnosis not present

## 2020-01-28 DIAGNOSIS — R260 Ataxic gait: Secondary | ICD-10-CM | POA: Diagnosis not present

## 2020-01-28 DIAGNOSIS — I739 Peripheral vascular disease, unspecified: Secondary | ICD-10-CM | POA: Diagnosis not present

## 2020-01-28 DIAGNOSIS — R2689 Other abnormalities of gait and mobility: Secondary | ICD-10-CM | POA: Diagnosis not present

## 2020-01-28 DIAGNOSIS — I083 Combined rheumatic disorders of mitral, aortic and tricuspid valves: Secondary | ICD-10-CM | POA: Diagnosis not present

## 2020-01-29 DIAGNOSIS — R2990 Unspecified symptoms and signs involving the nervous system: Secondary | ICD-10-CM | POA: Diagnosis not present

## 2020-01-29 DIAGNOSIS — R269 Unspecified abnormalities of gait and mobility: Secondary | ICD-10-CM | POA: Diagnosis not present

## 2020-01-29 DIAGNOSIS — I6523 Occlusion and stenosis of bilateral carotid arteries: Secondary | ICD-10-CM | POA: Diagnosis not present

## 2020-01-29 DIAGNOSIS — R2689 Other abnormalities of gait and mobility: Secondary | ICD-10-CM | POA: Diagnosis not present

## 2020-01-29 DIAGNOSIS — R55 Syncope and collapse: Secondary | ICD-10-CM | POA: Diagnosis not present

## 2020-01-29 DIAGNOSIS — R270 Ataxia, unspecified: Secondary | ICD-10-CM | POA: Diagnosis not present

## 2020-01-30 DIAGNOSIS — R9431 Abnormal electrocardiogram [ECG] [EKG]: Secondary | ICD-10-CM | POA: Diagnosis not present

## 2020-02-25 DIAGNOSIS — H35033 Hypertensive retinopathy, bilateral: Secondary | ICD-10-CM | POA: Diagnosis not present

## 2020-02-25 DIAGNOSIS — Z79899 Other long term (current) drug therapy: Secondary | ICD-10-CM | POA: Diagnosis not present

## 2020-02-25 DIAGNOSIS — Z5181 Encounter for therapeutic drug level monitoring: Secondary | ICD-10-CM | POA: Diagnosis not present

## 2020-02-25 DIAGNOSIS — H2511 Age-related nuclear cataract, right eye: Secondary | ICD-10-CM | POA: Diagnosis not present

## 2020-02-25 DIAGNOSIS — H353131 Nonexudative age-related macular degeneration, bilateral, early dry stage: Secondary | ICD-10-CM | POA: Diagnosis not present

## 2020-02-25 DIAGNOSIS — H209 Unspecified iridocyclitis: Secondary | ICD-10-CM | POA: Diagnosis not present

## 2020-02-25 DIAGNOSIS — Z961 Presence of intraocular lens: Secondary | ICD-10-CM | POA: Diagnosis not present

## 2020-02-25 DIAGNOSIS — H33312 Horseshoe tear of retina without detachment, left eye: Secondary | ICD-10-CM | POA: Diagnosis not present

## 2020-03-06 DIAGNOSIS — Z23 Encounter for immunization: Secondary | ICD-10-CM | POA: Diagnosis not present

## 2020-03-06 DIAGNOSIS — I10 Essential (primary) hypertension: Secondary | ICD-10-CM | POA: Diagnosis not present

## 2020-03-06 DIAGNOSIS — E1100 Type 2 diabetes mellitus with hyperosmolarity without nonketotic hyperglycemic-hyperosmolar coma (NKHHC): Secondary | ICD-10-CM | POA: Diagnosis not present

## 2020-03-06 DIAGNOSIS — E782 Mixed hyperlipidemia: Secondary | ICD-10-CM | POA: Diagnosis not present

## 2020-03-06 DIAGNOSIS — E860 Dehydration: Secondary | ICD-10-CM | POA: Diagnosis not present

## 2020-05-08 DIAGNOSIS — D649 Anemia, unspecified: Secondary | ICD-10-CM | POA: Diagnosis not present

## 2020-05-08 DIAGNOSIS — E782 Mixed hyperlipidemia: Secondary | ICD-10-CM | POA: Diagnosis not present

## 2020-05-08 DIAGNOSIS — I10 Essential (primary) hypertension: Secondary | ICD-10-CM | POA: Diagnosis not present

## 2020-05-08 DIAGNOSIS — E1169 Type 2 diabetes mellitus with other specified complication: Secondary | ICD-10-CM | POA: Diagnosis not present

## 2020-06-08 DIAGNOSIS — M1711 Unilateral primary osteoarthritis, right knee: Secondary | ICD-10-CM | POA: Diagnosis not present

## 2020-06-12 DIAGNOSIS — I10 Essential (primary) hypertension: Secondary | ICD-10-CM | POA: Diagnosis not present

## 2020-06-12 DIAGNOSIS — E1169 Type 2 diabetes mellitus with other specified complication: Secondary | ICD-10-CM | POA: Diagnosis not present

## 2020-06-12 DIAGNOSIS — E782 Mixed hyperlipidemia: Secondary | ICD-10-CM | POA: Diagnosis not present

## 2020-06-16 DIAGNOSIS — Z20822 Contact with and (suspected) exposure to covid-19: Secondary | ICD-10-CM | POA: Diagnosis not present

## 2020-06-20 DIAGNOSIS — Z20822 Contact with and (suspected) exposure to covid-19: Secondary | ICD-10-CM | POA: Diagnosis not present

## 2020-07-13 DIAGNOSIS — E782 Mixed hyperlipidemia: Secondary | ICD-10-CM | POA: Diagnosis not present

## 2020-07-13 DIAGNOSIS — E1169 Type 2 diabetes mellitus with other specified complication: Secondary | ICD-10-CM | POA: Diagnosis not present

## 2020-07-13 DIAGNOSIS — I10 Essential (primary) hypertension: Secondary | ICD-10-CM | POA: Diagnosis not present

## 2020-07-13 DIAGNOSIS — M130 Polyarthritis, unspecified: Secondary | ICD-10-CM | POA: Diagnosis not present

## 2020-10-30 DIAGNOSIS — R350 Frequency of micturition: Secondary | ICD-10-CM | POA: Diagnosis not present

## 2020-10-30 DIAGNOSIS — N35012 Post-traumatic membranous urethral stricture: Secondary | ICD-10-CM | POA: Diagnosis not present

## 2020-10-30 DIAGNOSIS — Z8546 Personal history of malignant neoplasm of prostate: Secondary | ICD-10-CM | POA: Diagnosis not present

## 2020-11-08 DIAGNOSIS — Z961 Presence of intraocular lens: Secondary | ICD-10-CM | POA: Diagnosis not present

## 2020-11-08 DIAGNOSIS — H10413 Chronic giant papillary conjunctivitis, bilateral: Secondary | ICD-10-CM | POA: Diagnosis not present

## 2020-11-08 DIAGNOSIS — H40013 Open angle with borderline findings, low risk, bilateral: Secondary | ICD-10-CM | POA: Diagnosis not present

## 2020-11-08 DIAGNOSIS — H04123 Dry eye syndrome of bilateral lacrimal glands: Secondary | ICD-10-CM | POA: Diagnosis not present

## 2020-11-08 DIAGNOSIS — H05243 Constant exophthalmos, bilateral: Secondary | ICD-10-CM | POA: Diagnosis not present

## 2020-11-08 DIAGNOSIS — E119 Type 2 diabetes mellitus without complications: Secondary | ICD-10-CM | POA: Diagnosis not present

## 2020-11-08 DIAGNOSIS — H2511 Age-related nuclear cataract, right eye: Secondary | ICD-10-CM | POA: Diagnosis not present

## 2020-11-10 DIAGNOSIS — M130 Polyarthritis, unspecified: Secondary | ICD-10-CM | POA: Diagnosis not present

## 2020-11-10 DIAGNOSIS — E1169 Type 2 diabetes mellitus with other specified complication: Secondary | ICD-10-CM | POA: Diagnosis not present

## 2020-11-10 DIAGNOSIS — R001 Bradycardia, unspecified: Secondary | ICD-10-CM | POA: Diagnosis not present

## 2020-11-10 DIAGNOSIS — E559 Vitamin D deficiency, unspecified: Secondary | ICD-10-CM | POA: Diagnosis not present

## 2020-11-10 DIAGNOSIS — I10 Essential (primary) hypertension: Secondary | ICD-10-CM | POA: Diagnosis not present

## 2020-11-22 ENCOUNTER — Inpatient Hospital Stay: Payer: Medicare PPO

## 2020-11-22 ENCOUNTER — Other Ambulatory Visit: Payer: Self-pay

## 2020-11-22 ENCOUNTER — Other Ambulatory Visit: Payer: Self-pay | Admitting: Family Medicine

## 2020-11-22 DIAGNOSIS — R001 Bradycardia, unspecified: Secondary | ICD-10-CM | POA: Diagnosis not present

## 2020-12-14 DIAGNOSIS — R001 Bradycardia, unspecified: Secondary | ICD-10-CM | POA: Diagnosis not present

## 2020-12-21 DIAGNOSIS — M130 Polyarthritis, unspecified: Secondary | ICD-10-CM | POA: Diagnosis not present

## 2020-12-21 DIAGNOSIS — I10 Essential (primary) hypertension: Secondary | ICD-10-CM | POA: Diagnosis not present

## 2020-12-21 DIAGNOSIS — E1169 Type 2 diabetes mellitus with other specified complication: Secondary | ICD-10-CM | POA: Diagnosis not present

## 2020-12-21 DIAGNOSIS — E559 Vitamin D deficiency, unspecified: Secondary | ICD-10-CM | POA: Diagnosis not present

## 2020-12-27 DIAGNOSIS — R001 Bradycardia, unspecified: Secondary | ICD-10-CM | POA: Diagnosis not present

## 2021-01-16 DIAGNOSIS — H2511 Age-related nuclear cataract, right eye: Secondary | ICD-10-CM | POA: Diagnosis not present

## 2021-01-16 DIAGNOSIS — H35033 Hypertensive retinopathy, bilateral: Secondary | ICD-10-CM | POA: Diagnosis not present

## 2021-01-16 DIAGNOSIS — Z961 Presence of intraocular lens: Secondary | ICD-10-CM | POA: Diagnosis not present

## 2021-01-16 DIAGNOSIS — H209 Unspecified iridocyclitis: Secondary | ICD-10-CM | POA: Diagnosis not present

## 2021-01-16 DIAGNOSIS — Z79899 Other long term (current) drug therapy: Secondary | ICD-10-CM | POA: Diagnosis not present

## 2021-01-16 DIAGNOSIS — H353131 Nonexudative age-related macular degeneration, bilateral, early dry stage: Secondary | ICD-10-CM | POA: Diagnosis not present

## 2021-01-16 DIAGNOSIS — H33312 Horseshoe tear of retina without detachment, left eye: Secondary | ICD-10-CM | POA: Diagnosis not present

## 2021-04-04 ENCOUNTER — Emergency Department (HOSPITAL_COMMUNITY): Payer: Medicare PPO

## 2021-04-04 ENCOUNTER — Emergency Department (HOSPITAL_COMMUNITY)
Admission: EM | Admit: 2021-04-04 | Discharge: 2021-04-05 | Disposition: A | Discharge status: Home / Self Care | Payer: Medicare PPO | Attending: Emergency Medicine | Admitting: Emergency Medicine

## 2021-04-04 ENCOUNTER — Encounter: Payer: Self-pay | Admitting: Emergency Medicine

## 2021-04-04 ENCOUNTER — Other Ambulatory Visit: Payer: Self-pay

## 2021-04-04 ENCOUNTER — Ambulatory Visit
Admission: EM | Admit: 2021-04-04 | Discharge: 2021-04-04 | Disposition: A | Discharge status: Other Acute Inpatient Hospital | Payer: Medicare PPO

## 2021-04-04 DIAGNOSIS — Z7984 Long term (current) use of oral hypoglycemic drugs: Secondary | ICD-10-CM | POA: Diagnosis not present

## 2021-04-04 DIAGNOSIS — L089 Local infection of the skin and subcutaneous tissue, unspecified: Secondary | ICD-10-CM | POA: Diagnosis not present

## 2021-04-04 DIAGNOSIS — G4733 Obstructive sleep apnea (adult) (pediatric): Secondary | ICD-10-CM | POA: Insufficient documentation

## 2021-04-04 DIAGNOSIS — H209 Unspecified iridocyclitis: Secondary | ICD-10-CM | POA: Diagnosis not present

## 2021-04-04 DIAGNOSIS — S81802A Unspecified open wound, left lower leg, initial encounter: Secondary | ICD-10-CM | POA: Insufficient documentation

## 2021-04-04 DIAGNOSIS — W293XXA Contact with powered garden and outdoor hand tools and machinery, initial encounter: Secondary | ICD-10-CM | POA: Insufficient documentation

## 2021-04-04 DIAGNOSIS — I10 Essential (primary) hypertension: Secondary | ICD-10-CM | POA: Diagnosis not present

## 2021-04-04 DIAGNOSIS — E663 Overweight: Secondary | ICD-10-CM | POA: Diagnosis not present

## 2021-04-04 DIAGNOSIS — G8929 Other chronic pain: Secondary | ICD-10-CM | POA: Diagnosis not present

## 2021-04-04 DIAGNOSIS — Z8546 Personal history of malignant neoplasm of prostate: Secondary | ICD-10-CM | POA: Diagnosis not present

## 2021-04-04 DIAGNOSIS — R600 Localized edema: Secondary | ICD-10-CM | POA: Diagnosis not present

## 2021-04-04 DIAGNOSIS — Z79899 Other long term (current) drug therapy: Secondary | ICD-10-CM | POA: Insufficient documentation

## 2021-04-04 DIAGNOSIS — E785 Hyperlipidemia, unspecified: Secondary | ICD-10-CM | POA: Diagnosis not present

## 2021-04-04 DIAGNOSIS — Z87891 Personal history of nicotine dependence: Secondary | ICD-10-CM | POA: Insufficient documentation

## 2021-04-04 DIAGNOSIS — D649 Anemia, unspecified: Secondary | ICD-10-CM | POA: Diagnosis not present

## 2021-04-04 DIAGNOSIS — L03116 Cellulitis of left lower limb: Secondary | ICD-10-CM | POA: Diagnosis not present

## 2021-04-04 DIAGNOSIS — E119 Type 2 diabetes mellitus without complications: Secondary | ICD-10-CM | POA: Diagnosis not present

## 2021-04-04 DIAGNOSIS — B029 Zoster without complications: Secondary | ICD-10-CM | POA: Diagnosis not present

## 2021-04-04 DIAGNOSIS — M1990 Unspecified osteoarthritis, unspecified site: Secondary | ICD-10-CM | POA: Diagnosis not present

## 2021-04-04 LAB — COMPREHENSIVE METABOLIC PANEL
ALT: 17 U/L (ref 0–44)
AST: 23 U/L (ref 15–41)
Albumin: 4 g/dL (ref 3.5–5.0)
Alkaline Phosphatase: 33 U/L — ABNORMAL LOW (ref 38–126)
Anion gap: 6 (ref 5–15)
BUN: 15 mg/dL (ref 8–23)
CO2: 26 mmol/L (ref 22–32)
Calcium: 9.5 mg/dL (ref 8.9–10.3)
Chloride: 105 mmol/L (ref 98–111)
Creatinine, Ser: 1.24 mg/dL (ref 0.61–1.24)
GFR, Estimated: >60 mL/min (ref >60)
Glucose, Bld: 85 mg/dL (ref 70–99)
Potassium: 4.2 mmol/L (ref 3.5–5.1)
Sodium: 137 mmol/L (ref 135–145)
Total Bilirubin: 0.4 mg/dL (ref 0.3–1.2)
Total Protein: 7.3 g/dL (ref 6.5–8.1)

## 2021-04-04 LAB — CBC WITH DIFFERENTIAL/PLATELET
Abs Immature Granulocytes: 0.02 10*3/uL (ref 0.00–0.07)
Basophils Absolute: 0 10*3/uL (ref 0.0–0.1)
Basophils Relative: 0 %
Eosinophils Absolute: 0.1 10*3/uL (ref 0.0–0.5)
Eosinophils Relative: 1 %
HCT: 37.6 % — ABNORMAL LOW (ref 39.0–52.0)
Hemoglobin: 12.1 g/dL — ABNORMAL LOW (ref 13.0–17.0)
Immature Granulocytes: 0 %
Lymphocytes Relative: 24 %
Lymphs Abs: 1.9 10*3/uL (ref 0.7–4.0)
MCH: 30.8 pg (ref 26.0–34.0)
MCHC: 32.2 g/dL (ref 30.0–36.0)
MCV: 95.7 fL (ref 80.0–100.0)
Monocytes Absolute: 0.8 10*3/uL (ref 0.1–1.0)
Monocytes Relative: 10 %
Neutro Abs: 5 10*3/uL (ref 1.7–7.7)
Neutrophils Relative %: 65 %
Platelets: 270 10*3/uL (ref 150–400)
RBC: 3.93 MIL/uL — ABNORMAL LOW (ref 4.22–5.81)
RDW: 16.8 % — ABNORMAL HIGH (ref 11.5–15.5)
WBC: 7.8 10*3/uL (ref 4.0–10.5)
nRBC: 0 % (ref 0.0–0.2)

## 2021-04-04 LAB — URINALYSIS, ROUTINE W REFLEX MICROSCOPIC
Bilirubin Urine: NEGATIVE
Glucose, UA: NEGATIVE mg/dL
Hgb urine dipstick: NEGATIVE
Ketones, ur: NEGATIVE mg/dL
Leukocytes,Ua: NEGATIVE
Nitrite: NEGATIVE
Protein, ur: NEGATIVE mg/dL
Specific Gravity, Urine: 1.017 (ref 1.005–1.030)
pH: 5 (ref 5.0–8.0)

## 2021-04-04 LAB — LACTIC ACID, PLASMA: Lactic Acid, Venous: 0.6 mmol/L (ref 0.5–1.9)

## 2021-04-04 NOTE — ED Triage Notes (Signed)
Pt has wound to left lower leg from pressure washer. Wound non healing for 1 month. Pt denies fevers/chills.

## 2021-04-04 NOTE — ED Provider Notes (Signed)
Larry Welch    CSN: [id number] Arrival date & time: 04/04/21  1605      History   Chief Complaint Chief Complaint  Patient presents with   Wound Check    HPI Larry Welch is a 75 y.o. male.  Patient presents with left lower leg wound from a pressure washer injury that occurred 1 month ago. He has attempted care at home with cleansing and applying a dressing.  The wound is getting worse.  It is painful, red, swollen, draining pus, and malodorous.  He denies fever, chills, or other symptoms.  Patient's medical history includes diabetes and hypertension.  The history is provided by the patient and medical records.   Past Medical History:  Diagnosis Date   Cancer [facility])    prostate   Diabetes mellitus    High cholesterol    Hypertension     Patient Active Problem List   Diagnosis Date Noted   OSA (obstructive sleep apnea) 09/11/2012    Past Surgical History:  Procedure Laterality Date   BACK SURGERY  2009   FOOT SURGERY  19080s   Ganglion cyst inner right foot.   INSERTION PROSTATE RADIATION SEED         Home Medications    Prior to Admission medications   Medication Sig Start Date End Date Taking? Authorizing Provider  aliskiren (TEKTURNA) 300 MG tablet Take 300 mg by mouth daily.    [provider]  amLODipine-olmesartan (AZOR) 10-40 MG per tablet Take 1 tablet by mouth at bedtime.    [provider]  aspirin 325 MG tablet Take 325 mg by mouth at bedtime.    [provider]  Choline Fenofibrate (FENOFIBRIC ACID) 135 MG CPDR Take 135 mg by mouth at bedtime.     [provider]  metFORMIN (GLUCOPHAGE) 500 MG tablet Take 500 mg by mouth at bedtime.    [provider]  rosuvastatin (CRESTOR) 10 MG tablet Take 10 mg by mouth at bedtime.    [provider]  silodosin (RAPAFLO) 8 MG CAPS capsule Take 8 mg by mouth at bedtime.    [provider]  sitaGLIPtin (JANUVIA) 100 MG tablet Take 100 mg by  mouth at bedtime.    [provider]    Family History Family History  Problem Relation Age of Onset   Cancer Father     Social History Social History   Tobacco Use   Smoking status: Former    Packs/day: 0.75    Years: 12.00    Pack years: 9.00    Types: Cigarettes    Quit date: 06/10/1989    Years since quitting: 31.8   Smokeless tobacco: Never  Substance Use Topics   Alcohol use: Yes   Drug use: No     Allergies   Patient has no known allergies.   Review of Systems Review of Systems  Constitutional:  Negative for chills and fever.  Respiratory:  Negative for cough and shortness of breath.   Cardiovascular:  Negative for chest pain and palpitations.  Skin:  Positive for color change and wound.  Neurological:  Negative for weakness and numbness.  All other systems reviewed and are negative.   Physical Exam Triage Vital Signs ED Triage Vitals  Enc Vitals Group     BP 04/04/21 1622 127/81     Pulse Rate 04/04/21 1622 68     Resp 04/04/21 1622 18     Temp 04/04/21 1622 97.7 F (36.5 C)  Temp Source 04/04/21 1622 Oral     SpO2 04/04/21 1622 97 %     Weight --      Height --      Head Circumference --      Peak Flow --      Pain Score 04/04/21 1632 5     Pain Loc --      Pain Edu? --      Excl. in [place]? --    No data found.  Updated Vital Signs BP 127/81 (BP Location: Left Arm)   Pulse 68   Temp 97.7 F (36.5 C) (Oral)   Resp 18   SpO2 97%   Visual Acuity Right Eye Distance:   Left Eye Distance:   Bilateral Distance:    Right Eye Near:   Left Eye Near:    Bilateral Near:     Physical Exam Vitals and nursing note reviewed.  Constitutional:      General: He is not in acute distress.    Appearance: He is well-developed.  HENT:     Head: Normocephalic and atraumatic.     Mouth/Throat:     Mouth: Mucous membranes are moist.  Eyes:     Conjunctiva/sclera: Conjunctivae normal.  Cardiovascular:     Rate and Rhythm: Normal rate and  regular rhythm.     Heart sounds: Normal heart sounds.  Pulmonary:     Effort: Pulmonary effort is normal. No respiratory distress.     Breath sounds: Normal breath sounds.  Abdominal:     Palpations: Abdomen is soft.     Tenderness: There is no abdominal tenderness.  Musculoskeletal:        General: Swelling and tenderness present. Normal range of motion.     Cervical back: Neck supple.  Skin:    General: Skin is warm and dry.     Findings: Erythema and lesion present.     Comments: Left lower leg: Large malodorous open wound.  Lower leg is discolored, erythematous, and edematous.  See pictures for details.  Neurological:     General: No focal deficit present.     Mental Status: He is alert and oriented to person, place, and time.     Sensory: No sensory deficit.     Motor: No weakness.     Gait: Gait normal.  Psychiatric:        Mood and Affect: Mood normal.        Behavior: Behavior normal.        UC Treatments / Results  Labs (all labs ordered are listed, but only abnormal results are displayed) Labs Reviewed - No data to display  EKG   Radiology No results found.  Procedures Procedures (including critical care time)  Medications Ordered in UC Medications - No data to display  Initial Impression / Assessment and Plan / UC Course  I have reviewed the triage vital signs and the nursing notes.  Pertinent labs & imaging results that were available during my care of the patient were reviewed by me and considered in my medical decision making (see chart for details).  Infection of left lower leg due to wound caused by pressure washer injury 1 month ago.  Patient's leg is edematous with infected wound.  Strong odor of infection.  Sending him to the ED for evaluation of this pressure washer wound.  I am concerned that he needs a higher level of care, including stat lab work, imaging, possible surgical or wound care consult.  Patient agrees to  go to the ED.  His wife  will go with him.   Final Clinical Impressions(s) / UC Diagnoses   Final diagnoses:  Infection of anterior lower leg     Discharge Instructions      Go to the emergency department for evaluation of the infection of your left lower leg due to a pressure washer injury.        ED Prescriptions   None    PDMP not reviewed this encounter.   Sharion Balloon, NP 04/04/21 1659

## 2021-04-04 NOTE — ED Triage Notes (Signed)
Pt here with pressure washer injury to left shin. Did not receive medical care after the injury which happened a month ago. Covered it, kept it clean and covered, but wound has progressively gotten worse. [place]

## 2021-04-04 NOTE — Discharge Instructions (Addendum)
Go to the emergency department for evaluation of the infection of your left lower leg due to a pressure washer injury.

## 2021-04-05 MED ORDER — DOXYCYCLINE HYCLATE 100 MG PO TABS
100.0000 mg | ORAL_TABLET | Freq: Once | ORAL | Status: AC
  Administered 2021-04-05: 100 mg via ORAL
  Filled 2021-04-05: qty 1

## 2021-04-05 MED ORDER — DOXYCYCLINE HYCLATE 100 MG PO CAPS
100.0000 mg | ORAL_CAPSULE | Freq: Two times a day (BID) | ORAL | 0 refills | Status: DC
Start: 2021-04-05 — End: 2022-01-15

## 2021-04-05 NOTE — Discharge Instructions (Addendum)
If you develop fever, chills, new or worsening wound, swelling to the leg, pain to the leg, streaking redness, or any other new/concerning symptoms then return to the ER for evaluation.

## 2021-04-05 NOTE — ED Provider Notes (Signed)
[facility] EMERGENCY DEPARTMENT Provider Note   CSN: [id number] Arrival date & time: 04/04/21  1736     History Chief Complaint  Patient presents with   Wound Infection    Larry Welch is a 75 y.o. male.  HPI 75 year old male presents with nonhealing left anterior lower leg wound.  He estimates that the wound originally started about a month ago when he accidentally cut his leg with a pressure washer.  It has slowly grown since then.  He denies any fevers or chills or systemic symptoms.  There is no real pain to the area and he has noticed a little bit of purulent drainage starting a few weeks ago.  He has a little bit of chronic numbness to his toes but no new numbness or weakness.  His left lower leg is swollen but he states that is actually improved recently.  He recently had an insurance evaluation and the nurse saw his leg and said he needed to go get checked out.  He has been trying triple antibiotic ointment.  Past Medical History:  Diagnosis Date   Cancer [facility])    prostate   Diabetes mellitus    High cholesterol    Hypertension     Patient Active Problem List   Diagnosis Date Noted   OSA (obstructive sleep apnea) 09/11/2012    Past Surgical History:  Procedure Laterality Date   BACK SURGERY  2009   FOOT SURGERY  19080s   Ganglion cyst inner right foot.   INSERTION PROSTATE RADIATION SEED         Family History  Problem Relation Age of Onset   Cancer Father     Social History   Tobacco Use   Smoking status: Former    Packs/day: 0.75    Years: 12.00    Pack years: 9.00    Types: Cigarettes    Quit date: 06/10/1989    Years since quitting: 31.8   Smokeless tobacco: Never  Substance Use Topics   Alcohol use: Yes   Drug use: No    Home Medications Prior to Admission medications   Medication Sig Start Date End Date Taking? Authorizing Provider  doxycycline (VIBRAMYCIN) 100 MG capsule Take 1 capsule (100 mg total) by mouth 2  (two) times daily. One po bid x 7 days 04/05/21  Yes Sherwood Gambler, MD  aliskiren (TEKTURNA) 300 MG tablet Take 300 mg by mouth daily.    [provider]  amLODipine-olmesartan (AZOR) 10-40 MG per tablet Take 1 tablet by mouth at bedtime.    [provider]  aspirin 325 MG tablet Take 325 mg by mouth at bedtime.    [provider]  Choline Fenofibrate (FENOFIBRIC ACID) 135 MG CPDR Take 135 mg by mouth at bedtime.     [provider]  metFORMIN (GLUCOPHAGE) 500 MG tablet Take 500 mg by mouth at bedtime.    [provider]  rosuvastatin (CRESTOR) 10 MG tablet Take 10 mg by mouth at bedtime.    [provider]  silodosin (RAPAFLO) 8 MG CAPS capsule Take 8 mg by mouth at bedtime.    [provider]  sitaGLIPtin (JANUVIA) 100 MG tablet Take 100 mg by mouth at bedtime.    [provider]    Allergies    Patient has no known allergies.  Review of Systems   Review of Systems  Constitutional:  Negative for chills and fever.  Cardiovascular:  Positive for leg swelling.  Musculoskeletal:  Negative  for arthralgias.  Skin:  Positive for wound.  All other systems reviewed and are negative.  Physical Exam Updated Vital Signs BP 127/82 (BP Location: Left Arm)   Pulse (!) 48   Temp 97.8 F (36.6 C) (Oral)   Resp 16   Ht 5\' 10"  (1.778 m)   Wt 88.5 kg   SpO2 100%   BMI 27.98 kg/m   Physical Exam Vitals and nursing note reviewed.  Constitutional:      Appearance: He is well-developed.  HENT:     Head: Normocephalic and atraumatic.     Right Ear: External ear normal.     Left Ear: External ear normal.     Nose: Nose normal.  Eyes:     General:        Right eye: No discharge.        Left eye: No discharge.  Cardiovascular:     Rate and Rhythm: Normal rate and regular rhythm.     Pulses:          Dorsalis pedis pulses are 2+ on the left side.  Pulmonary:     Effort: Pulmonary effort is normal.  Abdominal:      General: There is no distension.  Musculoskeletal:     Comments: See picture from urgent care.  2 x 2 centimeter circular wound.  A little bit superior and medial to this there is a smaller wound.  Small amount of discharge on the dressing he had.  However no fluctuance, crepitus, or tenderness noted.  Grossly normal sensation to the left foot.  There is some dependent edema inferior to this.  Skin:    General: Skin is warm and dry.  Neurological:     Mental Status: He is alert.  Psychiatric:        Mood and Affect: Mood is not anxious.    ED Results / Procedures / Treatments   Labs (all labs ordered are listed, but only abnormal results are displayed) Labs Reviewed  COMPREHENSIVE METABOLIC PANEL - Abnormal; Notable for the following components:      Result Value   Alkaline Phosphatase 33 (*)    All other components within normal limits  CBC WITH DIFFERENTIAL/PLATELET - Abnormal; Notable for the following components:   RBC 3.93 (*)    Hemoglobin 12.1 (*)    HCT 37.6 (*)    RDW 16.8 (*)    All other components within normal limits  LACTIC ACID, PLASMA  URINALYSIS, ROUTINE W REFLEX MICROSCOPIC  LACTIC ACID, PLASMA    EKG None  Radiology DG Tibia/Fibula Left  Result Date: 04/04/2021 CLINICAL DATA:  Leg pain EXAM: LEFT TIBIA AND FIBULA - 2 VIEW COMPARISON:  None. FINDINGS: No fracture or malalignment. No periostitis or bone destruction. Diffuse soft tissue edema IMPRESSION: No acute osseous abnormality Electronically Signed   By: Donavan Foil M.D.   On: 04/04/2021 22:22    Procedures Procedures   Medications Ordered in ED Medications  doxycycline (VIBRA-TABS) tablet 100 mg (has no administration in time range)    ED Course  I have reviewed the triage vital signs and the nursing notes.  Pertinent labs & imaging results that were available during my care of the patient were reviewed by me and considered in my medical decision making (see chart for details).    MDM  Rules/Calculators/A&P                           Patient presents with  a nonhealing wound.  He has a strong DP pulse and he is not a known diabetic.  Unclear why he is having poor wound healing though he does describe what sounds like some chronic neuropathy symptoms.  At this point, he has no crepitus, significant tenderness, fluctuance, or other concerns to make me think he has a deeper abscess or osteomyelitis.  I do not think deep space infection is likely.  At this point, with screening labs from triage, I do not think there is a systemic illness that needs IV antibiotics.  We will try doxycycline and referred to PCP and wound care center. We discussed return precautions. Final Clinical Impression(s) / ED Diagnoses Final diagnoses:  Leg wound, left, initial encounter    Rx / DC Orders ED Discharge Orders          Ordered    doxycycline (VIBRAMYCIN) 100 MG capsule  2 times daily        04/05/21 0757             Sherwood Gambler, MD 04/05/21 0800

## 2021-04-06 ENCOUNTER — Other Ambulatory Visit: Payer: Self-pay

## 2021-04-06 ENCOUNTER — Encounter (HOSPITAL_BASED_OUTPATIENT_CLINIC_OR_DEPARTMENT_OTHER): Payer: Medicare PPO | Attending: Internal Medicine | Admitting: Internal Medicine

## 2021-04-06 DIAGNOSIS — E11622 Type 2 diabetes mellitus with other skin ulcer: Secondary | ICD-10-CM | POA: Insufficient documentation

## 2021-04-06 DIAGNOSIS — E119 Type 2 diabetes mellitus without complications: Secondary | ICD-10-CM

## 2021-04-06 DIAGNOSIS — Z7984 Long term (current) use of oral hypoglycemic drugs: Secondary | ICD-10-CM | POA: Diagnosis not present

## 2021-04-06 DIAGNOSIS — I10 Essential (primary) hypertension: Secondary | ICD-10-CM | POA: Diagnosis not present

## 2021-04-06 DIAGNOSIS — L97828 Non-pressure chronic ulcer of other part of left lower leg with other specified severity: Secondary | ICD-10-CM | POA: Diagnosis not present

## 2021-04-06 DIAGNOSIS — G4733 Obstructive sleep apnea (adult) (pediatric): Secondary | ICD-10-CM | POA: Diagnosis not present

## 2021-04-06 DIAGNOSIS — L98499 Non-pressure chronic ulcer of skin of other sites with unspecified severity: Secondary | ICD-10-CM | POA: Diagnosis not present

## 2021-04-06 NOTE — Progress Notes (Addendum)
RAYMONE, [place] ([id number]) Visit Report for 04/06/2021 Allergy List Details Patient Name: Date of Service: [name] 04/06/2021 9:00 A M Medical Record Number: [id number] Patient Account Number: [id number] Date of Birth/Sex: Treating RN: [birth year] (75 y.o. [name], [name] Primary Care Kris Burd: Elyn Peers Other Clinician: Referring Yukie Bergeron: Treating Chadric Kimberley/Extender: Arnette Schaumann Weeks in Treatment: 0 Allergies Active Allergies No Known Allergies Allergy Notes Electronic Signature(s) Signed: 04/06/2021 12:25:45 PM By: Rhae Hammock RN Entered By: Rhae Hammock on 04/06/2021 09:26:06 -------------------------------------------------------------------------------- Arrival Information Details Patient Name: Date of Service: [name], [name] 04/06/2021 9:00 A M Medical Record Number: [id number] Patient Account Number: [id number] Date of Birth/Sex: Treating RN: [birth year] (75 y.o. [name], [name] Primary Care Mario Coronado: Elyn Peers Other Clinician: Referring Cristianna Cyr: Treating Annabell Oconnor/Extender: Jeannine Boga in Treatment: 0 Visit Information Patient Arrived: Ambulatory Arrival Time: 09:24 Accompanied By: self Transfer Assistance: None Patient Identification Verified: Yes Secondary Verification Process Completed: Yes Patient Requires Transmission-Based Precautions: No Patient Has Alerts: No Electronic Signature(s) Signed: 04/06/2021 12:25:45 PM By: Rhae Hammock RN Entered By: Rhae Hammock on 04/06/2021 09:25:11 -------------------------------------------------------------------------------- Clinic Level of Care Assessment Details Patient Name: Date of Service: [name] 04/06/2021 9:00 A M Medical Record Number: [id number] Patient Account Number: [id number] Date of Birth/Sex: Treating RN: [birth year] (75 y.o. [name], [name] Primary Care Jaree Trinka: Elyn Peers Other  Clinician: Referring Demetrica Zipp: Treating Shannette Tabares/Extender: Arnette Schaumann Weeks in Treatment: 0 Clinic Level of Care Assessment Items TOOL 3 Quantity Score X- 1 0 Use when EandM and Procedure is performed on FOLLOW-UP visit ASSESSMENTS - Nursing Assessment / Reassessment X- 1 10 Reassessment of Co-morbidities (includes updates in patient status) X- 1 5 Reassessment of Adherence to Treatment Plan ASSESSMENTS - Wound and Skin Assessment / Reassessment []  - Points for Wound Assessment can only be taken for a new wound of unknown or different etiology and a procedure is 0 NOT performed to that wound X- 1 5 Simple Wound Assessment / Reassessment - one wound []  - 0 Complex Wound Assessment / Reassessment - multiple wounds []  - 0 Dermatologic / Skin Assessment (not related to wound area) ASSESSMENTS - Focused Assessment []  - 0 Circumferential Edema Measurements - multi extremities []  - 0 Nutritional Assessment / Counseling / Intervention []  - 0 Lower Extremity Assessment (monofilament, tuning fork, pulses) []  - 0 Peripheral Arterial Disease Assessment (using hand held doppler) ASSESSMENTS - Ostomy and/or Continence Assessment and Care []  - 0 Incontinence Assessment and Management []  - 0 Ostomy Care Assessment and Management (repouching, etc.) PROCESS - Coordination of Care []  - Points for Discharge Coordination can only be taken for a new wound of unknown or different etiology and a procedure 0 is NOT performed to that wound X- 1 15 Simple Patient / Family Education for ongoing care []  - 0 Complex (extensive) Patient / Family Education for ongoing care X- 1 10 Staff obtains Programmer, systems, Records, T Results / Process Orders est []  - 0 Staff telephones HHA, Nursing Homes / Clarify orders / etc []  - 0 Routine Transfer to another Facility (non-emergent condition) []  - 0 Routine Hospital Admission (non-emergent condition) X- 1 15 New Admissions / Medical laboratory scientific officer / Ordering NPWT Apligraf, etc. , []  - 0 Emergency Hospital Admission (emergent condition) X- 1 10 Simple Discharge Coordination []  - 0 Complex (extensive) Discharge Coordination PROCESS - Special Needs []  - 0 Pediatric / Minor Patient Management []  - 0 Isolation  Patient Management []  - 0 Hearing / Language / Visual special needs []  - 0 Assessment of Community assistance (transportation, D/C planning, etc.) []  - 0 Additional assistance / Altered mentation []  - 0 Support Surface(s) Assessment (bed, cushion, seat, etc.) INTERVENTIONS - Wound Cleansing / Measurement []  - Points for Wound Cleaning / Measurement, Wound Dressing, Specimen Collection and Specimen taken to lab can only 0 be taken for a new wound of unknown or different etiology and a procedure is NOT performed to that wound X- 1 5 Simple Wound Cleansing - one wound []  - 0 Complex Wound Cleansing - multiple wounds X- 1 5 Wound Imaging (photographs - any number of wounds) []  - 0 Wound Tracing (instead of photographs) X- 1 5 Simple Wound Measurement - one wound []  - 0 Complex Wound Measurement - multiple wounds INTERVENTIONS - Wound Dressings X - Small Wound Dressing one or multiple wounds 1 10 []  - 0 Medium Wound Dressing one or multiple wounds []  - 0 Large Wound Dressing one or multiple wounds INTERVENTIONS - Miscellaneous []  - 0 External ear exam []  - 0 Specimen Collection (cultures, biopsies, blood, body fluids, etc.) []  - 0 Specimen(s) / Culture(s) sent or taken to Lab for analysis []  - 0 Patient Transfer (multiple staff / Civil Service fast streamer / Similar devices) []  - 0 Simple Staple / Suture removal (25 or less) []  - 0 Complex Staple / Suture removal (26 or more) []  - 0 Hypo / Hyperglycemic Management (close monitor of Blood Glucose) []  - 0 Ankle / Brachial Index (ABI) - do not check if billed separately X- 1 5 Vital Signs Has the patient been seen at the hospital within the last three  years: Yes Total Score: 100 Level Of Care: New/Established - Level 3 Electronic Signature(s) Signed: 04/06/2021 12:25:45 PM By: Rhae Hammock RN Entered By: Rhae Hammock on 04/06/2021 10:48:05 -------------------------------------------------------------------------------- Encounter Discharge Information Details Patient Name: Date of Service: [name], [name] 04/06/2021 9:00 A M Medical Record Number: [id number] Patient Account Number: [id number] Date of Birth/Sex: Treating RN: 1946/05/05 (75 y.o. [name], [name] Primary Care Charlean Carneal: Elyn Peers Other Clinician: Referring Inez Stantz: Treating Teaira Croft/Extender: Arnette Schaumann Weeks in Treatment: 0 Encounter Discharge Information Items Post Procedure Vitals Discharge Condition: Stable Temperature (F): 97.4 Ambulatory Status: Ambulatory Pulse (bpm): 74 Discharge Destination: Home Respiratory Rate (breaths/min): 17 Transportation: Private Auto Blood Pressure (mmHg): 134/74 Accompanied By: self Schedule Follow-up Appointment: Yes Clinical Summary of Care: Patient Declined Electronic Signature(s) Signed: 04/06/2021 12:25:45 PM By: Rhae Hammock RN Entered By: Rhae Hammock on 04/06/2021 10:50:37 -------------------------------------------------------------------------------- Lower Extremity Assessment Details Patient Name: Date of Service: [name] 04/06/2021 9:00 A M Medical Record Number: [id number] Patient Account Number: [id number] Date of Birth/Sex: Treating RN: 1946-03-04 (75 y.o. [name], [name] Primary Care Journey Cast[place]: Elyn Peers Other Clinician: Referring Decker Cogdell: Treating Virgilio Broadhead/Extender: Arnette Schaumann Weeks in Treatment: 0 Edema Assessment Assessed: [Left: Yes] [Right: No] Edema: [Left: Ye] [Right: s] Calf Left: Right: Point of Measurement: 8 cm From Medial Instep 39 cm Ankle Left: Right: Point of Measurement: 41 cm From Medial  Instep 8 cm Vascular Assessment Pulses: Dorsalis Pedis Palpable: [Left:Yes] Posterior Tibial Palpable: [Left:Yes] Electronic Signature(s) Signed: 04/06/2021 12:25:45 PM By: Rhae Hammock RN Entered By: Rhae Hammock on 04/06/2021 09:36:17 -------------------------------------------------------------------------------- Multi Wound Chart Details Patient Name: Date of Service: [name], [name] 04/06/2021 9:00 A M Medical Record Number: [id number] Patient Account Number: [id number] Date of Birth/Sex: Treating RN: 09/26/1945 (  75 y.o. Ernestene Mention Primary Care Kenyana Husak: Elyn Peers Other Clinician: Referring Ameya Vowell: Treating Lynanne Delgreco/Extender: Arnette Schaumann Weeks in Treatment: 0 Vital Signs Height(in): 72 Pulse(bpm): 74 Weight(lbs): 195 Blood Pressure(mmHg): 121/77 Body Mass Index(BMI): 26 Temperature(F): 98.3 Respiratory Rate(breaths/min): 17 Photos: [1:Left, Anterior Lower Leg] [N/A:N/A N/A] Wound Location: [1:Trauma] [N/A:N/A] Wounding Event: [1:Trauma, Other] [N/A:N/A] Primary Etiology: [1:Hypertension] [N/A:N/A] Comorbid History: [1:02/08/2021] [N/A:N/A] Date Acquired: [1:0] [N/A:N/A] Weeks of Treatment: [1:Open] [N/A:N/A] Wound Status: [1:Yes] [N/A:N/A] Clustered Wound: [1:4x3.3x1.2] [N/A:N/A] Measurements L x W x D (cm) [1:10.367] [N/A:N/A] A (cm) : rea [1:12.441] [N/A:N/A] Volume (cm) : [1:0.00%] [N/A:N/A] % Reduction in A rea: [1:0.00%] [N/A:N/A] % Reduction in Volume: [1:11] Position 1 (o'clock): [1:2.7] Maximum Distance 1 (cm): [1:12] Starting Position 1 (o'clock): [1:12] Ending Position 1 (o'clock): [1:0.5] Maximum Distance 1 (cm): [1:Yes] [N/A:N/A] Tunneling: [1:Yes] [N/A:N/A] Undermining: [1:Full Thickness Without Exposed] [N/A:N/A] Classification: [1:Support Structures Medium] [N/A:N/A] Exudate Amount: [1:Purulent] [N/A:N/A] Exudate Type: [1:yellow, brown, green] [N/A:N/A] Exudate Color: [1:Distinct, outline  attached] [N/A:N/A] Wound Margin: [1:Small (1-33%)] [N/A:N/A] Granulation Amount: [1:Red, Pink] [N/A:N/A] Granulation Quality: [1:Large (67-100%)] [N/A:N/A] Necrotic Amount: [1:Fat Layer (Subcutaneous Tissue): Yes N/A] Exposed Structures: [1:Fascia: No Tendon: No Muscle: No Joint: No Bone: No Biopsy] [N/A:N/A] Treatment Notes Wound #1 (Lower Leg) Wound Laterality: Left, Anterior Cleanser Soap and Water Discharge Instruction: May shower and wash wound with dial antibacterial soap and water prior to dressing change. Wound Cleanser Discharge Instruction: Cleanse the wound with wound cleanser prior to applying a clean dressing using gauze sponges, not tissue or cotton balls. Peri-Wound Care Topical Primary Dressing Hydrofera Blue Classic Foam, 4x4 in Discharge Instruction: Moisten with saline prior to applying to wound bed Secondary Dressing Woven Gauze Sponge, Non-Sterile 4x4 in Discharge Instruction: Apply over primary dressing as directed. ABD Pad, 5x9 Discharge Instruction: Apply over primary dressing as directed. Secured With [employer], 4.5x3.1 (in/yd) Discharge Instruction: Secure with Kerlix as directed. 52M Medipore H Soft Cloth Surgical T ape, 4 x 10 (in/yd) Discharge Instruction: Secure with tape as directed. Compression Wrap Compression Stockings Add-Ons Electronic Signature(s) Signed: 04/06/2021 3:30:02 PM By: Kalman Shan DO Signed: 04/10/2021 4:07:03 PM By: Baruch Gouty RN, BSN Entered By: Kalman Shan on 04/06/2021 15:12:17 -------------------------------------------------------------------------------- [place] Details Patient Name: Date of Service: [name], [name] 04/06/2021 9:00 A M Medical Record Number: [id number] Patient Account Number: [id number] Date of Birth/Sex: Treating RN: 1945-09-10 (75 y.o. [name], [name] Primary Care Graclynn Vanantwerp: Elyn Peers Other Clinician: Referring Veda Arrellano: Treating  Layloni Fahrner/Extender: Arnette Schaumann Weeks in Treatment: 0 Active Inactive Orientation to the Wound Care Program Nursing Diagnoses: Knowledge deficit related to the wound healing center program Goals: Patient/caregiver will verbalize understanding of the [place] Date Initiated: 04/06/2021 Target Resolution Date: 04/13/2021 Goal Status: Active Interventions: Provide education on orientation to the wound center Notes: Wound/Skin Impairment Nursing Diagnoses: Impaired tissue integrity Knowledge deficit related to ulceration/compromised skin integrity Goals: Patient will have a decrease in wound volume by X% from date: (specify in notes) Date Initiated: 04/06/2021 Target Resolution Date: 04/12/2021 Goal Status: Active Patient/caregiver will verbalize understanding of skin care regimen Date Initiated: 04/06/2021 Target Resolution Date: 06/13/2021 Goal Status: Active Ulcer/skin breakdown will have a volume reduction of 30% by week 4 Date Initiated: 04/06/2021 Target Resolution Date: 04/13/2021 Goal Status: Active Ulcer/skin breakdown will have a volume reduction of 50% by week 8 Date Initiated: 04/06/2021 Target Resolution Date: 04/13/2021 Goal Status: Active Interventions: Assess patient/caregiver ability to obtain necessary supplies  Assess patient/caregiver ability to perform ulcer/skin care regimen upon admission and as needed Assess ulceration(s) every visit Notes: Electronic Signature(s) Signed: 04/06/2021 12:25:45 PM By: Rhae Hammock RN Entered By: Rhae Hammock on 04/06/2021 10:06:32 -------------------------------------------------------------------------------- Pain Assessment Details Patient Name: Date of Service: [name], [name] 04/06/2021 9:00 A M Medical Record Number: [id number] Patient Account Number: [id number] Date of Birth/Sex: Treating RN: 1945-08-31 (75 y.o. [name], [name] Primary Care Shanasia Ibrahim: Elyn Peers Other Clinician: Referring Kashina Mecum: Treating Treasure Ingrum/Extender: Arnette Schaumann Weeks in Treatment: 0 Active Problems Location of Pain Severity and Description of Pain Patient Has Paino No Site Locations Pain Management and Medication Current Pain Management: Electronic Signature(s) Signed: 04/06/2021 12:25:45 PM By: Rhae Hammock RN Entered By: Rhae Hammock on 04/06/2021 09:28:04 -------------------------------------------------------------------------------- Patient/Caregiver Education Details Patient Name: Date of Service: Orvan July 10/28/2022andnbsp9:00 A M Medical Record Number: [id number] Patient Account Number: [id number] Date of Birth/Gender: Treating RN: 1945-08-27 (55 y.o. [name] Primary Care Physician: Elyn Peers Other Clinician: Referring Physician: Treating Physician/Extender: Jeannine Boga in Treatment: 0 Education Assessment Education Provided To: Patient Education Topics Provided Welcome T [facility]: o Methods: Explain/Verbal Responses: Reinforcements needed, State content correctly Electronic Signature(s) Signed: 04/06/2021 12:25:45 PM By: Rhae Hammock RN Entered By: Rhae Hammock on 04/06/2021 10:06:41 -------------------------------------------------------------------------------- Wound Assessment Details Patient Name: Date of Service: [name] 04/06/2021 9:00 A M Medical Record Number: [id number] Patient Account Number: [id number] Date of Birth/Sex: Treating RN: 10-Dec-1945 (75 y.o. [name], [name] Primary Care Kadie Balestrieri: Elyn Peers Other Clinician: Referring Anastaisa Wooding: Treating Chayden Garrelts/Extender: Arnette Schaumann Weeks in Treatment: 0 Wound Status Wound Number: 1 Primary Etiology: Trauma, Other Wound Location: Left, Anterior Lower Leg Wound Status: Open Wounding Event: Trauma Comorbid History:  Hypertension Date Acquired: 02/08/2021 Weeks Of Treatment: 0 Clustered Wound: Yes Photos Wound Measurements Length: (cm) 4 Width: (cm) 3.3 Depth: (cm) 1.2 Area: (cm) 10.367 Volume: (cm) 12.441 % Reduction in Area: 0% % Reduction in Volume: 0% Tunneling: Yes Position (o'clock): 11 Maximum Distance: (cm) 2.7 Undermining: Yes Starting Position (o'clock): 12 Ending Position (o'clock): 12 Maximum Distance: (cm) 0.5 Wound Description Classification: Full Thickness Without Exposed Support Structures Wound Margin: Distinct, outline attached Exudate Amount: Medium Exudate Type: Purulent Exudate Color: yellow, brown, green Foul Odor After Cleansing: No Slough/Fibrino Yes Wound Bed Granulation Amount: Small (1-33%) Exposed Structure Granulation Quality: Red, Pink Fascia Exposed: No Necrotic Amount: Large (67-100%) Fat Layer (Subcutaneous Tissue) Exposed: Yes Necrotic Quality: Adherent Slough Tendon Exposed: No Muscle Exposed: No Joint Exposed: No Bone Exposed: No Treatment Notes Wound #1 (Lower Leg) Wound Laterality: Left, Anterior Cleanser Soap and Water Discharge Instruction: May shower and wash wound with dial antibacterial soap and water prior to dressing change. Wound Cleanser Discharge Instruction: Cleanse the wound with wound cleanser prior to applying a clean dressing using gauze sponges, not tissue or cotton balls. Peri-Wound Care Topical Primary Dressing Hydrofera Blue Classic Foam, 4x4 in Discharge Instruction: Moisten with saline prior to applying to wound bed Secondary Dressing Woven Gauze Sponge, Non-Sterile 4x4 in Discharge Instruction: Apply over primary dressing as directed. ABD Pad, 5x9 Discharge Instruction: Apply over primary dressing as directed. Secured With [employer], 4.5x3.1 (in/yd) Discharge Instruction: Secure with Kerlix as directed. 34M Medipore H Soft Cloth Surgical T ape, 4 x 10 (in/yd) Discharge Instruction: Secure with tape as  directed. Compression Wrap Compression Stockings Add-Ons Electronic Signature(s) Signed: 04/06/2021 12:25:45 PM By: Hollie Salk,  [name] RN Signed: 04/06/2021 12:29:57 PM By: Dellie Catholic RN Entered By: Dellie Catholic on 04/06/2021 09:54:31 -------------------------------------------------------------------------------- Vitals Details Patient Name: Date of Service: [name], [name] 04/06/2021 9:00 A M Medical Record Number: [id number] Patient Account Number: [id number] Date of Birth/Sex: Treating RN: September 27, 1945 (75 y.o. [name], [name] Primary Care Fredric Slabach: Elyn Peers Other Clinician: Referring Kimoni Pickerill: Treating Javaris Wigington/Extender: Arnette Schaumann Weeks in Treatment: 0 Vital Signs Time Taken: 09:25 Temperature (F): 98.3 Height (in): 72 Pulse (bpm): 74 Source: Stated Respiratory Rate (breaths/min): 17 Weight (lbs): 195 Blood Pressure (mmHg): 121/77 Source: Stated Reference Range: 80 - 120 mg / dl Body Mass Index (BMI): 26.4 Electronic Signature(s) Signed: 04/06/2021 12:25:45 PM By: Rhae Hammock RN Entered By: Rhae Hammock on 04/06/2021 09:25:55

## 2021-04-06 NOTE — Progress Notes (Signed)
Larry Welch ([id number]) Visit Report for 04/06/2021 Abuse/Suicide Risk Screen Details Patient Name: Date of Service: Larry Welch RLES E. 04/06/2021 9:00 A M Medical Record Number: [id number] Patient Account Number: [id number] Date of Birth/Sex: Treating RN: August 07, 1945 (75 y.o. Larry Welch, Larry Welch Imonie Tuch: Elyn Peers Other Clinician: Referring Joaovictor Krone: Treating Emaley Applin/Extender: Arnette Schaumann Weeks in Treatment: 0 Abuse/Suicide Risk Screen Items Answer ABUSE RISK SCREEN: Has anyone close to you tried to hurt or harm you recentlyo No Do you feel uncomfortable with anyone in your familyo No Has anyone forced you do things that you didnt want to doo No Electronic Signature(s) Signed: 04/06/2021 12:25:45 PM By: Rhae Hammock RN Entered By: Rhae Hammock on 04/06/2021 09:26:22 -------------------------------------------------------------------------------- Activities of Daily Living Details Patient Name: Date of Service: Larry Welch RLES E. 04/06/2021 9:00 A M Medical Record Number: [id number] Patient Account Number: [id number] Date of Birth/Sex: Treating RN: [birth year] (25 y.o. Larry Welch, Larry Welch Renu Asby: Elyn Peers Other Clinician: Referring Zedrick Springsteen: Treating Saarah Dewing/Extender: Arnette Schaumann Weeks in Treatment: 0 Activities of Daily Living Items Answer Activities of Daily Living (Please select one for each item) Drive Automobile Completely Able T Medications ake Completely Able Use T elephone Completely Able Welch for Appearance Completely Able Use T oilet Completely Able Bath / Shower Completely Able Dress Self Completely Able Feed Self Completely Able Walk Completely Able Get In / Out Bed Completely Able Housework Completely Able Prepare Meals Completely [place] for Self Completely Able Electronic Signature(s) Signed: 04/06/2021 12:25:45 PM By: Rhae Hammock RN Entered By: Rhae Hammock on 04/06/2021 09:26:45 -------------------------------------------------------------------------------- Education Screening Details Patient Name: Date of Service: Larry Welch RLES E. 04/06/2021 9:00 A M Medical Record Number: [id number] Patient Account Number: [id number] Date of Birth/Sex: Treating RN: 26-Jul-1945 (75 y.o. Larry Welch, Larry Welch Contina Strain: Elyn Peers Other Clinician: Referring Lessa Huge: Treating Donnell Wion/Extender: Jeannine Boga in Treatment: 0 Primary Learner Assessed: Patient Learning Preferences/Education Level/Primary Language Learning Preference: Explanation, Demonstration, Communication Board, Printed Material Highest Education Level: College or Above Preferred Language: English Cognitive Barrier Language Barrier: No Translator Needed: No Memory Deficit: No Emotional Barrier: No Cultural/Religious Beliefs Affecting Medical Welch: No Physical Barrier Impaired Vision: Yes Glasses Impaired Hearing: No Decreased Hand dexterity: No Knowledge/Comprehension Knowledge Level: High Comprehension Level: High Ability to understand written instructions: High Ability to understand verbal instructions: High Motivation Anxiety Level: Calm Cooperation: Cooperative Education Importance: Denies Need Interest in Health Problems: Asks Questions Perception: Coherent Willingness to Engage in Self-Management High Activities: Readiness to Engage in Self-Management High Activities: Electronic Signature(s) Signed: 04/06/2021 12:25:45 PM By: Rhae Hammock RN Entered By: Rhae Hammock on 04/06/2021 09:27:07 -------------------------------------------------------------------------------- Fall Risk Assessment Details Patient Name: Date of Service: Larry Saha, [name] 04/06/2021 9:00 A M Medical Record Number: [id number] Patient Account Number: [id number] Date of Birth/Sex: Treating  RN: 01-24-1946 (76 y.o. Larry Welch, Larry Welch Larry Welch: Elyn Peers Other Clinician: Referring Arisha Gervais: Treating Shermaine Rivet/Extender: Arnette Schaumann Weeks in Treatment: 0 Fall Risk Assessment Items Have you had 2 or more falls in the last 12 monthso 0 No Have you had any fall that resulted in injury in the last 12 monthso 0 No FALLS RISK SCREEN History of falling - immediate or within 3 months 0 No Secondary diagnosis (Do you have 2 or more medical diagnoseso) 0 No Ambulatory aid None/bed rest/wheelchair/nurse 0 No Crutches/cane/walker 0 No Furniture 0 No  Intravenous therapy Access/Saline/Heparin Lock 0 No Gait/Transferring Normal/ bed rest/ wheelchair 0 No Weak (short steps with or without shuffle, stooped but able to lift head while walking, may seek 0 No support from furniture) Impaired (short steps with shuffle, may have difficulty arising from chair, head down, impaired 0 No balance) Mental Status Oriented to own ability 0 No Electronic Signature(s) Signed: 04/06/2021 12:25:45 PM By: Rhae Hammock RN Entered By: Rhae Hammock on 04/06/2021 09:27:13 -------------------------------------------------------------------------------- Foot Assessment Details Patient Name: Date of Service: Larry Saha, [name] 04/06/2021 9:00 A M Medical Record Number: [id number] Patient Account Number: [id number] Date of Birth/Sex: Treating RN: [birth year] (75 y.o. Larry Welch, Larry Welch Gearline Spilman: Elyn Peers Other Clinician: Referring Jaslen Adcox: Treating Erica Richwine/Extender: Arnette Schaumann Weeks in Treatment: 0 Foot Assessment Items Site Locations + = Sensation present, - = Sensation absent, C = Callus, U = Ulcer R = Redness, W = Warmth, M = Maceration, PU = Pre-ulcerative lesion F = Fissure, S = Swelling, D = Dryness Assessment Right: Left: Other Deformity: No No Prior Foot Ulcer: No No Prior Amputation: No No Charcot  Joint: No No Ambulatory Status: Ambulatory Without Help Gait: Steady Electronic Signature(s) Signed: 04/06/2021 12:25:45 PM By: Rhae Hammock RN Entered By: Rhae Hammock on 04/06/2021 10:05:24 -------------------------------------------------------------------------------- Nutrition Risk Screening Details Patient Name: Date of Service: Larry Welch RLES E. 04/06/2021 9:00 A M Medical Record Number: [id number] Patient Account Number: [id number] Date of Birth/Sex: Treating RN: June 08, 1946 (75 y.o. Larry Welch, Larry Welch Timika Muench: Elyn Peers Other Clinician: Referring Eartha Vonbehren: Treating Chidera Thivierge/Extender: Arnette Schaumann Weeks in Treatment: 0 Height (in): 72 Weight (lbs): 195 Body Mass Index (BMI): 26.4 Nutrition Risk Screening Items Score Screening NUTRITION RISK SCREEN: I have an illness or condition that made me change the kind and/or amount of food I eat 0 No I eat fewer than two meals per day 0 No I eat few fruits and vegetables, or milk products 0 No I have three or more drinks of beer, liquor or wine almost every day 0 No I have tooth or mouth problems that make it hard for me to eat 0 No I don't always have enough money to buy the food I need 0 No I eat alone most of the time 0 No I take three or more different prescribed or over-the-counter drugs a day 0 No Without wanting to, I have lost or gained 10 pounds in the last six months 0 No I am not always physically able to shop, cook and/or feed myself 0 No Nutrition Protocols Good Risk Protocol 0 No interventions needed Moderate Risk Protocol High Risk Proctocol Risk Level: Good Risk Score: 0 Electronic Signature(s) Signed: 04/06/2021 12:25:45 PM By: Rhae Hammock RN Entered By: Rhae Hammock on 04/06/2021 09:27:38

## 2021-04-06 NOTE — Progress Notes (Addendum)
KAYSTON, JODOIN ([id number]) Visit Report for 04/06/2021 Biopsy Details Patient Name: Date of Service: [name] 04/06/2021 9:00 A M Medical Record Number: [id number] Patient Account Number: [id number] Date of Birth/Sex: Treating RN: February 24, 1946 (75 y.o. [name], [name] Primary Care Provider: Elyn Peers Other Clinician: Referring Provider: Treating Provider/Extender: Arnette Schaumann Weeks in Treatment: 0 Biopsy Performed for: Wound #1 Left, Anterior Lower Leg Performed By: Physician Kalman Shan, DO Tissue Punch: Yes Size (mm): 4 Number of Specimens T aken: 1 Specimen Sent T Pathology: o Yes Level of Consciousness (Pre-procedure): Awake and Alert Pre-procedure Verification/Time-Out Taken: Yes - 10:00 Pain Control: Lidocaine Injectable Bleeding: Minimum Hemostasis Achieved: Pressure Procedural Pain: 0 Post Procedural Pain: 0 Response to Treatment: Procedure was tolerated well Level of Consciousness (Post-procedure): Awake and Alert Post Procedure Diagnosis Same as Pre-procedure Electronic Signature(s) Signed: 04/06/2021 12:25:45 PM By: Rhae Hammock RN Signed: 04/06/2021 3:30:02 PM By: Kalman Shan DO Entered By: Rhae Hammock on 04/06/2021 10:34:33 -------------------------------------------------------------------------------- Chief Complaint Document Details Patient Name: Date of Service: [name], [name] 04/06/2021 9:00 A M Medical Record Number: [id number] Patient Account Number: [id number] Date of Birth/Sex: Treating RN: December 19, 1945 (75 y.o. Ernestene Mention Primary Care Provider: Elyn Peers Other Clinician: Referring Provider: Treating Provider/Extender: Arnette Schaumann Weeks in Treatment: 0 Information Obtained from: Patient Chief Complaint Left lower extremity wound Electronic Signature(s) Signed: 04/06/2021 3:30:02 PM By: Kalman Shan DO Entered By: Kalman Shan on 04/06/2021  15:12:35 -------------------------------------------------------------------------------- HPI Details Patient Name: Date of Service: [name], [name] 04/06/2021 9:00 A M Medical Record Number: [id number] Patient Account Number: [id number] Date of Birth/Sex: Treating RN: 03/01/1946 (75 y.o. Ernestene Mention Primary Care Provider: Elyn Peers Other Clinician: Referring Provider: Treating Provider/Extender: Arnette Schaumann Weeks in Treatment: 0 History of Present Illness HPI Description: Admission 04/06/2021 Mr. Nick Stults is a 75 year old male with a past medical history of type 2 diabetes on oral medications, essential hypertension and OSA who presents to the clinic for a 78-month history of nonhealing wound to his left lower extremity. He states that he developed a wound from a pressure washer in the beginning of September. He states that the wound had improved to the point that it almost closed and then became significantly worse in the past couple weeks. He visited the ED on 04/04/2021 for this issue and was started on doxycycline. He currently denies signs of infection. Electronic Signature(s) Signed: 04/06/2021 3:30:02 PM By: Kalman Shan DO Entered By: Kalman Shan on 04/06/2021 15:16:28 -------------------------------------------------------------------------------- Physical Exam Details Patient Name: Date of Service: [name] 04/06/2021 9:00 A M Medical Record Number: [id number] Patient Account Number: [id number] Date of Birth/Sex: Treating RN: 05/30/1946 (75 y.o. Ernestene Mention Primary Care Provider: Elyn Peers Other Clinician: Referring Provider: Treating Provider/Extender: Arnette Schaumann Weeks in Treatment: 0 Constitutional respirations regular, non-labored and within target range for patient.. Cardiovascular 2+ dorsalis pedis/posterior tibialis pulses. Psychiatric pleasant and cooperative. Notes 3  punched-out lesions to the anterior shin with a violaceous border with nonviable tissue throughout and scant granulation tissue. No obvious signs of infection. Electronic Signature(s) Signed: 04/06/2021 3:30:02 PM By: Kalman Shan DO Entered By: Kalman Shan on 04/06/2021 15:21:56 -------------------------------------------------------------------------------- Physician Orders Details Patient Name: Date of Service: [name], [name] 04/06/2021 9:00 A M Medical Record Number: [id number] Patient Account Number: [id number] Date of Birth/Sex: Treating RN: 06/04/1946 (8 y.o. [name] Primary Care  Provider: Elyn Peers Other Clinician: Referring Provider: Treating Provider/Extender: Arnette Schaumann Weeks in Treatment: 0 Verbal / Phone Orders: No Diagnosis Coding ICD-10 Coding Code Description [phone] Non-pressure chronic ulcer of other part of left lower leg with other specified severity I10 Essential (primary) hypertension E11.9 Type 2 diabetes mellitus without complications Follow-up Appointments ppointment in 1 week. - Dr. Heber [place] Return A Bathing/ Shower/ Hygiene May shower with protection but do not get wound dressing(s) wet. Edema Control - Lymphedema / SCD / Other Elevate legs to the level of the heart or above for 30 minutes daily and/or when sitting, a frequency of: Avoid standing for long periods of time. Wound Treatment Wound #1 - Lower Leg Wound Laterality: Left, Anterior Cleanser: Soap and Water Every Other Day/15 Days Discharge Instructions: May shower and wash wound with dial antibacterial soap and water prior to dressing change. Cleanser: Wound Cleanser (DME) (Generic) Every Other Day/15 Days Discharge Instructions: Cleanse the wound with wound cleanser prior to applying a clean dressing using gauze sponges, not tissue or cotton balls. Prim Dressing: Hydrofera Blue Classic Foam, 4x4 in (DME) (Generic) Every Other Day/15  Days ary Discharge Instructions: Moisten with saline prior to applying to wound bed Secondary Dressing: Woven Gauze Sponge, Non-Sterile 4x4 in (DME) (Generic) Every Other Day/15 Days Discharge Instructions: Apply over primary dressing as directed. Secondary Dressing: ABD Pad, 5x9 (DME) (Generic) Every Other Day/15 Days Discharge Instructions: Apply over primary dressing as directed. Secured With: [employer], 4.5x3.1 (in/yd) (DME) (Generic) Every Other Day/15 Days Discharge Instructions: Secure with Kerlix as directed. Secured With: 71M Medipore H Soft Cloth Surgical T ape, 4 x 10 (in/yd) (DME) (Generic) Every Other Day/15 Days Discharge Instructions: Secure with tape as directed. Laboratory Bacteria identified in Tissue by Biopsy culture (MICRO) - Right Anterior Lower Leg LOINC Code: [id number] Convenience Name: Biopsy specimen culture Services and Therapies nkle Brachial Index (ABI) - w/ TBI's A Electronic Signature(s) Signed: 04/12/2021 11:08:25 AM By: Kalman Shan DO Signed: 06/28/2021 12:39:04 PM By: Rhae Hammock RN Previous Signature: 04/06/2021 3:30:02 PM Version By: Kalman Shan DO Previous Signature: 04/06/2021 12:25:45 PM Version By: Rhae Hammock RN Entered By: Rhae Hammock on 04/12/2021 09:07:33 Prescription 04/06/2021 -------------------------------------------------------------------------------- Belcastro, Kymani E. Kalman Shan DO Patient Name: Provider: March 09, 1946 [id number] Date of Birth: NPI#: Jerilynn Mages [id number] Sex: DEA #: [phone] Phone #: License #: [employer] Patient Address: [address] [place] [address] , [place], [place] [address] [phone] Allergies No Known Allergies Provider's Orders nkle Brachial Index (ABI) - w/ TBI's A Hand Signature: Date(s): Electronic Signature(s) Signed: 04/12/2021 11:08:25 AM By: Kalman Shan  DO Signed: 06/28/2021 12:39:04 PM By: Rhae Hammock RN Entered By: Rhae Hammock on 04/12/2021 09:07:33 -------------------------------------------------------------------------------- Problem List Details Patient Name: Date of Service: [name], [name] 04/06/2021 9:00 A M Medical Record Number: [id number] Patient Account Number: [id number] Date of Birth/Sex: Treating RN: 09-Feb-1946 (75 y.o. Ernestene Mention Primary Care Provider: Elyn Peers Other Clinician: Referring Provider: Treating Provider/Extender: Arnette Schaumann Weeks in Treatment: 0 Active Problems ICD-10 Encounter Code Description Active Date MDM Diagnosis [phone] Non-pressure chronic ulcer of other part of left lower leg with other specified 04/06/2021 No Yes severity I10 Essential (primary) hypertension 04/06/2021 No Yes E11.9 Type 2 diabetes mellitus without complications 41/63/8453 No Yes Inactive Problems Resolved Problems Electronic Signature(s) Signed: 04/06/2021 3:30:02 PM By: Kalman Shan DO Entered By: Kalman Shan on 04/06/2021  15:12:07 -------------------------------------------------------------------------------- Progress Note Details Patient Name: Date of Service: [name] 04/06/2021 9:00 A M Medical Record Number: [id number] Patient Account Number: [id number] Date of Birth/Sex: Treating RN: 1946-03-30 (75 y.o. Ernestene Mention Primary Care Provider: Elyn Peers Other Clinician: Referring Provider: Treating Provider/Extender: Arnette Schaumann Weeks in Treatment: 0 Subjective Chief Complaint Information obtained from Patient Left lower extremity wound History of Present Illness (HPI) Admission 04/06/2021 Mr. Kamaal Cast is a 75 year old male with a past medical history of type 2 diabetes on oral medications, essential hypertension and OSA who presents to the clinic for a 63-month history of nonhealing wound to his left lower  extremity. He states that he developed a wound from a pressure washer in the beginning of September. He states that the wound had improved to the point that it almost closed and then became significantly worse in the past couple weeks. He visited the ED on 04/04/2021 for this issue and was started on doxycycline. He currently denies signs of infection. Patient History Information obtained from Patient, Chart. Allergies No Known Allergies Family History Unknown History. Social History Never smoker, Marital Status - Married, Alcohol Use - Rarely, Drug Use - No History, Caffeine Use - Rarely. Medical History Cardiovascular Patient has history of Hypertension Denies history of Angina, Arrhythmia, Congestive Heart Failure, Coronary Artery Disease, Deep Vein Thrombosis, Hypotension, Myocardial Infarction, Peripheral Arterial Disease, Peripheral Venous Disease, Phlebitis, Vasculitis Integumentary (Skin) Denies history of History of Burn Hospitalization/Surgery History - back surgery/foot surgery/ radiation plant sreed. Medical A Surgical History Notes nd Cardiovascular hyperlipdemia Endocrine pre0diabetic Oncologic hx of prostate ca Review of Systems (ROS) Constitutional Symptoms (General Health) Denies complaints or symptoms of Fatigue, Fever, Chills, Marked Weight Change. Eyes Denies complaints or symptoms of Dry Eyes, Vision Changes, Glasses / Contacts. Ear/Nose/Mouth/Throat Denies complaints or symptoms of Chronic sinus problems or rhinitis. Respiratory Denies complaints or symptoms of Chronic or frequent coughs, Shortness of Breath. Cardiovascular Denies complaints or symptoms of Chest pain. Gastrointestinal Denies complaints or symptoms of Frequent diarrhea, Nausea, Vomiting. Genitourinary Denies complaints or symptoms of Frequent urination. Integumentary (Skin) Complains or has symptoms of Wounds. Musculoskeletal Denies complaints or symptoms of Muscle Pain, Muscle  Weakness. Neurologic Denies complaints or symptoms of Numbness/parasthesias. Psychiatric Denies complaints or symptoms of Claustrophobia, Suicidal. Objective Constitutional respirations regular, non-labored and within target range for patient.. Vitals Time Taken: 9:25 AM, Height: 72 in, Source: Stated, Weight: 195 lbs, Source: Stated, BMI: 26.4, Temperature: 98.3 F, Pulse: 74 bpm, Respiratory Rate: 17 breaths/min, Blood Pressure: 121/77 mmHg. Cardiovascular 2+ dorsalis pedis/posterior tibialis pulses. Psychiatric pleasant and cooperative. General Notes: 3 punched-out lesions to the anterior shin with a violaceous border with nonviable tissue throughout and scant granulation tissue. No obvious signs of infection. Integumentary (Hair, Skin) Wound #1 status is Open. Original cause of wound was Trauma. The date acquired was: 02/08/2021. The wound is located on the Left,Anterior Lower Leg. The wound measures 4cm length x 3.3cm width x 1.2cm depth; 10.367cm^2 area and 12.441cm^3 volume. There is Fat Layer (Subcutaneous Tissue) exposed. Tunneling has been noted at 11:00 with a maximum distance of 2.7cm. Undermining begins at 12:00 and ends at 12:00 with a maximum distance of 0.5cm. There is a medium amount of purulent drainage noted. The wound margin is distinct with the outline attached to the wound base. There is small (1-33%) red, pink granulation within the wound bed. There is a large (67-100%) amount of necrotic tissue within the wound bed including Adherent Slough. Assessment  Active Problems ICD-10 Non-pressure chronic ulcer of other part of left lower leg with other specified severity Essential (primary) hypertension Type 2 diabetes mellitus without complications Patient presents with an open wound following trauma. He states that the wound had improved however recently gotten worse. The presence appears atypical to me and I recommended a biopsy. My differential includes pyoderma  gangrenosum versus vasculitis vs ulcerative inflammatory disorder. I also recommended using Hydrofera Blue for daily dressing changes. I will hold off on debridement as I am concerned that I could worsen the wound at this time. It does not have the typical appearance of a trauma wound. Does not appear infected. 47 minutes was spent on the encounter including face-to-face, EMR review and coordination of care Procedures Wound #1 Pre-procedure diagnosis of Wound #1 is a Trauma, Other located on the Left, Anterior Lower Leg . There was a biopsy performed by Kalman Shan, DO. The skin was cleansed and prepped with anti-septic followed by pain control using Lidocaine Injectable. Utilizing a 4 mm tissue punch, tissue was removed at its base and sent to pathology. A Minimum amount of bleeding was controlled with Pressure. A time out was conducted at 10:00, prior to the start of the procedure. The procedure was tolerated well with a pain level of 0 throughout and a pain level of 0 following the procedure. Post procedure Diagnosis Wound #1: Same as Pre-Procedure Plan Follow-up Appointments: Return Appointment in 1 week. - Dr. Heber [place] Bathing/ Shower/ Hygiene: May shower with protection but do not get wound dressing(s) wet. Edema Control - Lymphedema / SCD / Other: Elevate legs to the level of the heart or above for 30 minutes daily and/or when sitting, a frequency of: Avoid standing for long periods of time. Laboratory ordered were: Biopsy specimen culture - Right Anterior Lower Leg WOUND #1: - Lower Leg Wound Laterality: Left, Anterior Cleanser: Soap and Water Every Other Day/15 Days Discharge Instructions: May shower and wash wound with dial antibacterial soap and water prior to dressing change. Cleanser: Wound Cleanser (DME) (Generic) Every Other Day/15 Days Discharge Instructions: Cleanse the wound with wound cleanser prior to applying a clean dressing using gauze sponges, not tissue or cotton  balls. Prim Dressing: Hydrofera Blue Classic Foam, 4x4 in (DME) (Generic) Every Other Day/15 Days ary Discharge Instructions: Moisten with saline prior to applying to wound bed Secondary Dressing: Woven Gauze Sponge, Non-Sterile 4x4 in (DME) (Generic) Every Other Day/15 Days Discharge Instructions: Apply over primary dressing as directed. Secondary Dressing: ABD Pad, 5x9 (DME) (Generic) Every Other Day/15 Days Discharge Instructions: Apply over primary dressing as directed. Secured With: [employer], 4.5x3.1 (in/yd) (DME) (Generic) Every Other Day/15 Days Discharge Instructions: Secure with Kerlix as directed. Secured With: 4M Medipore H Soft Cloth Surgical T ape, 4 x 10 (in/yd) (DME) (Generic) Every Other Day/15 Days Discharge Instructions: Secure with tape as directed. 1. Hydrofera Blue 2. Punch biopsy 3. Follow-up in 1 week Electronic Signature(s) Signed: 04/06/2021 3:30:02 PM By: Kalman Shan DO Entered By: Kalman Shan on 04/06/2021 15:29:32 -------------------------------------------------------------------------------- HxROS Details Patient Name: Date of Service: [name], [name] 04/06/2021 9:00 A M Medical Record Number: [id number] Patient Account Number: [id number] Date of Birth/Sex: Treating RN: 09-15-1945 (66 y.o. [name] Primary Care Provider: Elyn Peers Other Clinician: Referring Provider: Treating Provider/Extender: Arnette Schaumann Weeks in Treatment: 0 Information Obtained From Patient Chart Constitutional Symptoms (General Health) Complaints and Symptoms: Negative for: Fatigue; Fever; Chills; Marked Weight Change Eyes Complaints and Symptoms: Negative  for: Dry Eyes; Vision Changes; Glasses / Contacts Ear/Nose/Mouth/Throat Complaints and Symptoms: Negative for: Chronic sinus problems or rhinitis Respiratory Complaints and Symptoms: Negative for: Chronic or frequent coughs; Shortness of  Breath Cardiovascular Complaints and Symptoms: Negative for: Chest pain Medical History: Positive for: Hypertension Negative for: Angina; Arrhythmia; Congestive Heart Failure; Coronary Artery Disease; Deep Vein Thrombosis; Hypotension; Myocardial Infarction; Peripheral Arterial Disease; Peripheral Venous Disease; Phlebitis; Vasculitis Past Medical History Notes: hyperlipdemia Gastrointestinal Complaints and Symptoms: Negative for: Frequent diarrhea; Nausea; Vomiting Genitourinary Complaints and Symptoms: Negative for: Frequent urination Integumentary (Skin) Complaints and Symptoms: Positive for: Wounds Medical History: Negative for: History of Burn Musculoskeletal Complaints and Symptoms: Negative for: Muscle Pain; Muscle Weakness Neurologic Complaints and Symptoms: Negative for: Numbness/parasthesias Psychiatric Complaints and Symptoms: Negative for: Claustrophobia; Suicidal Hematologic/Lymphatic Endocrine Medical History: Past Medical History Notes: pre0diabetic Immunological Oncologic Medical History: Past Medical History Notes: hx of prostate ca Immunizations Pneumococcal Vaccine: Received Pneumococcal Vaccination: No Implantable Devices None Hospitalization / Surgery History Type of Hospitalization/Surgery back surgery/foot surgery/ radiation plant sreed Family and Social History Unknown History: Yes; Never smoker; Marital Status - Married; Alcohol Use: Rarely; Drug Use: No History; Caffeine Use: Rarely; Financial Concerns: No; Food, Clothing or Shelter Needs: No; Support System Lacking: No; Transportation Concerns: No Electronic Signature(s) Signed: 04/06/2021 12:25:45 PM By: Rhae Hammock RN Signed: 04/06/2021 3:30:02 PM By: Kalman Shan DO Entered By: Rhae Hammock on 04/06/2021 09:32:13 -------------------------------------------------------------------------------- SuperBill Details Patient Name: Date of Service: [name]  04/06/2021 Medical Record Number: [id number] Patient Account Number: [id number] Date of Birth/Sex: Treating RN: [birth year] (75 y.o. [name], [name] Primary Care Provider: Elyn Peers Other Clinician: Referring Provider: Treating Provider/Extender: Arnette Schaumann Weeks in Treatment: 0 Diagnosis Coding ICD-10 Codes Code Description [phone] Non-pressure chronic ulcer of other part of left lower leg with other specified severity I10 Essential (primary) hypertension E11.9 Type 2 diabetes mellitus without complications Facility Procedures CPT4 Code: [id number] Description: 83291 - WOUND CARE VISIT-LEV 3 EST PT Modifier: Quantity: 1 CPT4 Code: [id number] Description: 11104-Punch biopsy of skin (including simple closure, when performed) single lesion ICD-10 Diagnosis Description L97.828 Non-pressure chronic ulcer of other part of left lower leg with other specified severit Modifier: y Quantity: 1 Physician Procedures : CPT4 Code Description Modifier [id number] [place] - WC PHYS LEVEL 4 - NEW PT ICD-10 Diagnosis Description L97.828 Non-pressure chronic ulcer of other part of left lower leg with other specified severity I10 Essential (primary) hypertension E11.9 Type 2  diabetes mellitus without complications Quantity: 1 : 11104 Punch biopsy of skin (including simple closure, when performed) single lesion ICD-10 Diagnosis Description L97.828 Non-pressure chronic ulcer of other part of left lower leg with other specified severity Quantity: 1 Electronic Signature(s) Signed: 04/06/2021 3:30:02 PM By: Kalman Shan DO Previous Signature: 04/06/2021 12:25:45 PM Version By: Rhae Hammock RN Entered By: Kalman Shan on 04/06/2021 15:29:40

## 2021-04-10 DIAGNOSIS — H353131 Nonexudative age-related macular degeneration, bilateral, early dry stage: Secondary | ICD-10-CM | POA: Diagnosis not present

## 2021-04-10 DIAGNOSIS — Z961 Presence of intraocular lens: Secondary | ICD-10-CM | POA: Diagnosis not present

## 2021-04-10 DIAGNOSIS — Z79899 Other long term (current) drug therapy: Secondary | ICD-10-CM | POA: Diagnosis not present

## 2021-04-10 DIAGNOSIS — H33312 Horseshoe tear of retina without detachment, left eye: Secondary | ICD-10-CM | POA: Diagnosis not present

## 2021-04-10 DIAGNOSIS — H2511 Age-related nuclear cataract, right eye: Secondary | ICD-10-CM | POA: Diagnosis not present

## 2021-04-10 DIAGNOSIS — H209 Unspecified iridocyclitis: Secondary | ICD-10-CM | POA: Diagnosis not present

## 2021-04-10 DIAGNOSIS — H35033 Hypertensive retinopathy, bilateral: Secondary | ICD-10-CM | POA: Diagnosis not present

## 2021-04-12 ENCOUNTER — Encounter (HOSPITAL_BASED_OUTPATIENT_CLINIC_OR_DEPARTMENT_OTHER): Payer: Medicare PPO | Attending: Internal Medicine | Admitting: Internal Medicine

## 2021-04-12 ENCOUNTER — Other Ambulatory Visit: Payer: Self-pay

## 2021-04-12 DIAGNOSIS — E119 Type 2 diabetes mellitus without complications: Secondary | ICD-10-CM

## 2021-04-12 DIAGNOSIS — I10 Essential (primary) hypertension: Secondary | ICD-10-CM | POA: Diagnosis not present

## 2021-04-12 DIAGNOSIS — E11622 Type 2 diabetes mellitus with other skin ulcer: Secondary | ICD-10-CM | POA: Diagnosis not present

## 2021-04-12 DIAGNOSIS — L97828 Non-pressure chronic ulcer of other part of left lower leg with other specified severity: Secondary | ICD-10-CM | POA: Diagnosis not present

## 2021-04-16 ENCOUNTER — Other Ambulatory Visit (HOSPITAL_COMMUNITY): Payer: Self-pay | Admitting: Internal Medicine

## 2021-04-16 ENCOUNTER — Other Ambulatory Visit: Payer: Self-pay

## 2021-04-16 ENCOUNTER — Ambulatory Visit (HOSPITAL_COMMUNITY)
Admission: RE | Admit: 2021-04-16 | Discharge: 2021-04-16 | Disposition: A | Discharge status: Home / Self Care | Payer: Medicare PPO | Source: Ambulatory Visit | Attending: Internal Medicine | Admitting: Internal Medicine

## 2021-04-16 DIAGNOSIS — L97329 Non-pressure chronic ulcer of left ankle with unspecified severity: Secondary | ICD-10-CM

## 2021-04-17 NOTE — Progress Notes (Signed)
SHIA, EBER ([id number]) Visit Report for 04/12/2021 Arrival Information Details Patient Name: Date of Service: Larry Welch July 04/12/2021 8:45 A M Medical Record Number: [id number] Patient Account Number: [id number] Date of Birth/Sex: Treating RN: [birth year] (75 y.o. [name], [name] Primary Care Emilianna Barlowe: Elyn Peers Other Clinician: Referring Faydra Korman: Treating Jahziah Simonin/Extender: Arnette Schaumann Weeks in Treatment: 0 Visit Information History Since Last Visit Added or deleted any medications: No Patient Arrived: Ambulatory Any new allergies or adverse reactions: No Arrival Time: 09:04 Had a fall or experienced change in No Accompanied By: self activities of daily living that may affect Transfer Assistance: None risk of falls: Patient Identification Verified: Yes Signs or symptoms of abuse/neglect since last visito No Secondary Verification Process Completed: Yes Hospitalized since last visit: No Patient Requires Transmission-Based Precautions: No Implantable device outside of the clinic excluding No Patient Has Alerts: No cellular tissue based products placed in the center since last visit: Has Dressing in Place as Prescribed: Yes Pain Present Now: Yes Electronic Signature(s) Signed: 04/17/2021 4:45:49 PM By: Rhae Hammock RN Entered By: Rhae Hammock on 04/12/2021 09:05:07 -------------------------------------------------------------------------------- Clinic Level of Care Assessment Details Patient Name: Date of Service: Larry Sickles RLES E. 04/12/2021 8:45 A M Medical Record Number: [id number] Patient Account Number: [id number] Date of Birth/Sex: Treating RN: Sep 23, 1945 (75 y.o. [name], [name] Primary Care Jahmeek Shirk: Elyn Peers Other Clinician: Referring Bela Nyborg: Treating Ethan Clayburn/Extender: Arnette Schaumann Weeks in Treatment: 0 Clinic Level of Care Assessment Items TOOL 4 Quantity Score Welch- 1 0 Use when only  an EandM is performed on FOLLOW-UP visit ASSESSMENTS - Nursing Assessment / Reassessment Welch- 1 10 Reassessment of Co-morbidities (includes updates in patient status) Welch- 1 5 Reassessment of Adherence to Treatment Plan ASSESSMENTS - Wound and Skin A ssessment / Reassessment Welch - Simple Wound Assessment / Reassessment - one wound 1 5 []  - 0 Complex Wound Assessment / Reassessment - multiple wounds []  - 0 Dermatologic / Skin Assessment (not related to wound area) ASSESSMENTS - Focused Assessment []  - 0 Circumferential Edema Measurements - multi extremities []  - 0 Nutritional Assessment / Counseling / Intervention []  - 0 Lower Extremity Assessment (monofilament, tuning fork, pulses) []  - 0 Peripheral Arterial Disease Assessment (using hand held doppler) ASSESSMENTS - Ostomy and/or Continence Assessment and Care []  - 0 Incontinence Assessment and Management []  - 0 Ostomy Care Assessment and Management (repouching, etc.) PROCESS - Coordination of Care Welch - Simple Patient / Family Education for ongoing care 1 15 []  - 0 Complex (extensive) Patient / Family Education for ongoing care Welch- 1 10 Staff obtains Programmer, systems, Records, T Results / Process Orders est []  - 0 Staff telephones HHA, Nursing Homes / Clarify orders / etc []  - 0 Routine Transfer to another Facility (non-emergent condition) []  - 0 Routine Hospital Admission (non-emergent condition) []  - 0 New Admissions / Biomedical engineer / Ordering NPWT Apligraf, etc. , []  - 0 Emergency Hospital Admission (emergent condition) Welch- 1 10 Simple Discharge Coordination []  - 0 Complex (extensive) Discharge Coordination PROCESS - Special Needs []  - 0 Pediatric / Minor Patient Management []  - 0 Isolation Patient Management []  - 0 Hearing / Language / Visual special needs []  - 0 Assessment of Community assistance (transportation, D/C planning, etc.) []  - 0 Additional assistance / Altered mentation []  - 0 Support Surface(s)  Assessment (bed, cushion, seat, etc.) INTERVENTIONS - Wound Cleansing / Measurement Welch - Simple Wound Cleansing - one wound 1 5 []  -  0 Complex Wound Cleansing - multiple wounds Welch- 1 5 Wound Imaging (photographs - any number of wounds) []  - 0 Wound Tracing (instead of photographs) Welch- 1 5 Simple Wound Measurement - one wound []  - 0 Complex Wound Measurement - multiple wounds INTERVENTIONS - Wound Dressings []  - 0 Small Wound Dressing one or multiple wounds Welch- 1 15 Medium Wound Dressing one or multiple wounds []  - 0 Large Wound Dressing one or multiple wounds Welch- 1 5 Application of Medications - topical []  - 0 Application of Medications - injection INTERVENTIONS - Miscellaneous []  - 0 External ear exam []  - 0 Specimen Collection (cultures, biopsies, blood, body fluids, etc.) []  - 0 Specimen(s) / Culture(s) sent or taken to Lab for analysis []  - 0 Patient Transfer (multiple staff / Civil Service fast streamer / Similar devices) []  - 0 Simple Staple / Suture removal (25 or less) []  - 0 Complex Staple / Suture removal (26 or more) []  - 0 Hypo / Hyperglycemic Management (close monitor of Blood Glucose) []  - 0 Ankle / Brachial Index (ABI) - do not check if billed separately Welch- 1 5 Vital Signs Has the patient been seen at the hospital within the last three years: Yes Total Score: 95 Level Of Care: New/Established - Level 3 Electronic Signature(s) Signed: 04/17/2021 4:45:49 PM By: Rhae Hammock RN Entered By: Rhae Hammock on 04/12/2021 09:42:17 -------------------------------------------------------------------------------- Encounter Discharge Information Details Patient Name: Date of Service: Larry Welch, Larry RLES E. 04/12/2021 8:45 A M Medical Record Number: [id number] Patient Account Number: [id number] Date of Birth/Sex: Treating RN: 14-Jul-1945 (75 y.o. [name], [name] Primary Care Armiyah Capron: Elyn Peers Other Clinician: Referring Kayden Amend: Treating Casimir Barcellos/Extender: Jeannine Boga in Treatment: 0 Encounter Discharge Information Items Discharge Condition: Stable Ambulatory Status: Ambulatory Discharge Destination: Home Transportation: Private Auto Accompanied By: self Schedule Follow-up Appointment: Yes Clinical Summary of Care: Patient Declined Electronic Signature(s) Signed: 04/17/2021 4:45:49 PM By: Rhae Hammock RN Entered By: Rhae Hammock on 04/12/2021 09:43:05 -------------------------------------------------------------------------------- Lower Extremity Assessment Details Patient Name: Date of Service: Larry Sickles RLES E. 04/12/2021 8:45 A M Medical Record Number: [id number] Patient Account Number: [id number] Date of Birth/Sex: Treating RN: [birth year] (75 y.o. [name], [name] Primary Care Jatoya Armbrister: Elyn Peers Other Clinician: Referring Persephanie Laatsch: Treating Giovannie Scerbo/Extender: Arnette Schaumann Weeks in Treatment: 0 Edema Assessment Assessed: [Left: Yes] [Right: No] Edema: [Left: Ye] [Right: s] Calf Left: Right: Point of Measurement: 8 cm From Medial Instep 39 cm Ankle Left: Right: Point of Measurement: 41 cm From Medial Instep 8 cm Vascular Assessment Pulses: Dorsalis Pedis Palpable: [Left:Yes] Posterior Tibial Palpable: [Left:Yes] Electronic Signature(s) Signed: 04/17/2021 4:45:49 PM By: Rhae Hammock RN Entered By: Rhae Hammock on 04/12/2021 09:06:43 -------------------------------------------------------------------------------- Multi Wound Chart Details Patient Name: Date of Service: Larry Sickles RLES E. 04/12/2021 8:45 A M Medical Record Number: [id number] Patient Account Number: [id number] Date of Birth/Sex: Treating RN: 1946-05-25 (75 y.o. [name], [name] Primary Care Josey Forcier: Elyn Peers Other Clinician: Referring Masey Scheiber: Treating Maezie Justin/Extender: Arnette Schaumann Weeks in Treatment: 0 Vital Signs Height(in): 72 Pulse(bpm):  74 Weight(lbs): 195 Blood Pressure(mmHg): 121/74 Body Mass Index(BMI): 26 Temperature(F): 97.4 Respiratory Rate(breaths/min): 17 Photos: [N/A:N/A] Left, Anterior Lower Leg N/A N/A Wound Location: Trauma N/A N/A Wounding Event: Trauma, Other N/A N/A Primary Etiology: Hypertension N/A N/A Comorbid History: 02/08/2021 N/A N/A Date Acquired: 0 N/A N/A Weeks of Treatment: Open N/A N/A Wound Status: Yes N/A N/A Clustered Wound: 3.5x3x0.7 N/A N/A Measurements L Welch W  Welch D (cm) 8.247 N/A N/A A (cm) : rea 5.773 N/A N/A Volume (cm) : 20.40% N/A N/A % Reduction in A rea: 53.60% N/A N/A % Reduction in Volume: Full Thickness Without Exposed N/A N/A Classification: Support Structures Medium N/A N/A Exudate Amount: Purulent N/A N/A Exudate Type: yellow, brown, green N/A N/A Exudate Color: Distinct, outline attached N/A N/A Wound Margin: Medium (34-66%) N/A N/A Granulation Amount: Red, Pink N/A N/A Granulation Quality: Medium (34-66%) N/A N/A Necrotic Amount: Fat Layer (Subcutaneous Tissue): Yes N/A N/A Exposed Structures: Fascia: No Tendon: No Muscle: No Joint: No Bone: No Treatment Notes Wound #1 (Lower Leg) Wound Laterality: Left, Anterior Cleanser Soap and Water Discharge Instruction: May shower and wash wound with dial antibacterial soap and water prior to dressing change. Wound Cleanser Discharge Instruction: Cleanse the wound with wound cleanser prior to applying a clean dressing using gauze sponges, not tissue or cotton balls. Peri-Wound Care Topical Primary Dressing Hydrofera Blue Classic Foam, 4x4 in Discharge Instruction: Moisten with saline prior to applying to wound bed Secondary Dressing Woven Gauze Sponge, Non-Sterile 4x4 in Discharge Instruction: Apply over primary dressing as directed. ABD Pad, 5x9 Discharge Instruction: Apply over primary dressing as directed. Secured With 88M Medipore H Soft Cloth Surgical T ape, 4 Welch 10 (in/yd) Discharge  Instruction: Secure with tape as directed. Compression Wrap Kerlix Roll 4.5x3.1 (in/yd) Discharge Instruction: Apply Kerlix and Coban compression as directed. Coban Self-Adherent Wrap 4x5 (in/yd) Discharge Instruction: Apply over Kerlix as directed. Compression Stockings Add-Ons Electronic Signature(s) Signed: 04/12/2021 11:27:53 AM By: Kalman Shan DO Signed: 04/17/2021 4:45:49 PM By: Rhae Hammock RN Entered By: Kalman Shan on 04/12/2021 11:18:17 -------------------------------------------------------------------------------- Multi-Disciplinary Care Plan Details Patient Name: Date of Service: Larry Sickles RLES E. 04/12/2021 8:45 A M Medical Record Number: [id number] Patient Account Number: [id number] Date of Birth/Sex: Treating RN: [birth year] (75 y.o. [name], [name] Primary Care Maico Mulvehill: Elyn Peers Other Clinician: Referring Latoiya Maradiaga: Treating Moroni Nester/Extender: Arnette Schaumann Weeks in Treatment: 0 Active Inactive Wound/Skin Impairment Nursing Diagnoses: Impaired tissue integrity Knowledge deficit related to ulceration/compromised skin integrity Goals: Patient will have a decrease in wound volume by Welch% from date: (specify in notes) Date Initiated: 04/06/2021 Target Resolution Date: 04/12/2021 Goal Status: Active Patient/caregiver will verbalize understanding of skin care regimen Date Initiated: 04/06/2021 Target Resolution Date: 06/13/2021 Goal Status: Active Ulcer/skin breakdown will have a volume reduction of 30% by week 4 Date Initiated: 04/06/2021 Target Resolution Date: 04/13/2021 Goal Status: Active Ulcer/skin breakdown will have a volume reduction of 50% by week 8 Date Initiated: 04/06/2021 Target Resolution Date: 04/13/2021 Goal Status: Active Interventions: Assess patient/caregiver ability to obtain necessary supplies Assess patient/caregiver ability to perform ulcer/skin care regimen upon admission and as needed Assess  ulceration(s) every visit Notes: Electronic Signature(s) Signed: 04/17/2021 4:45:49 PM By: Rhae Hammock RN Entered By: Rhae Hammock on 04/12/2021 09:17:11 -------------------------------------------------------------------------------- Pain Assessment Details Patient Name: Date of Service: Larry Welch, Larry Welch 04/12/2021 8:45 A M Medical Record Number: [id number] Patient Account Number: [id number] Date of Birth/Sex: Treating RN: May 21, 1946 (75 y.o. [name], [name] Primary Care Aya Geisel: Elyn Peers Other Clinician: Referring Copper Basnett: Treating Emmanual Gauthreaux/Extender: Arnette Schaumann Weeks in Treatment: 0 Active Problems Location of Pain Severity and Description of Pain Patient Has Paino Yes Site Locations Pain Location: Generalized Pain, Pain in Ulcers With Dressing Change: Yes Duration of the Pain. Constant / Intermittento Intermittent Rate the pain. Current Pain Level: 7 Worst Pain Level: 10 Least Pain Level: 0 Tolerable Pain Level:  7 Character of Pain Describe the Pain: Aching Pain Management and Medication Current Pain Management: Medication: No Cold Application: No Rest: No Massage: No Activity: No T.WelchN.S.: No Heat Application: No Leg drop or elevation: No Is the Current Pain Management Adequate: Adequate How does your wound impact your activities of daily livingo Sleep: No Bathing: No Appetite: No Relationship With Others: No Bladder Continence: No Emotions: No Bowel Continence: No Work: No Toileting: No Drive: No Dressing: No Hobbies: No Electronic Signature(s) Signed: 04/17/2021 4:45:49 PM By: Rhae Hammock RN Entered By: Rhae Hammock on 04/12/2021 09:06:33 -------------------------------------------------------------------------------- Patient/Caregiver Education Details Patient Name: Date of Service: Larry Welch July 11/3/2022andnbsp8:45 A M Medical Record Number: [id number] Patient Account Number:  [id number] Date of Birth/Gender: Treating RN: 1946-06-06 (79 y.o. [name] Primary Care Physician: Elyn Peers Other Clinician: Referring Physician: Treating Physician/Extender: Jeannine Boga in Treatment: 0 Education Assessment Education Provided To: Patient Education Topics Provided Wound/Skin Impairment: Methods: Explain/Verbal Responses: Reinforcements needed Electronic Signature(s) Signed: 04/17/2021 4:45:49 PM By: Rhae Hammock RN Entered By: Rhae Hammock on 04/12/2021 09:17:33 -------------------------------------------------------------------------------- Wound Assessment Details Patient Name: Date of Service: Larry Sickles RLES E. 04/12/2021 8:45 A M Medical Record Number: [id number] Patient Account Number: [id number] Date of Birth/Sex: Treating RN: 1945/09/03 (75 y.o. [name], [name] Primary Care Mackenna Kamer: Elyn Peers Other Clinician: Referring Toan Mort: Treating Neema Barreira/Extender: Arnette Schaumann Weeks in Treatment: 0 Wound Status Wound Number: 1 Primary Etiology: Trauma, Other Wound Location: Left, Anterior Lower Leg Wound Status: Open Wounding Event: Trauma Comorbid History: Hypertension Date Acquired: 02/08/2021 Weeks Of Treatment: 0 Clustered Wound: Yes Photos Wound Measurements Length: (cm) 3.5 Width: (cm) 3 Depth: (cm) 0.7 Area: (cm) 8.247 Volume: (cm) 5.773 % Reduction in Area: 20.4% % Reduction in Volume: 53.6% Tunneling: No Undermining: No Wound Description Classification: Full Thickness Without Exposed Support Structures Wound Margin: Distinct, outline attached Exudate Amount: Medium Exudate Type: Purulent Exudate Color: yellow, brown, green Foul Odor After Cleansing: No Slough/Fibrino Yes Wound Bed Granulation Amount: Medium (34-66%) Exposed Structure Granulation Quality: Red, Pink Fascia Exposed: No Necrotic Amount: Medium (34-66%) Fat Layer (Subcutaneous Tissue)  Exposed: Yes Necrotic Quality: Adherent Slough Tendon Exposed: No Muscle Exposed: No Joint Exposed: No Bone Exposed: No Treatment Notes Wound #1 (Lower Leg) Wound Laterality: Left, Anterior Cleanser Soap and Water Discharge Instruction: May shower and wash wound with dial antibacterial soap and water prior to dressing change. Wound Cleanser Discharge Instruction: Cleanse the wound with wound cleanser prior to applying a clean dressing using gauze sponges, not tissue or cotton balls. Peri-Wound Care Topical Primary Dressing Hydrofera Blue Classic Foam, 4x4 in Discharge Instruction: Moisten with saline prior to applying to wound bed Secondary Dressing Woven Gauze Sponge, Non-Sterile 4x4 in Discharge Instruction: Apply over primary dressing as directed. ABD Pad, 5x9 Discharge Instruction: Apply over primary dressing as directed. Secured With 78M Medipore H Soft Cloth Surgical T ape, 4 Welch 10 (in/yd) Discharge Instruction: Secure with tape as directed. Compression Wrap Kerlix Roll 4.5x3.1 (in/yd) Discharge Instruction: Apply Kerlix and Coban compression as directed. Coban Self-Adherent Wrap 4x5 (in/yd) Discharge Instruction: Apply over Kerlix as directed. Compression Stockings Add-Ons Electronic Signature(s) Signed: 04/17/2021 4:45:49 PM By: Rhae Hammock RN Entered By: Rhae Hammock on 04/12/2021 09:13:36 -------------------------------------------------------------------------------- Vitals Details Patient Name: Date of Service: Larry Welch, Larry RLES E. 04/12/2021 8:45 A M Medical Record Number: [id number] Patient Account Number: [id number] Date of Birth/Sex: Treating RN: 23-May-1946 (75 y.o. [name], [name] Primary Care Avedis Bevis: Lucianne Lei  J Other Clinician: Referring Manpreet Strey: Treating Trayon Krantz/Extender: Arnette Schaumann Weeks in Treatment: 0 Vital Signs Time Taken: 09:05 Temperature (F): 97.4 Height (in): 72 Pulse (bpm): 74 Weight (lbs):  195 Respiratory Rate (breaths/min): 17 Body Mass Index (BMI): 26.4 Blood Pressure (mmHg): 121/74 Reference Range: 80 - 120 mg / dl Electronic Signature(s) Signed: 04/17/2021 4:45:49 PM By: Rhae Hammock RN Entered By: Rhae Hammock on 04/12/2021 09:05:48

## 2021-04-17 NOTE — Progress Notes (Signed)
JOAS, MOTTON ([id number]) Visit Report for 04/12/2021 Chief Complaint Document Details Patient Name: Date of Service: Larry Welch Larry E. 04/12/2021 8:45 A M Medical Record Number: [id number] Patient Account Number: [id number] Date of Birth/Sex: Treating RN: 1946/05/12 (75 y.o. Larry Welch Primary Care Provider: Elyn Welch Other Clinician: Referring Provider: Treating Provider/Extender: Larry Welch: 0 Information Obtained from: Patient Chief Complaint Left lower extremity wound Electronic Signature(s) Signed: 04/12/2021 11:27:53 AM By: Larry Welch Entered By: Larry Welch on 04/12/2021 11:18:32 -------------------------------------------------------------------------------- HPI Details Patient Name: Date of Service: Larry Welch Larry E. 04/12/2021 8:45 A M Medical Record Number: [id number] Patient Account Number: [id number] Date of Birth/Sex: Treating RN: Apr 30, 1946 (75 y.o. Larry Welch Primary Care Provider: Elyn Welch Other Clinician: Referring Provider: Treating Provider/Extender: Larry Welch: 0 History of Present Illness HPI Description: Admission 04/06/2021 Larry Welch is a 75 year old male with a past medical history of type 2 diabetes on oral medications, essential hypertension and OSA who presents to the clinic for a 26-month history of nonhealing wound to his left lower extremity. He states that he developed a wound from a pressure washer in the beginning of September. He states that the wound had improved to the point that it almost closed and then became significantly worse in the past couple weeks. He visited the ED on 04/04/2021 for this issue and was started on doxycycline. He currently denies signs of infection. 11/3; patient presents for follow-up. He has been using Hydrofera Blue without issues. He has no issues or complaints today. He denies signs of  infection. He does not have pain to the wound site. Electronic Signature(s) Signed: 04/12/2021 11:27:53 AM By: Larry Welch Entered By: Larry Welch on 04/12/2021 11:19:01 -------------------------------------------------------------------------------- Physical Exam Details Patient Name: Date of Service: Larry Welch Larry E. 04/12/2021 8:45 A M Medical Record Number: [id number] Patient Account Number: [id number] Date of Birth/Sex: Treating RN: 1945-08-08 (75 y.o. Larry Welch Primary Care Provider: Other Clinician: Elyn Welch Referring Provider: Treating Provider/Extender: Larry Welch: 0 Constitutional respirations regular, non-labored and within target range for patient.. Cardiovascular 2+ dorsalis pedis/posterior tibialis pulses. Psychiatric pleasant and cooperative. Notes 1 large open wound with pale granulation tissue present. No obvious signs of infection. 2+ pitting edema to the shin Electronic Signature(s) Signed: 04/12/2021 11:27:53 AM By: Larry Welch Entered By: Larry Welch on 04/12/2021 11:23:25 -------------------------------------------------------------------------------- Physician Orders Details Patient Name: Date of Service: Larry Welch Larry E. 04/12/2021 8:45 A M Medical Record Number: [id number] Patient Account Number: [id number] Date of Birth/Sex: Treating RN: May 30, 1946 (75 y.o. Larry Welch Primary Care Provider: Elyn Welch Other Clinician: Referring Provider: Treating Provider/Extender: Larry Welch: 0 Verbal / Phone Orders: No Diagnosis Coding ICD-10 Coding Code Description [phone] Non-pressure chronic ulcer of other part of left lower leg with other specified severity I10 Essential (primary) hypertension E11.9 Type 2 diabetes mellitus without complications U93.235 Secondary infectious iridocyclitis, left eye Follow-up  Appointments ppointment in 1 week. - Larry Welch Return A Bathing/ Shower/ Hygiene May shower with protection but Welch not get wound dressing(s) wet. Edema Control - Lymphedema / SCD / Other Elevate legs to the level of the heart or above for 30 minutes daily and/or when sitting, a frequency of: Avoid standing for long periods of time. Wound Welch Wound #1 - Lower Leg Wound Laterality: Left, Anterior Cleanser:  Soap and Water Every Other Day/15 Days Discharge Instructions: May shower and wash wound with dial antibacterial soap and water prior to dressing change. Cleanser: Wound Cleanser (Generic) Every Other Day/15 Days Discharge Instructions: Cleanse the wound with wound cleanser prior to applying a clean dressing using gauze sponges, not tissue or cotton balls. Prim Dressing: Hydrofera Blue Classic Foam, 4x4 in (Generic) Every Other Day/15 Days ary Discharge Instructions: Moisten with saline prior to applying to wound bed Secondary Dressing: Woven Gauze Sponge, Non-Sterile 4x4 in (Generic) Every Other Day/15 Days Discharge Instructions: Apply over primary dressing as directed. Secondary Dressing: ABD Pad, 5x9 (Generic) Every Other Day/15 Days Discharge Instructions: Apply over primary dressing as directed. Secured With: 53M Medipore H Soft Cloth Surgical T ape, 4 x 10 (in/yd) (Generic) Every Other Day/15 Days Discharge Instructions: Secure with tape as directed. Compression Wrap: Kerlix Roll 4.5x3.1 (in/yd) Every Other Day/15 Days Discharge Instructions: Apply Kerlix and Coban compression as directed. Compression Wrap: Coban Self-Adherent Wrap 4x5 (in/yd) Every Other Day/15 Days Discharge Instructions: Apply over Kerlix as directed. Electronic Signature(s) Signed: 04/12/2021 11:27:53 AM By: Larry Welch Entered By: Larry Welch on 04/12/2021 11:23:47 -------------------------------------------------------------------------------- Problem List Details Patient Name: Date of  Service: Larry Welch Larry E. 04/12/2021 8:45 A M Medical Record Number: [id number] Patient Account Number: [id number] Date of Birth/Sex: Treating RN: September 24, 1945 (75 y.o. Larry Welch Primary Care Provider: Elyn Welch Other Clinician: Referring Provider: Treating Provider/Extender: Larry Welch: 0 Active Problems ICD-10 Encounter Code Description Active Date MDM Diagnosis [phone] Non-pressure chronic ulcer of other part of left lower leg with other specified 04/06/2021 No Yes severity I10 Essential (primary) hypertension 04/06/2021 No Yes E11.9 Type 2 diabetes mellitus without complications 51/70/0174 No Yes H20.032 Secondary infectious iridocyclitis, left eye 04/12/2021 No Yes Inactive Problems Resolved Problems Electronic Signature(s) Signed: 04/12/2021 11:27:53 AM By: Larry Welch Entered By: Larry Welch on 04/12/2021 11:18:04 -------------------------------------------------------------------------------- Progress Note Details Patient Name: Date of Service: Larry Welch Larry E. 04/12/2021 8:45 A M Medical Record Number: [id number] Patient Account Number: [id number] Date of Birth/Sex: Treating RN: Apr 25, 1946 (75 y.o. Larry Welch Primary Care Provider: Elyn Welch Other Clinician: Referring Provider: Treating Provider/Extender: Larry Welch: 0 Subjective Chief Complaint Information obtained from Patient Left lower extremity wound History of Present Illness (HPI) Admission 04/06/2021 Mr. Doyne Micke is a 75 year old male with a past medical history of type 2 diabetes on oral medications, essential hypertension and OSA who presents to the clinic for a 86-month history of nonhealing wound to his left lower extremity. He states that he developed a wound from a pressure washer in the beginning of September. He states that the wound had improved to the point that it almost  closed and then became significantly worse in the past couple weeks. He visited the ED on 04/04/2021 for this issue and was started on doxycycline. He currently denies signs of infection. 11/3; patient presents for follow-up. He has been using Hydrofera Blue without issues. He has no issues or complaints today. He denies signs of infection. He does not have pain to the wound site. Patient History Information obtained from Patient, Chart. Family History Unknown History. Social History Never smoker, Marital Status - Married, Alcohol Use - Rarely, Drug Use - No History, Caffeine Use - Rarely. Medical History Cardiovascular Patient has history of Hypertension Denies history of Angina, Arrhythmia, Congestive Heart Failure, Coronary Artery Disease, Deep Vein Thrombosis, Hypotension, Myocardial Infarction,  Peripheral Arterial Disease, Peripheral Venous Disease, Phlebitis, Vasculitis Integumentary (Skin) Denies history of History of Burn Hospitalization/Surgery History - back surgery/foot surgery/ radiation plant sreed. Medical A Surgical History Notes nd Cardiovascular hyperlipdemia Endocrine pre0diabetic Oncologic hx of prostate ca Objective Constitutional respirations regular, non-labored and within target range for patient.. Vitals Time Taken: 9:05 AM, Height: 72 in, Weight: 195 lbs, BMI: 26.4, Temperature: 97.4 F, Pulse: 74 bpm, Respiratory Rate: 17 breaths/min, Blood Pressure: 121/74 mmHg. Cardiovascular 2+ dorsalis pedis/posterior tibialis pulses. Psychiatric pleasant and cooperative. General Notes: 1 large open wound with pale granulation tissue present. No obvious signs of infection. 2+ pitting edema to the shin Integumentary (Hair, Skin) Wound #1 status is Open. Original cause of wound was Trauma. The date acquired was: 02/08/2021. The wound is located on the Left,Anterior Lower Leg. The wound measures 3.5cm length x 3cm width x 0.7cm depth; 8.247cm^2 area and 5.773cm^3  volume. There is Fat Layer (Subcutaneous Tissue) exposed. There is no tunneling or undermining noted. There is a medium amount of purulent drainage noted. The wound margin is distinct with the outline attached to the wound base. There is medium (34-66%) red, pink granulation within the wound bed. There is a medium (34-66%) amount of necrotic tissue within the wound bed including Adherent Slough. Assessment Active Problems ICD-10 Non-pressure chronic ulcer of other part of left lower leg with other specified severity Essential (primary) hypertension Type 2 diabetes mellitus without complications Secondary infectious iridocyclitis, left eye Patient's wound is stable. No signs of infection on exam. A biopsy was done at last clinic visit and results showed inflamed granulation tissue with no specific changes indicative of a specific pathologic process. At this time I recommended Hydrofera Blue under compression therapy. ABIs with TBI's were ordered at last clinic visit however these are not scheduled. At this time we will start with Kerlix/Coban. This still has an atypical appearance to me however we will Welch conservative wound care for the next 4 to 6 weeks. If there is no improvement he may need a referral to Derm versus rheumatology for further work-up. He does have a history of uveitis and is currently on methotrexate. Plan Follow-up Appointments: Return Appointment in 1 week. - Dr. Heber [place] Bathing/ Shower/ Hygiene: May shower with protection but Welch not get wound dressing(s) wet. Edema Control - Lymphedema / SCD / Other: Elevate legs to the level of the heart or above for 30 minutes daily and/or when sitting, a frequency of: Avoid standing for long periods of time. WOUND #1: - Lower Leg Wound Laterality: Left, Anterior Cleanser: Soap and Water Every Other Day/15 Days Discharge Instructions: May shower and wash wound with dial antibacterial soap and water prior to dressing change. Cleanser:  Wound Cleanser (Generic) Every Other Day/15 Days Discharge Instructions: Cleanse the wound with wound cleanser prior to applying a clean dressing using gauze sponges, not tissue or cotton balls. Prim Dressing: Hydrofera Blue Classic Foam, 4x4 in (Generic) Every Other Day/15 Days ary Discharge Instructions: Moisten with saline prior to applying to wound bed Secondary Dressing: Woven Gauze Sponge, Non-Sterile 4x4 in (Generic) Every Other Day/15 Days Discharge Instructions: Apply over primary dressing as directed. Secondary Dressing: ABD Pad, 5x9 (Generic) Every Other Day/15 Days Discharge Instructions: Apply over primary dressing as directed. Secured With: 41M Medipore H Soft Cloth Surgical T ape, 4 x 10 (in/yd) (Generic) Every Other Day/15 Days Discharge Instructions: Secure with tape as directed. Com pression Wrap: Kerlix Roll 4.5x3.1 (in/yd) Every Other Day/15 Days Discharge Instructions: Apply Kerlix and Coban compression  as directed. Com pression Wrap: Coban Self-Adherent Wrap 4x5 (in/yd) Every Other Day/15 Days Discharge Instructions: Apply over Kerlix as directed. 1. Hydrofera Blue under Kerlix/Coban 2. Follow-up in 1 week Electronic Signature(s) Signed: 04/12/2021 11:27:53 AM By: Larry Welch Entered By: Larry Welch on 04/12/2021 11:26:41 -------------------------------------------------------------------------------- HxROS Details Patient Name: Date of Service: Larry Welch Larry E. 04/12/2021 8:45 A M Medical Record Number: [id number] Patient Account Number: [id number] Date of Birth/Sex: Treating RN: 1945-09-14 (75 y.o. Larry Welch Primary Care Provider: Elyn Welch Other Clinician: Referring Provider: Treating Provider/Extender: Larry Welch: 0 Information Obtained From Patient Chart Cardiovascular Medical History: Positive for: Hypertension Negative for: Angina; Arrhythmia; Congestive Heart Failure; Coronary Artery  Disease; Deep Vein Thrombosis; Hypotension; Myocardial Infarction; Peripheral Arterial Disease; Peripheral Venous Disease; Phlebitis; Vasculitis Past Medical History Notes: hyperlipdemia Endocrine Medical History: Past Medical History Notes: pre0diabetic Integumentary (Skin) Medical History: Negative for: History of Burn Oncologic Medical History: Past Medical History Notes: hx of prostate ca Immunizations Pneumococcal Vaccine: Received Pneumococcal Vaccination: No Implantable Devices None Hospitalization / Surgery History Type of Hospitalization/Surgery back surgery/foot surgery/ radiation plant sreed Family and Social History Unknown History: Yes; Never smoker; Marital Status - Married; Alcohol Use: Rarely; Drug Use: No History; Caffeine Use: Rarely; Financial Concerns: No; Food, Clothing or Shelter Needs: No; Support System Lacking: No; Transportation Concerns: No Electronic Signature(s) Signed: 04/12/2021 11:27:53 AM By: Larry Welch Signed: 04/17/2021 4:45:49 PM By: Rhae Hammock RN Entered By: Larry Welch on 04/12/2021 11:22:21 -------------------------------------------------------------------------------- SuperBill Details Patient Name: Date of Service: Larry Welch Larry E. 04/12/2021 Medical Record Number: [id number] Patient Account Number: [id number] Date of Birth/Sex: Treating RN: 06/21/1945 (75 y.o. Larry Welch Primary Care Provider: Elyn Welch Other Clinician: Referring Provider: Treating Provider/Extender: Larry Welch: 0 Diagnosis Coding ICD-10 Codes Code Description [phone] Non-pressure chronic ulcer of other part of left lower leg with other specified severity I10 Essential (primary) hypertension E11.9 Type 2 diabetes mellitus without complications Facility Procedures CPT4 Code: [id number] Description: 99213 - WOUND CARE VISIT-LEV 3 EST PT Modifier: Quantity: 1 Physician Procedures :  CPT4 Code Description Modifier [id number] - WC PHYS LEVEL 3 - EST PT ICD-10 Diagnosis Description L97.828 Non-pressure chronic ulcer of other part of left lower leg with other specified severity I10 Essential (primary) hypertension E11.9 Type 2  diabetes mellitus without complications Quantity: 1 Electronic Signature(s) Signed: 04/12/2021 11:27:53 AM By: Larry Welch Entered By: Larry Welch on 04/12/2021 11:26:56

## 2021-04-19 ENCOUNTER — Encounter (HOSPITAL_BASED_OUTPATIENT_CLINIC_OR_DEPARTMENT_OTHER): Payer: Medicare PPO | Admitting: Internal Medicine

## 2021-04-19 ENCOUNTER — Other Ambulatory Visit: Payer: Self-pay

## 2021-04-19 DIAGNOSIS — H20032 Secondary infectious iridocyclitis, left eye: Secondary | ICD-10-CM | POA: Diagnosis not present

## 2021-04-19 DIAGNOSIS — E11622 Type 2 diabetes mellitus with other skin ulcer: Secondary | ICD-10-CM

## 2021-04-19 DIAGNOSIS — I10 Essential (primary) hypertension: Secondary | ICD-10-CM

## 2021-04-19 DIAGNOSIS — L97828 Non-pressure chronic ulcer of other part of left lower leg with other specified severity: Secondary | ICD-10-CM

## 2021-04-20 NOTE — Progress Notes (Signed)
SHANE, BADEAUX ([id number]) Visit Report for 04/19/2021 Chief Complaint Document Details Patient Name: Date of Service: Larry Sickles RLES E. 04/19/2021 8:45 A M Medical Record Number: [id number] Patient Account Number: [id number] Date of Birth/Sex: Treating RN: May 12, 1946 (75 y.o. Larry Welch Primary Care Provider: Elyn Peers Other Clinician: Referring Provider: Treating Provider/Extender: Arnette Schaumann Weeks in Treatment: 1 Information Obtained from: Patient Chief Complaint Left lower extremity wound Electronic Signature(s) Signed: 04/19/2021 12:15:31 PM By: Kalman Shan DO Entered By: Kalman Shan on 04/19/2021 11:57:08 -------------------------------------------------------------------------------- HPI Details Patient Name: Date of Service: Larry Sickles RLES E. 04/19/2021 8:45 A M Medical Record Number: [id number] Patient Account Number: [id number] Date of Birth/Sex: Treating RN: 1945/12/20 (76 y.o. Larry Welch Primary Care Provider: Elyn Peers Other Clinician: Referring Provider: Treating Provider/Extender: Arnette Schaumann Weeks in Treatment: 1 History of Present Illness HPI Description: Admission 04/06/2021 Larry Welch is a 75 year old male with a past medical history of type 2 diabetes on oral medications, essential hypertension and OSA who presents to the clinic for a 32-month history of nonhealing wound to his left lower extremity. He states that he developed a wound from a pressure washer in the beginning of September. He states that the wound had improved to the point that it almost closed and then became significantly worse in the past couple weeks. He visited the ED on 04/04/2021 for this issue and was started on doxycycline. He currently denies signs of infection. 11/3; patient presents for follow-up. He has been using Hydrofera Blue without issues. He has no issues or complaints today. He denies signs of  infection. He does not have pain to the wound site. 11/10; patient presents for follow-up. He has no issues or complaints today. He tolerated the compression wrap well. He denies signs of infection. Electronic Signature(s) Signed: 04/19/2021 12:15:31 PM By: Kalman Shan DO Entered By: Kalman Shan on 04/19/2021 11:57:40 -------------------------------------------------------------------------------- Chemical Cauterization Details Patient Name: Date of Service: Larry Sickles RLES E. 04/19/2021 8:45 A M Medical Record Number: [id number] Patient Account Number: [id number] Date of Birth/Sex: Treating RN: 1945-07-31 (3 y.o. Larry Welch Primary Care Provider: Elyn Peers Other Clinician: Referring Provider: Treating Provider/Extender: Arnette Schaumann Weeks in Treatment: 1 Procedure Performed for: Wound #1 Left,Anterior Lower Leg Performed By: Physician Kalman Shan, DO Post Procedure Diagnosis Same as Pre-procedure Notes silver nitrate Electronic Signature(s) Signed: 04/19/2021 12:15:31 PM By: Kalman Shan DO Signed: 04/19/2021 5:34:00 PM By: Levan Hurst RN, BSN Entered By: Levan Hurst on 04/19/2021 09:47:32 -------------------------------------------------------------------------------- Physical Exam Details Patient Name: Date of Service: Larry Sickles RLES E. 04/19/2021 8:45 A M Medical Record Number: [id number] Patient Account Number: [id number] Date of Birth/Sex: Treating RN: 08-06-1945 (23 y.o. Larry Welch Primary Care Provider: Elyn Peers Other Clinician: Referring Provider: Treating Provider/Extender: Arnette Schaumann Weeks in Treatment: 1 Constitutional respirations regular, non-labored and within target range for patient.. Cardiovascular 2+ dorsalis pedis/posterior tibialis pulses. Psychiatric pleasant and cooperative. Notes 1 large open wound with pale Hyper granulated tissue present. No obvious signs  of infection. 2+ pitting edema to the shin. Electronic Signature(s) Signed: 04/19/2021 12:15:31 PM By: Kalman Shan DO Entered By: Kalman Shan on 04/19/2021 11:58:35 -------------------------------------------------------------------------------- Physician Orders Details Patient Name: Date of Service: Larry Welch, Larry RLES E. 04/19/2021 8:45 A M Medical Record Number: [id number] Patient Account Number: [id number] Date of Birth/Sex: Treating RN: 1946/05/16 (75 y.o. Larry Welch Primary Care Provider: Lucianne Lei  J Other Clinician: Referring Provider: Treating Provider/Extender: Arnette Schaumann Weeks in Treatment: 1 Verbal / Phone Orders: No Diagnosis Coding ICD-10 Coding Code Description [phone] Non-pressure chronic ulcer of other part of left lower leg with other specified severity E11.622 Type 2 diabetes mellitus with other skin ulcer I10 Essential (primary) hypertension H20.032 Secondary infectious iridocyclitis, left eye Follow-up Appointments ppointment in 1 week. - Dr. Heber [place] [address] Return A Bathing/ Shower/ Hygiene May shower with protection but do not get wound dressing(s) wet. Edema Control - Lymphedema / SCD / Other Elevate legs to the level of the heart or above for 30 minutes daily and/or when sitting, a frequency of: - throughout the day Avoid standing for long periods of time. Wound Treatment Wound #1 - Lower Leg Wound Laterality: Left, Anterior Cleanser: Soap and Water Every Other Day/15 Days Discharge Instructions: May shower and wash wound with dial antibacterial soap and water prior to dressing change. Cleanser: Wound Cleanser (Generic) Every Other Day/15 Days Discharge Instructions: Cleanse the wound with wound cleanser prior to applying a clean dressing using gauze sponges, not tissue or cotton balls. Peri-Wound Care: Sween Lotion (Moisturizing lotion) Every Other Day/15 Days Discharge Instructions: Apply moisturizing lotion as directed Prim  Dressing: Hydrofera Blue Classic Foam, 4x4 in (Generic) Every Other Day/15 Days ary Discharge Instructions: Moisten with saline prior to applying to wound bed Secondary Dressing: Woven Gauze Sponge, Non-Sterile 4x4 in (Generic) Every Other Day/15 Days Discharge Instructions: Apply over primary dressing as directed. Secondary Dressing: ABD Pad, 5x9 (Generic) Every Other Day/15 Days Discharge Instructions: Apply over primary dressing as directed. Secured With: 80M Medipore H Soft Cloth Surgical T ape, 4 x 10 (in/yd) (Generic) Every Other Day/15 Days Discharge Instructions: Secure with tape as directed. Compression Wrap: Kerlix Roll 4.5x3.1 (in/yd) Every Other Day/15 Days Discharge Instructions: Apply Kerlix and Coban compression as directed. Compression Wrap: Coban Self-Adherent Wrap 4x5 (in/yd) Every Other Day/15 Days Discharge Instructions: Apply over Kerlix as directed. Electronic Signature(s) Signed: 04/19/2021 12:15:31 PM By: Kalman Shan DO Entered By: Kalman Shan on 04/19/2021 11:58:51 -------------------------------------------------------------------------------- Problem List Details Patient Name: Date of Service: Larry Sickles RLES E. 04/19/2021 8:45 A M Medical Record Number: [id number] Patient Account Number: [id number] Date of Birth/Sex: Treating RN: Oct 06, 1945 (75 y.o. [name] Primary Care Provider: Elyn Peers Other Clinician: Referring Provider: Treating Provider/Extender: Arnette Schaumann Weeks in Treatment: 1 Active Problems ICD-10 Encounter Code Description Active Date MDM Diagnosis [phone] Non-pressure chronic ulcer of other part of left lower leg with other specified 04/06/2021 No Yes severity E11.622 Type 2 diabetes mellitus with other skin ulcer 04/19/2021 No Yes I10 Essential (primary) hypertension 04/06/2021 No Yes H20.032 Secondary infectious iridocyclitis, left eye 04/12/2021 No Yes Inactive Problems Resolved  Problems Electronic Signature(s) Signed: 04/19/2021 12:15:31 PM By: Kalman Shan DO Entered By: Kalman Shan on 04/19/2021 11:56:16 -------------------------------------------------------------------------------- Progress Note Details Patient Name: Date of Service: Larry Sickles RLES E. 04/19/2021 8:45 A M Medical Record Number: [id number] Patient Account Number: [id number] Date of Birth/Sex: Treating RN: 1946-04-17 (55 y.o. Larry Welch Primary Care Provider: Elyn Peers Other Clinician: Referring Provider: Treating Provider/Extender: Arnette Schaumann Weeks in Treatment: 1 Subjective Chief Complaint Information obtained from Patient Left lower extremity wound History of Present Illness (HPI) Admission 04/06/2021 Larry Welch is a 75 year old male with a past medical history of type 2 diabetes on oral medications, essential hypertension and OSA who presents to the clinic for a 44-month history of nonhealing wound  to his left lower extremity. He states that he developed a wound from a pressure washer in the beginning of September. He states that the wound had improved to the point that it almost closed and then became significantly worse in the past couple weeks. He visited the ED on 04/04/2021 for this issue and was started on doxycycline. He currently denies signs of infection. 11/3; patient presents for follow-up. He has been using Hydrofera Blue without issues. He has no issues or complaints today. He denies signs of infection. He does not have pain to the wound site. 11/10; patient presents for follow-up. He has no issues or complaints today. He tolerated the compression wrap well. He denies signs of infection. Patient History Information obtained from Patient, Chart. Family History Unknown History. Social History Never smoker, Marital Status - Married, Alcohol Use - Rarely, Drug Use - No History, Caffeine Use - Rarely. Medical  History Cardiovascular Patient has history of Hypertension Denies history of Angina, Arrhythmia, Congestive Heart Failure, Coronary Artery Disease, Deep Vein Thrombosis, Hypotension, Myocardial Infarction, Peripheral Arterial Disease, Peripheral Venous Disease, Phlebitis, Vasculitis Integumentary (Skin) Denies history of History of Burn Hospitalization/Surgery History - back surgery/foot surgery/ radiation plant sreed. Medical A Surgical History Notes nd Cardiovascular hyperlipdemia Endocrine pre0diabetic Oncologic hx of prostate ca Objective Constitutional respirations regular, non-labored and within target range for patient.. Vitals Time Taken: 9:00 AM, Height: 72 in, Weight: 195 lbs, BMI: 26.4, Temperature: 98.1 F, Pulse: 57 bpm, Respiratory Rate: 16 breaths/min, Blood Pressure: 126/67 mmHg. Cardiovascular 2+ dorsalis pedis/posterior tibialis pulses. Psychiatric pleasant and cooperative. General Notes: 1 large open wound with pale Hyper granulated tissue present. No obvious signs of infection. 2+ pitting edema to the shin. Integumentary (Hair, Skin) Wound #1 status is Open. Original cause of wound was Trauma. The date acquired was: 02/08/2021. The wound has been in treatment 1 weeks. The wound is located on the Left,Anterior Lower Leg. The wound measures 3.5cm length x 2.6cm width x 0.7cm depth; 7.147cm^2 area and 5.003cm^3 volume. There is Fat Layer (Subcutaneous Tissue) exposed. There is no tunneling or undermining noted. There is a medium amount of serosanguineous drainage noted. The wound margin is distinct with the outline attached to the wound base. There is large (67-100%) red, pink, hyper - granulation within the wound bed. There is a small (1- 33%) amount of necrotic tissue within the wound bed including Adherent Slough. Assessment Active Problems ICD-10 Non-pressure chronic ulcer of other part of left lower leg with other specified severity Type 2 diabetes mellitus  with other skin ulcer Essential (primary) hypertension Secondary infectious iridocyclitis, left eye Patient's wound is stable. I used silver nitrate to the hyper granulated areas. Tissue appears slightly healthier than last clinic visit. We will continue with Hydrofera Blue under compression therapy. No signs of infection on exam. Patient had ABIs with TBI's that showed an ABI of 1.22 and TBI of 1.07 on the left. Triphasic waveforms. Procedures Wound #1 Pre-procedure diagnosis of Wound #1 is a Trauma, Other located on the Left,Anterior Lower Leg . An Chemical Cauterization procedure was performed by Kalman Shan, DO. Post procedure Diagnosis Wound #1: Same as Pre-Procedure Notes: silver nitrate Plan Follow-up Appointments: Return Appointment in 1 week. - Dr. Heber [place] Bathing/ Shower/ Hygiene: May shower with protection but do not get wound dressing(s) wet. Edema Control - Lymphedema / SCD / Other: Elevate legs to the level of the heart or above for 30 minutes daily and/or when sitting, a frequency of: - throughout the day Avoid standing for  long periods of time. WOUND #1: - Lower Leg Wound Laterality: Left, Anterior Cleanser: Soap and Water Every Other Day/15 Days Discharge Instructions: May shower and wash wound with dial antibacterial soap and water prior to dressing change. Cleanser: Wound Cleanser (Generic) Every Other Day/15 Days Discharge Instructions: Cleanse the wound with wound cleanser prior to applying a clean dressing using gauze sponges, not tissue or cotton balls. Peri-Wound Care: Sween Lotion (Moisturizing lotion) Every Other Day/15 Days Discharge Instructions: Apply moisturizing lotion as directed Prim Dressing: Hydrofera Blue Classic Foam, 4x4 in (Generic) Every Other Day/15 Days ary Discharge Instructions: Moisten with saline prior to applying to wound bed Secondary Dressing: Woven Gauze Sponge, Non-Sterile 4x4 in (Generic) Every Other Day/15 Days Discharge  Instructions: Apply over primary dressing as directed. Secondary Dressing: ABD Pad, 5x9 (Generic) Every Other Day/15 Days Discharge Instructions: Apply over primary dressing as directed. Secured With: 89M Medipore H Soft Cloth Surgical T ape, 4 x 10 (in/yd) (Generic) Every Other Day/15 Days Discharge Instructions: Secure with tape as directed. Com pression Wrap: Kerlix Roll 4.5x3.1 (in/yd) Every Other Day/15 Days Discharge Instructions: Apply Kerlix and Coban compression as directed. Com pression Wrap: Coban Self-Adherent Wrap 4x5 (in/yd) Every Other Day/15 Days Discharge Instructions: Apply over Kerlix as directed. 1. Silver nitrate 2. Hydrofera Blue under Kerlix/Coban 3. Follow-up in 1 week Electronic Signature(s) Signed: 04/19/2021 12:15:31 PM By: Kalman Shan DO Entered By: Kalman Shan on 04/19/2021 12:02:13 -------------------------------------------------------------------------------- HxROS Details Patient Name: Date of Service: Larry Welch, Larry RLES E. 04/19/2021 8:45 A M Medical Record Number: [id number] Patient Account Number: [id number] Date of Birth/Sex: Treating RN: Nov 05, 1945 (60 y.o. Larry Welch Primary Care Provider: Elyn Peers Other Clinician: Referring Provider: Treating Provider/Extender: Arnette Schaumann Weeks in Treatment: 1 Information Obtained From Patient Chart Cardiovascular Medical History: Positive for: Hypertension Negative for: Angina; Arrhythmia; Congestive Heart Failure; Coronary Artery Disease; Deep Vein Thrombosis; Hypotension; Myocardial Infarction; Peripheral Arterial Disease; Peripheral Venous Disease; Phlebitis; Vasculitis Past Medical History Notes: hyperlipdemia Endocrine Medical History: Past Medical History Notes: pre0diabetic Integumentary (Skin) Medical History: Negative for: History of Burn Oncologic Medical History: Past Medical History Notes: hx of prostate ca Immunizations Pneumococcal  Vaccine: Received Pneumococcal Vaccination: No Implantable Devices None Hospitalization / Surgery History Type of Hospitalization/Surgery back surgery/foot surgery/ radiation plant sreed Family and Social History Unknown History: Yes; Never smoker; Marital Status - Married; Alcohol Use: Rarely; Drug Use: No History; Caffeine Use: Rarely; Financial Concerns: No; Food, Clothing or Shelter Needs: No; Support System Lacking: No; Transportation Concerns: No Electronic Signature(s) Signed: 04/19/2021 12:15:31 PM By: Kalman Shan DO Signed: 04/20/2021 12:27:53 PM By: Rhae Hammock RN Entered By: Kalman Shan on 04/19/2021 11:57:47 -------------------------------------------------------------------------------- SuperBill Details Patient Name: Date of Service: Larry Sickles RLES E. 04/19/2021 Medical Record Number: [id number] Patient Account Number: [id number] Date of Birth/Sex: Treating RN: 1945/12/11 (73 y.o. 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, left eye Facility Procedures CPT4 Code: [id number] Description: 63149 - CHEM CAUT GRANULATION TISS ICD-10 Diagnosis Description L97.828 Non-pressure chronic ulcer of other part of left lower leg with other specified s Modifier: everity Quantity: 1 Physician Procedures : CPT4 Code Description Modifier [id number] - WC PHYS LEVEL 3 -  EST PT ICD-10 Diagnosis Description L97.828 Non-pressure chronic ulcer of other part of left lower leg with other specified severity I10 Essential (primary) hypertension E11.622 Type 2  diabetes  mellitus with other skin ulcer H20.032 Secondary infectious iridocyclitis, left eye Quantity: 1 : [id number] - WC PHYS CHEM CAUT GRAN TISSUE ICD-10 Diagnosis Description L97.828 Non-pressure chronic ulcer of other part of left lower leg with other specified severity Quantity: 1 Electronic Signature(s) Signed: 04/19/2021 12:15:31 PM By: Kalman Shan DO Signed: 04/19/2021 12:15:31 PM By: Kalman Shan DO Entered By: Kalman Shan on 04/19/2021 12:02:30

## 2021-04-20 NOTE — Progress Notes (Signed)
SAVION, WASHAM ([id number]) Visit Report for 04/19/2021 Arrival Information Details Patient Name: Date of Service: Larry Welch 04/19/2021 8:45 A M Medical Record Number: [id number] Patient Account Number: [id number] Date of Birth/Sex: Treating RN: 1946-02-26 (75 y.o. Larry Welch Primary Care Larry Welch: Elyn Peers Other Clinician: Referring Larry Welch: Treating Larry Welch/Extender: Arnette Schaumann Weeks in Treatment: 1 Visit Information History Since Last Visit Added or deleted any medications: No Patient Arrived: Ambulatory Any new allergies or adverse reactions: No Arrival Time: 08:58 Had a fall or experienced change in No Transfer Assistance: None activities of daily living that may affect Patient Identification Verified: Yes risk of falls: Secondary Verification Process Completed: Yes Signs or symptoms of abuse/neglect since last visito No Patient Requires Transmission-Based Precautions: No Hospitalized since last visit: No Patient Has Alerts: Yes Implantable device outside of the clinic excluding No Patient Alerts: R ABI: 1.2 TBI: 0.88 cellular tissue based products placed in the center L ABI: 1.22 TBI: 1.07 since last visit: Has Dressing in Place as Prescribed: Yes Has Compression in Place as Prescribed: Yes Pain Present Now: No Electronic Signature(s) Signed: 04/19/2021 5:34:00 PM By: Levan Hurst RN, BSN Entered By: Levan Hurst on 04/19/2021 09:40:10 -------------------------------------------------------------------------------- Encounter Discharge Information Details Patient Name: Date of Service: Larry Sickles RLES E. 04/19/2021 8:45 A M Medical Record Number: [id number] Patient Account Number: [id number] Date of Birth/Sex: Treating RN: 1946-03-28 (65 y.o. Larry Welch Primary Care Larry Welch: Elyn Peers Other Clinician: Referring Larry Welch: Treating Larry Welch/Extender: Larry Welch in Treatment:  1 Encounter Discharge Information Items Discharge Condition: Stable Ambulatory Status: Ambulatory Discharge Destination: Home Transportation: Private Auto Accompanied By: alone Schedule Follow-up Appointment: Yes Clinical Summary of Care: Patient Declined Electronic Signature(s) Signed: 04/19/2021 5:34:00 PM By: Levan Hurst RN, BSN Entered By: Levan Hurst on 04/19/2021 10:21:58 -------------------------------------------------------------------------------- Lower Extremity Assessment Details Patient Name: Date of Service: Larry Sickles RLES E. 04/19/2021 8:45 A M Medical Record Number: [id number] Patient Account Number: [id number] Date of Birth/Sex: Treating RN: [birth year] (75 y.o. Larry Welch Primary Care Marciano Mundt: Elyn Peers Other Clinician: Referring Larry Welch: Treating Laiylah Roettger/Extender: Arnette Schaumann Weeks in Treatment: 1 Edema Assessment Assessed: [Left: Yes] [Right: No] Edema: [Left: Ye] [Right: s] Calf Left: Right: Point of Measurement: 41 cm From Medial Instep 40 cm Ankle Left: Right: Point of Measurement: 8 cm From Medial Instep 24 cm Vascular Assessment Pulses: Dorsalis Pedis Palpable: [Left:Yes] Electronic Signature(s) Signed: 04/19/2021 5:16:41 PM By: Lorrin Jackson Entered By: Lorrin Jackson on 04/19/2021 09:04:50 -------------------------------------------------------------------------------- Multi Wound Chart Details Patient Name: Date of Service: Larry Sickles RLES E. 04/19/2021 8:45 A M Medical Record Number: [id number] Patient Account Number: [id number] Date of Birth/Sex: Treating RN: 20-Dec-1945 (75 y.o. Larry Welch, Larry Welch Primary Care Larry Welch: Elyn Peers Other Clinician: Referring Larry Welch: Treating Sanchez Hemmer/Extender: Arnette Schaumann Weeks in Treatment: 1 Vital Signs Height(in): 72 Pulse(bpm): 57 Weight(lbs): 195 Blood Pressure(mmHg): 126/67 Body Mass Index(BMI): 26 Temperature(F):  98.1 Respiratory Rate(breaths/min): 16 Photos: [N/A:N/A] Left, Anterior Lower Leg N/A N/A Wound Location: Trauma N/A N/A Wounding Event: Trauma, Other N/A N/A Primary Etiology: Hypertension N/A N/A Comorbid History: 02/08/2021 N/A N/A Date Acquired: 1 N/A N/A Weeks of Treatment: Open N/A N/A Wound Status: Yes N/A N/A Clustered Wound: 3.5x2.6x0.7 N/A N/A Measurements L x W x D (cm) 7.147 N/A N/A A (cm) : rea 5.003 N/A N/A Volume (cm) : 31.10% N/A N/A % Reduction in A rea: 59.80% N/A N/A % Reduction  in Volume: Full Thickness Without Exposed N/A N/A Classification: Support Structures Medium N/A N/A Exudate Amount: Serosanguineous N/A N/A Exudate Type: red, brown N/A N/A Exudate Color: Distinct, outline attached N/A N/A Wound Margin: Large (67-100%) N/A N/A Granulation Amount: Red, Pink, Hyper-granulation N/A N/A Granulation Quality: Small (1-33%) N/A N/A Necrotic Amount: Fat Layer (Subcutaneous Tissue): Yes N/A N/A Exposed Structures: Fascia: No Tendon: No Muscle: No Joint: No Bone: No Small (1-33%) N/A N/A Epithelialization: Chemical Cauterization N/A N/A Procedures Performed: Treatment Notes Wound #1 (Lower Leg) Wound Laterality: Left, Anterior Cleanser Soap and Water Discharge Instruction: May shower and wash wound with dial antibacterial soap and water prior to dressing change. Wound Cleanser Discharge Instruction: Cleanse the wound with wound cleanser prior to applying a clean dressing using gauze sponges, not tissue or cotton balls. Peri-Wound Care Sween Lotion (Moisturizing lotion) Discharge Instruction: Apply moisturizing lotion as directed Topical Primary Dressing Hydrofera Blue Classic Foam, 4x4 in Discharge Instruction: Moisten with saline prior to applying to wound bed Secondary Dressing Woven Gauze Sponge, Non-Sterile 4x4 in Discharge Instruction: Apply over primary dressing as directed. ABD Pad, 5x9 Discharge Instruction: Apply  over primary dressing as directed. Secured With 106M Medipore H Soft Cloth Surgical T ape, 4 x 10 (in/yd) Discharge Instruction: Secure with tape as directed. Compression Wrap Kerlix Roll 4.5x3.1 (in/yd) Discharge Instruction: Apply Kerlix and Coban compression as directed. Coban Self-Adherent Wrap 4x5 (in/yd) Discharge Instruction: Apply over Kerlix as directed. Compression Stockings Add-Ons Electronic Signature(s) Signed: 04/19/2021 12:15:31 PM By: Kalman Shan DO Signed: 04/20/2021 12:27:53 PM By: Rhae Hammock RN Entered By: Kalman Shan on 04/19/2021 11:56:58 -------------------------------------------------------------------------------- Multi-Disciplinary Care Plan Details Patient Name: Date of Service: Larry Sickles RLES E. 04/19/2021 8:45 A M Medical Record Number: [id number] Patient Account Number: [id number] Date of Birth/Sex: Treating RN: 07-23-1945 (75 y.o. Larry Welch Primary Care Aaryan Essman: Elyn Peers Other Clinician: Referring Paisyn Guercio: Treating Shivaun Bilello/Extender: Arnette Schaumann Weeks in Treatment: 1 Active Inactive Wound/Skin Impairment Nursing Diagnoses: Impaired tissue integrity Knowledge deficit related to ulceration/compromised skin integrity Goals: Patient will have a decrease in wound volume by X% from date: (specify in notes) Date Initiated: 04/06/2021 Target Resolution Date: 08/02/2021 Goal Status: Active Patient/caregiver will verbalize understanding of skin care regimen Date Initiated: 04/06/2021 Target Resolution Date: 05/10/2021 Goal Status: Active Ulcer/skin breakdown will have a volume reduction of 30% by week 4 Date Initiated: 04/06/2021 Target Resolution Date: 05/10/2021 Goal Status: Active Ulcer/skin breakdown will have a volume reduction of 50% by week 8 Date Initiated: 04/06/2021 Target Resolution Date: 06/07/2021 Goal Status: Active Interventions: Assess patient/caregiver ability to obtain necessary  supplies Assess patient/caregiver ability to perform ulcer/skin care regimen upon admission and as needed Assess ulceration(s) every visit Notes: Electronic Signature(s) Signed: 04/19/2021 5:16:41 PM By: Lorrin Jackson Entered By: Lorrin Jackson on 04/19/2021 09:11:09 -------------------------------------------------------------------------------- Pain Assessment Details Patient Name: Date of Service: Larry Sickles RLES E. 04/19/2021 8:45 A M Medical Record Number: [id number] Patient Account Number: [id number] Date of Birth/Sex: Treating RN: 1945-11-16 (14 y.o. Larry Welch Primary Care Kiran Lapine: Elyn Peers Other Clinician: Referring Taylen Osorto: Treating Assata Juncaj/Extender: Arnette Schaumann Weeks in Treatment: 1 Active Problems Location of Pain Severity and Description of Pain Patient Has Paino No Site Locations Pain Management and Medication Current Pain Management: Electronic Signature(s) Signed: 04/19/2021 5:16:41 PM By: Lorrin Jackson Entered By: Lorrin Jackson on 04/19/2021 09:01:33 -------------------------------------------------------------------------------- Patient/Caregiver Education Details Patient Name: Date of Service: Larry Welch 11/10/2022andnbsp8:45 A M Medical Record Number: [id number] Patient  Account Number: [id number] Date of Birth/Gender: Treating RN: [birth year] (59 y.o. Larry Welch Primary Care Physician: Elyn Peers Other Clinician: Referring Physician: Treating Physician/Extender: Larry Welch in Treatment: 1 Education Assessment Education Provided To: Patient Education Topics Provided Wound/Skin Impairment: Methods: Explain/Verbal Responses: State content correctly Electronic Signature(s) Signed: 04/19/2021 5:34:00 PM By: Levan Hurst RN, BSN Entered By: Levan Hurst on 04/19/2021 09:48:43 -------------------------------------------------------------------------------- Wound  Assessment Details Patient Name: Date of Service: Larry Sickles RLES E. 04/19/2021 8:45 A M Medical Record Number: [id number] Patient Account Number: [id number] Date of Birth/Sex: Treating RN: [birth year] (74 y.o. Larry Welch Primary Care Suraya Vidrine: Elyn Peers Other Clinician: Referring Christiaan Strebeck: Treating Kauri Garson/Extender: Arnette Schaumann Weeks in Treatment: 1 Wound Status Wound Number: 1 Primary Etiology: Trauma, Other Wound Location: Left, Anterior Lower Leg Wound Status: Open Wounding Event: Trauma Comorbid History: Hypertension Date Acquired: 02/08/2021 Weeks Of Treatment: 1 Clustered Wound: Yes Photos Wound Measurements Length: (cm) 3.5 Width: (cm) 2.6 Depth: (cm) 0.7 Area: (cm) 7.147 Volume: (cm) 5.003 % Reduction in Area: 31.1% % Reduction in Volume: 59.8% Epithelialization: Small (1-33%) Tunneling: No Undermining: No Wound Description Classification: Full Thickness Without Exposed Support Structures Wound Margin: Distinct, outline attached Exudate Amount: Medium Exudate Type: Serosanguineous Exudate Color: red, brown Foul Odor After Cleansing: No Slough/Fibrino Yes Wound Bed Granulation Amount: Large (67-100%) Exposed Structure Granulation Quality: Red, Pink, Hyper-granulation Fascia Exposed: No Necrotic Amount: Small (1-33%) Fat Layer (Subcutaneous Tissue) Exposed: Yes Necrotic Quality: Adherent Slough Tendon Exposed: No Muscle Exposed: No Joint Exposed: No Bone Exposed: No Treatment Notes Wound #1 (Lower Leg) Wound Laterality: Left, Anterior Cleanser Soap and Water Discharge Instruction: May shower and wash wound with dial antibacterial soap and water prior to dressing change. Wound Cleanser Discharge Instruction: Cleanse the wound with wound cleanser prior to applying a clean dressing using gauze sponges, not tissue or cotton balls. Peri-Wound Care Sween Lotion (Moisturizing lotion) Discharge Instruction: Apply moisturizing  lotion as directed Topical Primary Dressing Hydrofera Blue Classic Foam, 4x4 in Discharge Instruction: Moisten with saline prior to applying to wound bed Secondary Dressing Woven Gauze Sponge, Non-Sterile 4x4 in Discharge Instruction: Apply over primary dressing as directed. ABD Pad, 5x9 Discharge Instruction: Apply over primary dressing as directed. Secured With 83M Medipore H Soft Cloth Surgical T ape, 4 x 10 (in/yd) Discharge Instruction: Secure with tape as directed. Compression Wrap Kerlix Roll 4.5x3.1 (in/yd) Discharge Instruction: Apply Kerlix and Coban compression as directed. Coban Self-Adherent Wrap 4x5 (in/yd) Discharge Instruction: Apply over Kerlix as directed. Compression Stockings Add-Ons Electronic Signature(s) Signed: 04/19/2021 5:16:41 PM By: Lorrin Jackson Entered By: Lorrin Jackson on 04/19/2021 09:12:23 -------------------------------------------------------------------------------- Vitals Details Patient Name: Date of Service: [name], [name] 04/19/2021 8:45 A M Medical Record Number: [id number] Patient Account Number: [id number] Date of Birth/Sex: Treating RN: [birth year] (75 y.o. Larry Welch Primary Care Ethridge Sollenberger: Elyn Peers Other Clinician: Referring Kahlani Graber: Treating Ladawna Walgren/Extender: Arnette Schaumann Weeks in Treatment: 1 Vital Signs Time Taken: 09:00 Temperature (F): 98.1 Height (in): 72 Pulse (bpm): 57 Weight (lbs): 195 Respiratory Rate (breaths/min): 16 Body Mass Index (BMI): 26.4 Blood Pressure (mmHg): 126/67 Reference Range: 80 - 120 mg / dl Electronic Signature(s) Signed: 04/19/2021 5:16:41 PM By: Lorrin Jackson Entered By: Lorrin Jackson on 04/19/2021 09:01:26

## 2021-04-23 DIAGNOSIS — I10 Essential (primary) hypertension: Secondary | ICD-10-CM | POA: Diagnosis not present

## 2021-04-23 DIAGNOSIS — M130 Polyarthritis, unspecified: Secondary | ICD-10-CM | POA: Diagnosis not present

## 2021-04-23 DIAGNOSIS — E1169 Type 2 diabetes mellitus with other specified complication: Secondary | ICD-10-CM | POA: Diagnosis not present

## 2021-04-23 DIAGNOSIS — E782 Mixed hyperlipidemia: Secondary | ICD-10-CM | POA: Diagnosis not present

## 2021-04-23 DIAGNOSIS — Z0001 Encounter for general adult medical examination with abnormal findings: Secondary | ICD-10-CM | POA: Diagnosis not present

## 2021-04-23 DIAGNOSIS — G5691 Unspecified mononeuropathy of right upper limb: Secondary | ICD-10-CM | POA: Diagnosis not present

## 2021-04-23 DIAGNOSIS — S81802A Unspecified open wound, left lower leg, initial encounter: Secondary | ICD-10-CM | POA: Diagnosis not present

## 2021-04-26 ENCOUNTER — Encounter (HOSPITAL_BASED_OUTPATIENT_CLINIC_OR_DEPARTMENT_OTHER): Payer: Medicare PPO | Admitting: Internal Medicine

## 2021-04-26 ENCOUNTER — Other Ambulatory Visit: Payer: Self-pay

## 2021-04-26 DIAGNOSIS — E11622 Type 2 diabetes mellitus with other skin ulcer: Secondary | ICD-10-CM

## 2021-04-26 DIAGNOSIS — I10 Essential (primary) hypertension: Secondary | ICD-10-CM

## 2021-04-26 DIAGNOSIS — L97828 Non-pressure chronic ulcer of other part of left lower leg with other specified severity: Secondary | ICD-10-CM

## 2021-04-26 DIAGNOSIS — H20032 Secondary infectious iridocyclitis, left eye: Secondary | ICD-10-CM | POA: Diagnosis not present

## 2021-04-26 NOTE — Progress Notes (Signed)
NICOLI, NARDOZZI ([id number]) Visit Report for 04/26/2021 Arrival Information Details Patient Name: Date of Service: Larry Welch RLES E. 04/26/2021 8:45 A M Medical Record Number: [id number] Patient Account Number: [id number] Date of Birth/Sex: Treating RN: [birth year] (75 y.o. [name], [name] Primary Care Tyrika Newman: Elyn Peers Other Clinician: Referring Arzu Mcgaughey: Treating Regina Coppolino/Extender: Arnette Schaumann Weeks in Treatment: 2 Visit Information History Since Last Visit Added or deleted any medications: No Patient Arrived: Ambulatory Any new allergies or adverse reactions: No Arrival Time: 08:38 Had a fall or experienced change in No Accompanied By: self activities of daily living that may affect Transfer Assistance: None risk of falls: Patient Identification Verified: Yes Signs or symptoms of abuse/neglect since last visito No Secondary Verification Process Completed: Yes Hospitalized since last visit: No Patient Requires Transmission-Based Precautions: No Implantable device outside of the clinic excluding No Patient Has Alerts: Yes cellular tissue based products placed in the center Patient Alerts: R ABI: 1.2 TBI: 0.88 since last visit: L ABI: 1.22 TBI: 1.07 Has Dressing in Place as Prescribed: Yes Pain Present Now: No Electronic Signature(s) Signed: 04/26/2021 4:04:47 PM By: Sandre Kitty Entered By: Sandre Kitty on 04/26/2021 08:39:18 -------------------------------------------------------------------------------- Encounter Discharge Information Details Patient Name: Date of Service: [name], [name] 04/26/2021 8:45 A M Medical Record Number: [id number] Patient Account Number: [id number] Date of Birth/Sex: Treating RN: [birth year] (75 y.o. [name] Primary Care Sajjad Honea: Elyn Peers Other Clinician: Referring Brynn Mulgrew: Treating Laureen Frederic/Extender: Jeannine Boga in Treatment: 2 Encounter Discharge  Information Items Discharge Condition: Stable Ambulatory Status: Ambulatory Discharge Destination: Home Transportation: Private Auto Accompanied By: alone Schedule Follow-up Appointment: Yes Clinical Summary of Care: Patient Declined Electronic Signature(s) Signed: 04/26/2021 5:23:58 PM By: Levan Hurst RN, BSN Entered By: Levan Hurst on 04/26/2021 09:35:06 -------------------------------------------------------------------------------- Lower Extremity Assessment Details Patient Name: Date of Service: Larry Welch RLES E. 04/26/2021 8:45 A M Medical Record Number: [id number] Patient Account Number: [id number] Date of Birth/Sex: Treating RN: August 23, 1945 (75 y.o. [name] Primary Care Quinlyn Tep: Elyn Peers Other Clinician: Referring Divonte Senger: Treating Yailyn Strack/Extender: Arnette Schaumann Weeks in Treatment: 2 Edema Assessment Assessed: [Left: No] [Right: No] Edema: [Left: Ye] [Right: s] Calf Left: Right: Point of Measurement: 41 cm From Medial Instep 40 cm Ankle Left: Right: Point of Measurement: 8 cm From Medial Instep 24 cm Vascular Assessment Pulses: Dorsalis Pedis Palpable: [Left:Yes] Electronic Signature(s) Signed: 04/26/2021 5:23:58 PM By: Levan Hurst RN, BSN Entered By: Levan Hurst on 04/26/2021 09:07:00 -------------------------------------------------------------------------------- Multi Wound Chart Details Patient Name: Date of Service: Larry Welch RLES E. 04/26/2021 8:45 A M Medical Record Number: [id number] Patient Account Number: [id number] Date of Birth/Sex: Treating RN: [birth year] (75 y.o. [name], [name] Primary Care Akil Hoos: Elyn Peers Other Clinician: Referring Ardenia Stiner: Treating Kevyn Boquet/Extender: Arnette Schaumann Weeks in Treatment: 2 Vital Signs Height(in): 72 Pulse(bpm): 55 Weight(lbs): 195 Blood Pressure(mmHg): 138/83 Body Mass Index(BMI): 26 Temperature(F): 98.4 Respiratory  Rate(breaths/min): 16 Photos: [1:Left, Anterior Lower Leg] [N/A:N/A N/A] Wound Location: [1:Trauma] [N/A:N/A] Wounding Event: [1:Trauma, Other] [N/A:N/A] Primary Etiology: [1:Hypertension] [N/A:N/A] Comorbid History: [1:02/08/2021] [N/A:N/A] Date Acquired: [1:2] [N/A:N/A] Weeks of Treatment: [1:Open] [N/A:N/A] Wound Status: [1:Yes] [N/A:N/A] Clustered Wound: [1:4.5x2x0.8] [N/A:N/A] Measurements L x W x D (cm) [1:7.069] [N/A:N/A] A (cm) : rea [1:5.655] [N/A:N/A] Volume (cm) : [1:31.80%] [N/A:N/A] % Reduction in A rea: [1:54.50%] [N/A:N/A] % Reduction in Volume: [1:Full Thickness Without Exposed] [N/A:N/A] Classification: [1:Support Structures Medium] [N/A:N/A] Exudate Amount: [1:Serosanguineous] [N/A:N/A] Exudate  Type: [1:red, brown] [N/A:N/A] Exudate Color: [1:Distinct, outline attached] [N/A:N/A] Wound Margin: [1:Large (67-100%)] [N/A:N/A] Granulation Amount: [1:Red, Pink, Hyper-granulation] [N/A:N/A] Granulation Quality: [1:Small (1-33%)] [N/A:N/A] Necrotic Amount: [1:Fat Layer (Subcutaneous Tissue): Yes N/A] Exposed Structures: [1:Fascia: No Tendon: No Muscle: No Joint: No Bone: No Small (1-33%)] [N/A:N/A] Epithelialization: [1:Chemical Cauterization] [N/A:N/A] Treatment Notes Wound #1 (Lower Leg) Wound Laterality: Left, Anterior Cleanser Soap and Water Discharge Instruction: May shower and wash wound with dial antibacterial soap and water prior to dressing change. Wound Cleanser Discharge Instruction: Cleanse the wound with wound cleanser prior to applying a clean dressing using gauze sponges, not tissue or cotton balls. Peri-Wound Care Sween Lotion (Moisturizing lotion) Discharge Instruction: Apply moisturizing lotion as directed Topical Primary Dressing Hydrofera Blue Classic Foam, 4x4 in Discharge Instruction: Moisten with saline prior to applying to wound bed Secondary Dressing Woven Gauze Sponge, Non-Sterile 4x4 in Discharge Instruction: Apply over primary  dressing as directed. ABD Pad, 5x9 Discharge Instruction: Apply over primary dressing as directed. Secured With 46M Medipore H Soft Cloth Surgical T ape, 4 x 10 (in/yd) Discharge Instruction: Secure with tape as directed. Compression Wrap Kerlix Roll 4.5x3.1 (in/yd) Discharge Instruction: Apply Kerlix and Coban compression as directed. Coban Self-Adherent Wrap 4x5 (in/yd) Discharge Instruction: Apply over Kerlix as directed. Compression Stockings Add-Ons Electronic Signature(s) Signed: 04/26/2021 9:45:34 AM By: Kalman Shan DO Signed: 04/26/2021 5:03:29 PM By: Rhae Hammock RN Entered By: Kalman Shan on 04/26/2021 09:36:53 -------------------------------------------------------------------------------- Multi-Disciplinary Care Plan Details Patient Name: Date of Service: [name], [name] 04/26/2021 8:45 A M Medical Record Number: [id number] Patient Account Number: [id number] Date of Birth/Sex: Treating RN: December 06, 1945 (75 y.o. [name] Primary Care Gaius Ishaq: Elyn Peers Other Clinician: Referring Sybilla Malhotra: Treating Malyiah Fellows/Extender: Arnette Schaumann Weeks in Treatment: 2 Active Inactive Wound/Skin Impairment Nursing Diagnoses: Impaired tissue integrity Knowledge deficit related to ulceration/compromised skin integrity Goals: Patient/caregiver will verbalize understanding of skin care regimen Date Initiated: 04/06/2021 Target Resolution Date: 05/10/2021 Goal Status: Active Ulcer/skin breakdown will have a volume reduction of 30% by week 4 Date Initiated: 04/06/2021 Target Resolution Date: 05/10/2021 Goal Status: Active Ulcer/skin breakdown will have a volume reduction of 50% by week 8 Date Initiated: 04/06/2021 Target Resolution Date: 06/07/2021 Goal Status: Active Interventions: Assess patient/caregiver ability to obtain necessary supplies Assess patient/caregiver ability to perform ulcer/skin care regimen upon admission and as  needed Assess ulceration(s) every visit Notes: Electronic Signature(s) Signed: 04/26/2021 5:23:58 PM By: Levan Hurst RN, BSN Entered By: Levan Hurst on 04/26/2021 09:10:50 -------------------------------------------------------------------------------- Pain Assessment Details Patient Name: Date of Service: Larry Welch RLES E. 04/26/2021 8:45 A M Medical Record Number: [id number] Patient Account Number: [id number] Date of Birth/Sex: Treating RN: 1946-01-29 (75 y.o. [name], [name] Primary Care Tiaira Arambula: Elyn Peers Other Clinician: Referring Daiel Strohecker: Treating Momina Hunton/Extender: Arnette Schaumann Weeks in Treatment: 2 Active Problems Location of Pain Severity and Description of Pain Patient Has Paino No Site Locations Pain Management and Medication Current Pain Management: Electronic Signature(s) Signed: 04/26/2021 4:04:47 PM By: Sandre Kitty Signed: 04/26/2021 5:03:29 PM By: Rhae Hammock RN Entered By: Sandre Kitty on 04/26/2021 08:39:38 -------------------------------------------------------------------------------- Patient/Caregiver Education Details Patient Name: Date of Service: Orvan July 11/17/2022andnbsp8:45 A M Medical Record Number: [id number] Patient Account Number: [id number] Date of Birth/Gender: Treating RN: June 23, 1945 (57 y.o. [name] Primary Care Physician: Elyn Peers Other Clinician: Referring Physician: Treating Physician/Extender: Jeannine Boga in Treatment: 2 Education Assessment Education Provided To: Patient Education Topics Provided Wound/Skin  Impairment: Methods: Explain/Verbal Responses: State content correctly Electronic Signature(s) Signed: 04/26/2021 5:23:58 PM By: Levan Hurst RN, BSN Entered By: Levan Hurst on 04/26/2021 09:11:04 -------------------------------------------------------------------------------- Wound Assessment Details Patient  Name: Date of Service: Larry Welch RLES E. 04/26/2021 8:45 A M Medical Record Number: [id number] Patient Account Number: [id number] Date of Birth/Sex: Treating RN: 10/05/1945 (75 y.o. [name], [name] Primary Care Karmelo Bass: Elyn Peers Other Clinician: Referring Takasha Vetere: Treating Saisha Hogue/Extender: Arnette Schaumann Weeks in Treatment: 2 Wound Status Wound Number: 1 Primary Etiology: Trauma, Other Wound Location: Left, Anterior Lower Leg Wound Status: Open Wounding Event: Trauma Comorbid History: Hypertension Date Acquired: 02/08/2021 Weeks Of Treatment: 2 Clustered Wound: Yes Photos Wound Measurements Length: (cm) 4.5 Width: (cm) 2 Depth: (cm) 0.8 Area: (cm) 7.069 Volume: (cm) 5.655 % Reduction in Area: 31.8% % Reduction in Volume: 54.5% Epithelialization: Small (1-33%) Tunneling: No Undermining: No Wound Description Classification: Full Thickness Without Exposed Support Structures Wound Margin: Distinct, outline attached Exudate Amount: Medium Exudate Type: Serosanguineous Exudate Color: red, brown Foul Odor After Cleansing: No Slough/Fibrino Yes Wound Bed Granulation Amount: Large (67-100%) Exposed Structure Granulation Quality: Red, Pink, Hyper-granulation Fascia Exposed: No Necrotic Amount: Small (1-33%) Fat Layer (Subcutaneous Tissue) Exposed: Yes Necrotic Quality: Adherent Slough Tendon Exposed: No Muscle Exposed: No Joint Exposed: No Bone Exposed: No Treatment Notes Wound #1 (Lower Leg) Wound Laterality: Left, Anterior Cleanser Soap and Water Discharge Instruction: May shower and wash wound with dial antibacterial soap and water prior to dressing change. Wound Cleanser Discharge Instruction: Cleanse the wound with wound cleanser prior to applying a clean dressing using gauze sponges, not tissue or cotton balls. Peri-Wound Care Sween Lotion (Moisturizing lotion) Discharge Instruction: Apply moisturizing lotion as  directed Topical Primary Dressing Hydrofera Blue Classic Foam, 4x4 in Discharge Instruction: Moisten with saline prior to applying to wound bed Secondary Dressing Woven Gauze Sponge, Non-Sterile 4x4 in Discharge Instruction: Apply over primary dressing as directed. ABD Pad, 5x9 Discharge Instruction: Apply over primary dressing as directed. Secured With 5M Medipore H Soft Cloth Surgical T ape, 4 x 10 (in/yd) Discharge Instruction: Secure with tape as directed. Compression Wrap Kerlix Roll 4.5x3.1 (in/yd) Discharge Instruction: Apply Kerlix and Coban compression as directed. Coban Self-Adherent Wrap 4x5 (in/yd) Discharge Instruction: Apply over Kerlix as directed. Compression Stockings Add-Ons Electronic Signature(s) Signed: 04/26/2021 5:03:29 PM By: Rhae Hammock RN Signed: 04/26/2021 5:23:58 PM By: Levan Hurst RN, BSN Entered By: Levan Hurst on 04/26/2021 09:01:08 -------------------------------------------------------------------------------- [place] Details Patient Name: Date of Service: Larry Welch RLES E. 04/26/2021 8:45 A M Medical Record Number: [id number] Patient Account Number: [id number] Date of Birth/Sex: Treating RN: 1946-04-15 (75 y.o. [name], [name] Primary Care Moody Robben: Elyn Peers Other Clinician: Referring Marcina Kinnison: Treating Stanislawa Gaffin/Extender: Arnette Schaumann Weeks in Treatment: 2 Vital Signs Time Taken: 08:39 Temperature (F): 98.4 Height (in): 72 Pulse (bpm): 55 Weight (lbs): 195 Respiratory Rate (breaths/min): 16 Body Mass Index (BMI): 26.4 Blood Pressure (mmHg): 138/83 Reference Range: 80 - 120 mg / dl Electronic Signature(s) Signed: 04/26/2021 4:04:47 PM By: Sandre Kitty Entered By: Sandre Kitty on 04/26/2021 08:39:33

## 2021-04-26 NOTE — Progress Notes (Signed)
COURVOISIER, HAMBLEN ([id number]) Visit Report for 04/26/2021 Chief Complaint Document Details Patient Name: Date of Service: Larry Sickles RLES E. 04/26/2021 8:45 A M Medical Record Number: [id number] Patient Account Number: [id number] Date of Birth/Sex: Treating RN: Apr 03, 1946 (75 y.o. [name], [name] Primary Care Provider: Elyn Peers Other Clinician: Referring Provider: Treating Provider/Extender: Arnette Schaumann Weeks in Treatment: 2 Information Obtained from: Patient Chief Complaint Left lower extremity wound Electronic Signature(s) Signed: 04/26/2021 9:45:34 AM By: Kalman Shan DO Entered By: Kalman Shan on 04/26/2021 09:37:08 -------------------------------------------------------------------------------- HPI Details Patient Name: Date of Service: Larry Welch, Larry RLES E. 04/26/2021 8:45 A M Medical Record Number: [id number] Patient Account Number: [id number] Date of Birth/Sex: Treating RN: [birth year] (23 y.o. [name] Primary Care Provider: Elyn Peers Other Clinician: Referring Provider: Treating Provider/Extender: Arnette Schaumann Weeks in Treatment: 2 History of Present Illness HPI Description: Admission 04/06/2021 Larry Welch is a 75 year old male with a past medical history of type 2 diabetes on oral medications, essential hypertension and OSA who presents to the clinic for a 66-month history of nonhealing wound to his left lower extremity. He states that he developed a wound from a pressure washer in the beginning of September. He states that the wound had improved to the point that it almost closed and then became significantly worse in the past couple weeks. He visited the ED on 04/04/2021 for this issue and was started on doxycycline. He currently denies signs of infection. 11/3; patient presents for follow-up. He has been using Hydrofera Blue without issues. He has no issues or complaints today. He denies signs of  infection. He does not have pain to the wound site. 11/10; patient presents for follow-up. He has no issues or complaints today. He tolerated the compression wrap well. He denies signs of infection. 11/17; patient presents for 1 week follow-up. He has tolerated the compression wrap well. He has no issues or complaints today and denies signs of infection. Electronic Signature(s) Signed: 04/26/2021 9:45:34 AM By: Kalman Shan DO Entered By: Kalman Shan on 04/26/2021 09:37:38 -------------------------------------------------------------------------------- Chemical Cauterization Details Patient Name: Date of Service: Larry Sickles RLES E. 04/26/2021 8:45 A M Medical Record Number: [id number] Patient Account Number: [id number] Date of Birth/Sex: Treating RN: 01/23/1946 (22 y.o. [name] Primary Care Provider: Elyn Peers Other Clinician: Referring Provider: Treating Provider/Extender: Arnette Schaumann Weeks in Treatment: 2 Procedure Performed for: Wound #1 Left,Anterior Lower Leg Performed By: Physician Kalman Shan, DO Post Procedure Diagnosis Same as Pre-procedure Notes silver nitrate Electronic Signature(s) Signed: 04/26/2021 9:45:34 AM By: Kalman Shan DO Signed: 04/26/2021 5:23:58 PM By: Levan Hurst RN, BSN Entered By: Levan Hurst on 04/26/2021 09:08:21 -------------------------------------------------------------------------------- Physical Exam Details Patient Name: Date of Service: Larry Sickles RLES E. 04/26/2021 8:45 A M Medical Record Number: [id number] Patient Account Number: [id number] Date of Birth/Sex: Treating RN: 1946-02-09 (32 y.o. [name] Primary Care Provider: Elyn Peers Other Clinician: Referring Provider: Treating Provider/Extender: Arnette Schaumann Weeks in Treatment: 2 Constitutional respirations regular, non-labored and within target range for patient.Marland Kitchen Psychiatric pleasant and  cooperative. Notes Left lower extremity: Large open wound to the anterior aspect with hyper granulated tissue throughout. No obvious signs of infection. Good edema control Electronic Signature(s) Signed: 04/26/2021 9:45:34 AM By: Kalman Shan DO Entered By: Kalman Shan on 04/26/2021 09:38:32 -------------------------------------------------------------------------------- Physician Orders Details Patient Name: Date of Service: Larry Welch, Larry RLES E. 04/26/2021 8:45 A M Medical Record  Number: [id number] Patient Account Number: [id number] Date of Birth/Sex: Treating RN: 1946/05/31 (17 y.o. [name] Primary Care Provider: Elyn Peers Other Clinician: Referring Provider: Treating Provider/Extender: Arnette Schaumann Weeks in Treatment: 2 Verbal / Phone Orders: No Diagnosis Coding ICD-10 Coding Code Description [phone] Non-pressure chronic ulcer of other part of left lower leg with other specified severity E11.622 Type 2 diabetes mellitus with other skin ulcer I10 Essential (primary) hypertension H20.032 Secondary infectious iridocyclitis, left eye Follow-up Appointments ppointment in 2 weeks. - Dr. Heber [place] Return A Nurse Visit: - 1 week for rewrap Bathing/ Shower/ Hygiene May shower with protection but do not get wound dressing(s) wet. Edema Control - Lymphedema / SCD / Other Elevate legs to the level of the heart or above for 30 minutes daily and/or when sitting, a frequency of: - throughout the day Avoid standing for long periods of time. Wound Treatment Wound #1 - Lower Leg Wound Laterality: Left, Anterior Cleanser: Soap and Water Every Other Day/15 Days Discharge Instructions: May shower and wash wound with dial antibacterial soap and water prior to dressing change. Cleanser: Wound Cleanser (Generic) Every Other Day/15 Days Discharge Instructions: Cleanse the wound with wound cleanser prior to applying a clean dressing using gauze sponges, not tissue  or cotton balls. Peri-Wound Care: Sween Lotion (Moisturizing lotion) Every Other Day/15 Days Discharge Instructions: Apply moisturizing lotion as directed Prim Dressing: Hydrofera Blue Classic Foam, 4x4 in (Generic) Every Other Day/15 Days ary Discharge Instructions: Moisten with saline prior to applying to wound bed Secondary Dressing: Woven Gauze Sponge, Non-Sterile 4x4 in (Generic) Every Other Day/15 Days Discharge Instructions: Apply over primary dressing as directed. Secondary Dressing: ABD Pad, 5x9 (Generic) Every Other Day/15 Days Discharge Instructions: Apply over primary dressing as directed. Secured With: 67M Medipore H Soft Cloth Surgical T ape, 4 x 10 (in/yd) (Generic) Every Other Day/15 Days Discharge Instructions: Secure with tape as directed. Compression Wrap: Kerlix Roll 4.5x3.1 (in/yd) Every Other Day/15 Days Discharge Instructions: Apply Kerlix and Coban compression as directed. Compression Wrap: Coban Self-Adherent Wrap 4x5 (in/yd) Every Other Day/15 Days Discharge Instructions: Apply over Kerlix as directed. Electronic Signature(s) Signed: 04/26/2021 9:45:34 AM By: Kalman Shan DO Entered By: Kalman Shan on 04/26/2021 09:38:50 -------------------------------------------------------------------------------- Problem List Details Patient Name: Date of Service: Larry Sickles RLES E. 04/26/2021 8:45 A M Medical Record Number: [id number] Patient Account Number: [id number] Date of Birth/Sex: Treating RN: 1946-05-23 (5 y.o. [name] Primary Care Provider: Elyn Peers Other Clinician: Referring Provider: Treating Provider/Extender: Arnette Schaumann Weeks in Treatment: 2 Active Problems ICD-10 Encounter Encounter Code Description Active Date MDM Diagnosis [phone] Non-pressure chronic ulcer of other part of left lower leg with other specified 04/06/2021 No Yes severity E11.622 Type 2 diabetes mellitus with other skin ulcer 04/19/2021 No  Yes I10 Essential (primary) hypertension 04/06/2021 No Yes H20.032 Secondary infectious iridocyclitis, left eye 04/12/2021 No Yes Inactive Problems Resolved Problems Electronic Signature(s) Signed: 04/26/2021 9:45:34 AM By: Kalman Shan DO Entered By: Kalman Shan on 04/26/2021 09:36:44 -------------------------------------------------------------------------------- Progress Note Details Patient Name: Date of Service: Larry Sickles RLES E. 04/26/2021 8:45 A M Medical Record Number: [id number] Patient Account Number: [id number] Date of Birth/Sex: Treating RN: Dec 15, 1945 (32 y.o. [name] Primary Care Provider: Elyn Peers Other Clinician: Referring Provider: Treating Provider/Extender: Arnette Schaumann Weeks in Treatment: 2 Subjective Chief Complaint Information obtained from Patient Left lower extremity wound History of Present Illness (HPI) Admission 04/06/2021 Larry Welch is a  75 year old male with a past medical history of type 2 diabetes on oral medications, essential hypertension and OSA who presents to the clinic for a 66-month history of nonhealing wound to his left lower extremity. He states that he developed a wound from a pressure washer in the beginning of September. He states that the wound had improved to the point that it almost closed and then became significantly worse in the past couple weeks. He visited the ED on 04/04/2021 for this issue and was started on doxycycline. He currently denies signs of infection. 11/3; patient presents for follow-up. He has been using Hydrofera Blue without issues. He has no issues or complaints today. He denies signs of infection. He does not have pain to the wound site. 11/10; patient presents for follow-up. He has no issues or complaints today. He tolerated the compression wrap well. He denies signs of infection. 11/17; patient presents for 1 week follow-up. He has tolerated the compression wrap  well. He has no issues or complaints today and denies signs of infection. Patient History Information obtained from Patient, Chart. Family History Unknown History. Social History Never smoker, Marital Status - Married, Alcohol Use - Rarely, Drug Use - No History, Caffeine Use - Rarely. Medical History Cardiovascular Patient has history of Hypertension Denies history of Angina, Arrhythmia, Congestive Heart Failure, Coronary Artery Disease, Deep Vein Thrombosis, Hypotension, Myocardial Infarction, Peripheral Arterial Disease, Peripheral Venous Disease, Phlebitis, Vasculitis Integumentary (Skin) Denies history of History of Burn Hospitalization/Surgery History - back surgery/foot surgery/ radiation plant sreed. Medical A Surgical History Notes nd Cardiovascular hyperlipdemia Endocrine pre0diabetic Oncologic hx of prostate ca Objective Constitutional respirations regular, non-labored and within target range for patient.. Vitals Time Taken: 8:39 AM, Height: 72 in, Weight: 195 lbs, BMI: 26.4, Temperature: 98.4 F, Pulse: 55 bpm, Respiratory Rate: 16 breaths/min, Blood Pressure: 138/83 mmHg. Psychiatric pleasant and cooperative. General Notes: Left lower extremity: Large open wound to the anterior aspect with hyper granulated tissue throughout. No obvious signs of infection. Good edema control Integumentary (Hair, Skin) Wound #1 status is Open. Original cause of wound was Trauma. The date acquired was: 02/08/2021. The wound has been in treatment 2 weeks. The wound is located on the Left,Anterior Lower Leg. The wound measures 4.5cm length x 2cm width x 0.8cm depth; 7.069cm^2 area and 5.655cm^3 volume. There is Fat Layer (Subcutaneous Tissue) exposed. There is no tunneling or undermining noted. There is a medium amount of serosanguineous drainage noted. The wound margin is distinct with the outline attached to the wound base. There is large (67-100%) red, pink, hyper - granulation within  the wound bed. There is a small (1- 33%) amount of necrotic tissue within the wound bed including Adherent Slough. Assessment Active Problems ICD-10 Non-pressure chronic ulcer of other part of left lower leg with other specified severity Type 2 diabetes mellitus with other skin ulcer Essential (primary) hypertension Secondary infectious iridocyclitis, left eye Patient has shown improvement in size and appearance since last clinic visit. I used silver nitrate to the hyper granulated areas. No signs of infection on exam. I recommended continuing Hydrofera Blue under compression. We discussed the potential use of skin substitute and patient was agreeable. At next clinic visit we will run IVR for skin substitute approval. Procedures Wound #1 Pre-procedure diagnosis of Wound #1 is a Trauma, Other located on the Left,Anterior Lower Leg . An Chemical Cauterization procedure was performed by Kalman Shan, DO. Post procedure Diagnosis Wound #1: Same as Pre-Procedure Notes: silver nitrate Plan Follow-up Appointments: Return Appointment in  2 weeks. - Dr. Heber [place] Nurse Visit: - 1 week for rewrap Bathing/ Shower/ Hygiene: May shower with protection but do not get wound dressing(s) wet. Edema Control - Lymphedema / SCD / Other: Elevate legs to the level of the heart or above for 30 minutes daily and/or when sitting, a frequency of: - throughout the day Avoid standing for long periods of time. WOUND #1: - Lower Leg Wound Laterality: Left, Anterior Cleanser: Soap and Water Every Other Day/15 Days Discharge Instructions: May shower and wash wound with dial antibacterial soap and water prior to dressing change. Cleanser: Wound Cleanser (Generic) Every Other Day/15 Days Discharge Instructions: Cleanse the wound with wound cleanser prior to applying a clean dressing using gauze sponges, not tissue or cotton balls. Peri-Wound Care: Sween Lotion (Moisturizing lotion) Every Other Day/15 Days Discharge  Instructions: Apply moisturizing lotion as directed Prim Dressing: Hydrofera Blue Classic Foam, 4x4 in (Generic) Every Other Day/15 Days ary Discharge Instructions: Moisten with saline prior to applying to wound bed Secondary Dressing: Woven Gauze Sponge, Non-Sterile 4x4 in (Generic) Every Other Day/15 Days Discharge Instructions: Apply over primary dressing as directed. Secondary Dressing: ABD Pad, 5x9 (Generic) Every Other Day/15 Days Discharge Instructions: Apply over primary dressing as directed. Secured With: 73M Medipore H Soft Cloth Surgical T ape, 4 x 10 (in/yd) (Generic) Every Other Day/15 Days Discharge Instructions: Secure with tape as directed. Com pression Wrap: Kerlix Roll 4.5x3.1 (in/yd) Every Other Day/15 Days Discharge Instructions: Apply Kerlix and Coban compression as directed. Com pression Wrap: Coban Self-Adherent Wrap 4x5 (in/yd) Every Other Day/15 Days Discharge Instructions: Apply over Kerlix as directed. 1. Silver nitrate 2. Hydrofera Blue under Kerlix/Coban 3. Follow-up in 1 week for nurse visit in 2 weeks with me Electronic Signature(s) Signed: 04/26/2021 9:45:34 AM By: Kalman Shan DO Entered By: Kalman Shan on 04/26/2021 09:43:58 -------------------------------------------------------------------------------- HxROS Details Patient Name: Date of Service: Larry Welch, Larry RLES E. 04/26/2021 8:45 A M Medical Record Number: [id number] Patient Account Number: [id number] Date of Birth/Sex: Treating RN: [birth year] (89 y.o. [name] Primary Care Provider: Elyn Peers Other Clinician: Referring Provider: Treating Provider/Extender: Arnette Schaumann Weeks in Treatment: 2 Information Obtained From Patient Chart Cardiovascular Medical History: Positive for: Hypertension Negative for: Angina; Arrhythmia; Congestive Heart Failure; Coronary Artery Disease; Deep Vein Thrombosis; Hypotension; Myocardial Infarction; Peripheral Arterial  Disease; Peripheral Venous Disease; Phlebitis; Vasculitis Past Medical History Notes: hyperlipdemia Endocrine Medical History: Past Medical History Notes: pre0diabetic Integumentary (Skin) Medical History: Negative for: History of Burn Oncologic Medical History: Past Medical History Notes: hx of prostate ca Immunizations Pneumococcal Vaccine: Received Pneumococcal Vaccination: No Implantable Devices None Hospitalization / Surgery History Type of Hospitalization/Surgery back surgery/foot surgery/ radiation plant sreed Family and Social History Unknown History: Yes; Never smoker; Marital Status - Married; Alcohol Use: Rarely; Drug Use: No History; Caffeine Use: Rarely; Financial Concerns: No; Food, Clothing or Shelter Needs: No; Support System Lacking: No; Transportation Concerns: No Electronic Signature(s) Signed: 04/26/2021 9:45:34 AM By: Kalman Shan DO Signed: 04/26/2021 5:03:29 PM By: Rhae Hammock RN Entered By: Kalman Shan on 04/26/2021 09:37:47 -------------------------------------------------------------------------------- SuperBill Details Patient Name: Date of Service: Larry Sickles RLES E. 04/26/2021 Medical Record Number: [id number] Patient Account Number: [id number] Date of Birth/Sex: Treating RN: 05-10-1946 (78 y.o. 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, left eye Facility Procedures CPT4 Code: [id number] Description: 44739 - CHEM CAUT GRANULATION TISS ICD-10 Diagnosis Description L97.828 Non-pressure chronic ulcer of other part of left  lower leg with other specified s E11.622 Type 2 diabetes mellitus with other skin ulcer Modifier: everity Quantity: 1 Physician Procedures : CPT4 Code Description Modifier [id number] - WC PHYS LEVEL 3 - EST PT ICD-10 Diagnosis Description L97.828 Non-pressure chronic ulcer of other part of left lower leg with other specified severity E11.622 Type 2 diabetes mellitus with other skin ulcer  I10 Essential (primary) hypertension 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

## 2021-05-02 ENCOUNTER — Encounter (HOSPITAL_BASED_OUTPATIENT_CLINIC_OR_DEPARTMENT_OTHER): Payer: Medicare PPO | Admitting: Physician Assistant

## 2021-05-02 ENCOUNTER — Other Ambulatory Visit: Payer: Self-pay

## 2021-05-02 DIAGNOSIS — I10 Essential (primary) hypertension: Secondary | ICD-10-CM | POA: Diagnosis not present

## 2021-05-02 DIAGNOSIS — L97828 Non-pressure chronic ulcer of other part of left lower leg with other specified severity: Secondary | ICD-10-CM | POA: Diagnosis not present

## 2021-05-02 DIAGNOSIS — E11622 Type 2 diabetes mellitus with other skin ulcer: Secondary | ICD-10-CM | POA: Diagnosis not present

## 2021-05-02 NOTE — Progress Notes (Signed)
ALGIS, LEHENBAUER ([id number]) Visit Report for 05/02/2021 Arrival Information Details Patient Name: Date of Service: Larry Sickles RLES E. 05/02/2021 8:30 A M Medical Record Number: [id number] Patient Account Number: [id number] Date of Birth/Sex: Treating RN: 15-Jul-1945 (75 y.o. [name] Primary Care Tsutomu Barfoot: Elyn Peers Other Clinician: Referring Hiliary Osorto: Treating Elizabet Schweppe/Extender: Katherine Roan in Treatment: 3 Visit Information History Since Last Visit Added or deleted any medications: No Patient Arrived: Ambulatory Any new allergies or adverse reactions: No Arrival Time: 08:48 Had a fall or experienced change in No Accompanied By: alone activities of daily living that may affect Transfer Assistance: None risk of falls: Patient Identification Verified: Yes Signs or symptoms of abuse/neglect since last visito No Secondary Verification Process Completed: Yes Hospitalized since last visit: No Patient Requires Transmission-Based Precautions: No Implantable device outside of the clinic excluding No Patient Has Alerts: Yes cellular tissue based products placed in the center Patient Alerts: R ABI: 1.2 TBI: 0.88 since last visit: L ABI: 1.22 TBI: 1.07 Has Dressing in Place as Prescribed: Yes Has Compression in Place as Prescribed: Yes Pain Present Now: No Electronic Signature(s) Signed: 05/02/2021 5:44:39 PM By: Levan Hurst RN, BSN Entered By: Levan Hurst on 05/02/2021 08:58:22 -------------------------------------------------------------------------------- Clinic Level of Care Assessment Details Patient Name: Date of Service: Larry Sickles RLES E. 05/02/2021 8:30 A M Medical Record Number: [id number] Patient Account Number: [id number] Date of Birth/Sex: Treating RN: Nov 02, 1945 (75 y.o. [name] Primary Care Shalicia Craghead: Elyn Peers Other Clinician: Referring Decoda Van: Treating Breyanna Valera/Extender: Katherine Roan  in Treatment: 3 Clinic Level of Care Assessment Items TOOL 4 Quantity Score X- 1 0 Use when only an EandM is performed on FOLLOW-UP visit ASSESSMENTS - Nursing Assessment / Reassessment X- 1 10 Reassessment of Co-morbidities (includes updates in patient status) X- 1 5 Reassessment of Adherence to Treatment Plan ASSESSMENTS - Wound and Skin A ssessment / Reassessment X - Simple Wound Assessment / Reassessment - one wound 1 5 []  - 0 Complex Wound Assessment / Reassessment - multiple wounds []  - 0 Dermatologic / Skin Assessment (not related to wound area) ASSESSMENTS - Focused Assessment []  - 0 Circumferential Edema Measurements - multi extremities []  - 0 Nutritional Assessment / Counseling / Intervention []  - 0 Lower Extremity Assessment (monofilament, tuning fork, pulses) []  - 0 Peripheral Arterial Disease Assessment (using hand held doppler) ASSESSMENTS - Ostomy and/or Continence Assessment and Care []  - 0 Incontinence Assessment and Management []  - 0 Ostomy Care Assessment and Management (repouching, etc.) PROCESS - Coordination of Care X - Simple Patient / Family Education for ongoing care 1 15 []  - 0 Complex (extensive) Patient / Family Education for ongoing care X- 1 10 Staff obtains Programmer, systems, Records, T Results / Process Orders est []  - 0 Staff telephones HHA, Nursing Homes / Clarify orders / etc []  - 0 Routine Transfer to another Facility (non-emergent condition) []  - 0 Routine Hospital Admission (non-emergent condition) []  - 0 New Admissions / Biomedical engineer / Ordering NPWT Apligraf, etc. , []  - 0 Emergency Hospital Admission (emergent condition) X- 1 10 Simple Discharge Coordination []  - 0 Complex (extensive) Discharge Coordination PROCESS - Special Needs []  - 0 Pediatric / Minor Patient Management []  - 0 Isolation Patient Management []  - 0 Hearing / Language / Visual special needs []  - 0 Assessment of Community assistance (transportation,  D/C planning, etc.) []  - 0 Additional assistance / Altered mentation []  - 0 Support Surface(s) Assessment (  bed, cushion, seat, etc.) INTERVENTIONS - Wound Cleansing / Measurement X - Simple Wound Cleansing - one wound 1 5 []  - 0 Complex Wound Cleansing - multiple wounds []  - 0 Wound Imaging (photographs - any number of wounds) []  - 0 Wound Tracing (instead of photographs) X- 1 5 Simple Wound Measurement - one wound []  - 0 Complex Wound Measurement - multiple wounds INTERVENTIONS - Wound Dressings []  - 0 Small Wound Dressing one or multiple wounds []  - 0 Medium Wound Dressing one or multiple wounds X- 1 20 Large Wound Dressing one or multiple wounds []  - 0 Application of Medications - topical []  - 0 Application of Medications - injection INTERVENTIONS - Miscellaneous []  - 0 External ear exam []  - 0 Specimen Collection (cultures, biopsies, blood, body fluids, etc.) []  - 0 Specimen(s) / Culture(s) sent or taken to Lab for analysis []  - 0 Patient Transfer (multiple staff / Civil Service fast streamer / Similar devices) []  - 0 Simple Staple / Suture removal (25 or less) []  - 0 Complex Staple / Suture removal (26 or more) []  - 0 Hypo / Hyperglycemic Management (close monitor of Blood Glucose) []  - 0 Ankle / Brachial Index (ABI) - do not check if billed separately X- 1 5 Vital Signs Has the patient been seen at the hospital within the last three years: Yes Total Score: 90 Level Of Care: New/Established - Level 3 Electronic Signature(s) Signed: 05/02/2021 5:44:39 PM By: Levan Hurst RN, BSN Entered By: Levan Hurst on 05/02/2021 08:59:35 -------------------------------------------------------------------------------- Encounter Discharge Information Details Patient Name: Date of Service: Larry Welch, Larry RLES E. 05/02/2021 8:30 A M Medical Record Number: [id number] Patient Account Number: [id number] Date of Birth/Sex: Treating RN: [birth year] (75 y.o. [name] Primary Care  Xaiden Fleig: Elyn Peers Other Clinician: Referring Kaylon Laroche: Treating Ahnika Hannibal/Extender: Katherine Roan in Treatment: 3 Encounter Discharge Information Items Discharge Condition: Stable Ambulatory Status: Ambulatory Discharge Destination: Home Transportation: Private Auto Accompanied By: alone Schedule Follow-up Appointment: Yes Clinical Summary of Care: Patient Declined Electronic Signature(s) Signed: 05/02/2021 5:44:39 PM By: Levan Hurst RN, BSN Entered By: Levan Hurst on 05/02/2021 09:00:04 -------------------------------------------------------------------------------- Wound Assessment Details Patient Name: Date of Service: Larry Sickles RLES E. 05/02/2021 8:30 A M Medical Record Number: [id number] Patient Account Number: [id number] Date of Birth/Sex: Treating RN: [birth year] (75 y.o. [name] Primary Care Jonita Hirota: Elyn Peers Other Clinician: Referring Hayven Fatima: Treating Patton Rabinovich/Extender: Verta Ellen Weeks in Treatment: 3 Wound Status Wound Number: 1 Primary Etiology: Trauma, Other Wound Location: Left, Anterior Lower Leg Wound Status: Open Wounding Event: Trauma Comorbid History: Hypertension Date Acquired: 02/08/2021 Weeks Of Treatment: 3 Clustered Wound: Yes Wound Measurements Length: (cm) 4.5 Width: (cm) 2 Depth: (cm) 0.8 Area: (cm) 7.069 Volume: (cm) 5.655 Wound Description Classification: Full Thickness Without Exposed Support Structu Wound Margin: Distinct, outline attached Exudate Amount: Medium Exudate Type: Serosanguineous Exudate Color: red, brown Foul Odor After Cleansing: Slough/Fibrino % Reduction in Area: 31.8% % Reduction in Volume: 54.5% Epithelialization: Small (1-33%) Tunneling: No Undermining: No res No Yes Wound Bed Granulation Amount: Large (67-100%) Exposed Structure Granulation Quality: Red, Pink, Hyper-granulation Fascia Exposed: No Necrotic Amount: Small (1-33%) Fat  Layer (Subcutaneous Tissue) Exposed: Yes Necrotic Quality: Adherent Slough Tendon Exposed: No Muscle Exposed: No Joint Exposed: No Bone Exposed: No Treatment Notes Wound #1 (Lower Leg) Wound Laterality: Left, Anterior Cleanser Soap and Water Discharge Instruction: May shower and wash wound with dial antibacterial soap and water prior to dressing change. Wound  Cleanser Discharge Instruction: Cleanse the wound with wound cleanser prior to applying a clean dressing using gauze sponges, not tissue or cotton balls. Peri-Wound Care Sween Lotion (Moisturizing lotion) Discharge Instruction: Apply moisturizing lotion as directed Topical Primary Dressing Hydrofera Blue Classic Foam, 4x4 in Discharge Instruction: Moisten with saline prior to applying to wound bed Secondary Dressing Woven Gauze Sponge, Non-Sterile 4x4 in Discharge Instruction: Apply over primary dressing as directed. ABD Pad, 5x9 Discharge Instruction: Apply over primary dressing as directed. Secured With 21M Medipore H Soft Cloth Surgical T ape, 4 x 10 (in/yd) Discharge Instruction: Secure with tape as directed. Compression Wrap Kerlix Roll 4.5x3.1 (in/yd) Discharge Instruction: Apply Kerlix and Coban compression as directed. Coban Self-Adherent Wrap 4x5 (in/yd) Discharge Instruction: Apply over Kerlix as directed. Compression Stockings Add-Ons Electronic Signature(s) Signed: 05/02/2021 5:44:39 PM By: Levan Hurst RN, BSN Entered By: Levan Hurst on 05/02/2021 08:59:08 -------------------------------------------------------------------------------- [place] Details Patient Name: Date of Service: Larry Welch, Larry RLES E. 05/02/2021 8:30 A M Medical Record Number: [id number] Patient Account Number: [id number] Date of Birth/Sex: Treating RN: 05-02-1946 (75 y.o. [name] Primary Care Mauricio Dahlen: Other Clinician: Elyn Peers Referring Tammatha Cobb: Treating Statia Burdick/Extender: Verta Ellen Weeks  in Treatment: 3 Vital Signs Time Taken: 08:48 Temperature (F): 98.7 Height (in): 72 Pulse (bpm): 63 Weight (lbs): 195 Respiratory Rate (breaths/min): 18 Body Mass Index (BMI): 26.4 Blood Pressure (mmHg): 149/83 Reference Range: 80 - 120 mg / dl Electronic Signature(s) Signed: 05/02/2021 5:44:39 PM By: Levan Hurst RN, BSN Entered By: Levan Hurst on 05/02/2021 08:58:38

## 2021-05-02 NOTE — Progress Notes (Signed)
EMMET, MESSER ([id number]) Visit Report for 05/02/2021 SuperBill Details Patient Name: Date of Service: Larry Welch 05/02/2021 Medical Record Number: [id number] Patient Account Number: [id number] Date of Birth/Sex: Treating RN: 1946/02/03 (75 y.o. 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

## 2021-05-07 DIAGNOSIS — H40013 Open angle with borderline findings, low risk, bilateral: Secondary | ICD-10-CM | POA: Diagnosis not present

## 2021-05-10 ENCOUNTER — Other Ambulatory Visit: Payer: Self-pay

## 2021-05-10 ENCOUNTER — Encounter (HOSPITAL_BASED_OUTPATIENT_CLINIC_OR_DEPARTMENT_OTHER): Payer: Medicare PPO | Attending: Internal Medicine | Admitting: Internal Medicine

## 2021-05-10 DIAGNOSIS — H20032 Secondary infectious iridocyclitis, left eye: Secondary | ICD-10-CM | POA: Insufficient documentation

## 2021-05-10 DIAGNOSIS — L97822 Non-pressure chronic ulcer of other part of left lower leg with fat layer exposed: Secondary | ICD-10-CM | POA: Diagnosis not present

## 2021-05-10 DIAGNOSIS — Z8546 Personal history of malignant neoplasm of prostate: Secondary | ICD-10-CM | POA: Diagnosis not present

## 2021-05-10 DIAGNOSIS — Z7984 Long term (current) use of oral hypoglycemic drugs: Secondary | ICD-10-CM | POA: Insufficient documentation

## 2021-05-10 DIAGNOSIS — I10 Essential (primary) hypertension: Secondary | ICD-10-CM | POA: Diagnosis not present

## 2021-05-10 DIAGNOSIS — E11622 Type 2 diabetes mellitus with other skin ulcer: Secondary | ICD-10-CM | POA: Insufficient documentation

## 2021-05-10 DIAGNOSIS — L97828 Non-pressure chronic ulcer of other part of left lower leg with other specified severity: Secondary | ICD-10-CM | POA: Diagnosis not present

## 2021-05-10 DIAGNOSIS — G4733 Obstructive sleep apnea (adult) (pediatric): Secondary | ICD-10-CM | POA: Diagnosis not present

## 2021-05-14 NOTE — Progress Notes (Signed)
[facility], RINGLER ([id number]) Visit Report for 05/10/2021 Chief Complaint Document Details Patient Name: Date of Service: Larry Welch RLES E. 05/10/2021 8:30 A M Medical Record Number: [id number] Patient Account Number: [id number] Date of Birth/Sex: Treating RN: Oct 25, 1945 (75 y.o. Larry Welch, Larry Welch Primary Care Provider: Elyn Peers Other Clinician: Referring Provider: Treating Provider/Extender: Arnette Schaumann Weeks in Treatment: 4 Information Obtained from: Patient Chief Complaint Left lower extremity wound Electronic Signature(s) Signed: 05/10/2021 9:39:23 AM By: Kalman Shan DO Entered By: Kalman Shan on 05/10/2021 09:34:50 -------------------------------------------------------------------------------- Debridement Details Patient Name: Date of Service: Larry Welch RLES E. 05/10/2021 8:30 A M Medical Record Number: [id number] Patient Account Number: [id number] Date of Birth/Sex: Treating RN: December 28, 1945 (87 y.o. Larry Welch Primary Care Provider: Elyn Peers Other Clinician: Referring Provider: Treating Provider/Extender: Arnette Schaumann Weeks in Treatment: 4 Debridement Performed for Assessment: Wound #1 Left,Anterior Lower Leg Performed By: Physician Kalman Shan, DO Debridement Type: Debridement Level of Consciousness (Pre-procedure): Awake and Alert Pre-procedure Verification/Time Out Yes - 09:07 Taken: Start Time: 09:07 T Area Debrided (L x W): otal 2 (cm) x 1 (cm) = 2 (cm) Tissue and other material debrided: Non-Viable, Slough, Subcutaneous, Slough Level: Skin/Subcutaneous Tissue Debridement Description: Excisional Instrument: Curette Bleeding: Minimum Hemostasis Achieved: Pressure End Time: 09:10 Procedural Pain: 0 Post Procedural Pain: 0 Response to Treatment: Procedure was tolerated well Level of Consciousness (Post- Awake and Alert procedure): Post Debridement Measurements of Total Wound Length: (cm)  3.4 Width: (cm) 2 Depth: (cm) 0.1 Volume: (cm) 0.534 Character of Wound/Ulcer Post Debridement: Improved Post Procedure Diagnosis Same as Pre-procedure Electronic Signature(s) Signed: 05/10/2021 9:39:23 AM By: Kalman Shan DO Signed: 05/14/2021 3:59:15 PM By: Levan Hurst RN, BSN Entered By: Levan Hurst on 05/10/2021 09:10:49 -------------------------------------------------------------------------------- HPI Details Patient Name: Date of Service: Larry Welch, Larry RLES E. 05/10/2021 8:30 A M Medical Record Number: [id number] Patient Account Number: [id number] Date of Birth/Sex: Treating RN: 04-09-1946 (49 y.o. Larry Welch Primary Care Provider: Elyn Peers Other Clinician: Referring Provider: Treating Provider/Extender: Arnette Schaumann Weeks in Treatment: 4 History of Present Illness HPI Description: Admission 04/06/2021 Mr. Larry Welch is a 75 year old male with a past medical history of type 2 diabetes on oral medications, essential hypertension and OSA who presents to the clinic for a 74-month history of nonhealing wound to his left lower extremity. He states that he developed a wound from a pressure washer in the beginning of September. He states that the wound had improved to the point that it almost closed and then became significantly worse in the past couple weeks. He visited the ED on 04/04/2021 for this issue and was started on doxycycline. He currently denies signs of infection. 11/3; patient presents for follow-up. He has been using Hydrofera Blue without issues. He has no issues or complaints today. He denies signs of infection. He does not have pain to the wound site. 11/10; patient presents for follow-up. He has no issues or complaints today. He tolerated the compression wrap well. He denies signs of infection. 11/17; patient presents for 1 week follow-up. He has tolerated the compression wrap well. He has no issues or complaints today and  denies signs of infection. 12/1; patient presents for follow-up. He has no issues or complaints today. He denies signs of infection. Electronic Signature(s) Signed: 05/10/2021 9:39:23 AM By: Kalman Shan DO Entered By: Kalman Shan on 05/10/2021 09:35:16 -------------------------------------------------------------------------------- Physical Exam Details Patient Name: Date of Service: Larry Welch, Larry RLES  E. 05/10/2021 8:30 A M Medical Record Number: [id number] Patient Account Number: [id number] Date of Birth/Sex: Treating RN: [birth year] (56 y.o. Larry Welch Primary Care Provider: Elyn Peers Other Clinician: Referring Provider: Treating Provider/Extender: Arnette Schaumann Weeks in Treatment: 4 Constitutional respirations regular, non-labored and within target range for patient.. Cardiovascular 2+ dorsalis pedis/posterior tibialis pulses. Psychiatric pleasant and cooperative. Notes Left lower extremity: Open wound to the anterior aspect with hyper granulated tissue and tunneling to the 3 o'clock position. Bogginess to the 3 o'clock position. No signs of infection. No drainage noted. Good edema control. Electronic Signature(s) Signed: 05/10/2021 9:39:23 AM By: Kalman Shan DO Entered By: Kalman Shan on 05/10/2021 09:36:29 -------------------------------------------------------------------------------- Physician Orders Details Patient Name: Date of Service: Larry Welch, Larry RLES E. 05/10/2021 8:30 A M Medical Record Number: [id number] Patient Account Number: [id number] Date of Birth/Sex: Treating RN: Jun 28, 1945 (67 y.o. 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 - Dr. Heber [place] Return A Bathing/ Shower/ Hygiene May shower with protection but do not get wound dressing(s) wet. Edema Control - Lymphedema / SCD / Other Elevate legs to the level of the heart or above for 30 minutes daily and/or when sitting, a frequency of: - throughout the day Avoid standing for long periods of time. Wound Treatment Wound #1 - Lower Leg Wound Laterality: Left, Anterior Cleanser: Soap and Water Every Other Day/15 Days Discharge Instructions: May shower and wash wound with dial antibacterial soap and water prior to dressing change. Cleanser: Wound Cleanser (Generic) Every Other Day/15 Days Discharge Instructions: Cleanse the wound with wound cleanser prior to applying a clean dressing using gauze sponges, not tissue or cotton balls. Peri-Wound Care: Sween Lotion (Moisturizing lotion) Every Other Day/15 Days Discharge Instructions: Apply moisturizing lotion as directed Topical: Gentamicin Every Other Day/15 Days Discharge Instructions: As directed by physician Prim Dressing: Hydrofera Blue Classic Foam, 4x4 in (Generic) Every Other Day/15 Days ary Discharge Instructions: Moisten with saline prior to applying to wound bed Secondary Dressing: Woven Gauze Sponge, Non-Sterile 4x4 in (Generic) Every Other Day/15 Days Discharge Instructions: Apply over primary dressing as directed. Secondary Dressing: ABD Pad, 5x9 (Generic) Every Other Day/15 Days Discharge Instructions: Apply over primary dressing as directed. Secured With: 88M Medipore H Soft Cloth Surgical T ape, 4 x 10 (in/yd) (Generic) Every Other Day/15 Days Discharge Instructions: Secure with tape as directed. Compression Wrap: Kerlix Roll 4.5x3.1 (in/yd) Every Other Day/15 Days Discharge Instructions: Apply  Kerlix and Coban compression as directed. Compression Wrap: Coban Self-Adherent Wrap 4x5 (in/yd) Every Other Day/15 Days Discharge Instructions: Apply over Kerlix as directed. Electronic Signature(s) Signed: 05/10/2021 9:39:23 AM By: Kalman Shan DO Entered By: Kalman Shan on 05/10/2021 09:36:49 -------------------------------------------------------------------------------- Problem List Details Patient Name: Date of Service: Larry Welch, Larry Welch 05/10/2021 8:30 A M Medical Record Number: [id number] Patient Account Number: [id number] Date of Birth/Sex: Treating RN: 1945-07-21 (45 y.o. 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 05/10/2021 8:30 A M Medical Record Number: [id number] Patient Account Number: [id number] Date of Birth/Sex: Treating RN: Oct 07, 1945 (36 y.o. Larry Welch Primary Care Provider: Elyn Peers Other Clinician: Referring Provider: Treating Provider/Extender: Arnette Schaumann Weeks in Treatment: 4 Subjective Chief Complaint Information obtained from Patient Left lower extremity wound History of  Present Illness (HPI) Admission 04/06/2021 Mr. Adriene Padula is a 75 year old male with a past medical history of type 2 diabetes on oral medications, essential hypertension and OSA who presents to the clinic for a 76-month history of nonhealing wound to his left lower extremity. He states that he developed a wound from a pressure washer in the beginning of September. He states that the wound had improved to the point that it almost closed and then became significantly worse in the past couple weeks. He visited the ED on 04/04/2021 for this issue and was started on doxycycline. He currently denies signs of infection. 11/3; patient presents for follow-up. He has been using Hydrofera Blue without issues. He has no issues or complaints today. He denies signs of infection. He does not have pain to the wound site. 11/10; patient presents for follow-up. He has no issues or complaints today. He tolerated the compression wrap well. He denies signs of infection. 11/17; patient presents for 1 week follow-up. He has tolerated the compression wrap well. He has no issues or complaints today and denies signs of infection. 12/1; patient presents for follow-up. He has no issues or complaints today. He denies signs of infection. Patient History Information obtained from Patient, Chart. Family History Unknown History. Social History Never smoker, Marital Status - Married, Alcohol Use - Rarely, Drug Use - No History, Caffeine Use - Rarely. Medical History Cardiovascular Patient has history of Hypertension Denies history of Angina, Arrhythmia, Congestive Heart Failure, Coronary Artery Disease, Deep Vein Thrombosis, Hypotension, Myocardial Infarction, Peripheral Arterial Disease, Peripheral Venous Disease, Phlebitis, Vasculitis Integumentary (Skin) Denies history of History of Burn Hospitalization/Surgery History - back surgery/foot surgery/ radiation plant sreed. Medical A Surgical History  Notes nd Cardiovascular hyperlipdemia Endocrine pre0diabetic Oncologic hx of prostate ca Objective Constitutional respirations regular, non-labored and within target range for patient.. Vitals Time Taken: 8:50 AM, Height: 72 in, Weight: 195 lbs, BMI: 26.4, Temperature: 97.9 F, Pulse: 57 bpm, Respiratory Rate: 16 breaths/min, Blood Pressure: 189/89 mmHg. Cardiovascular 2+ dorsalis pedis/posterior tibialis pulses. Psychiatric pleasant and cooperative. General Notes: Left lower extremity: Open wound to the anterior aspect with hyper granulated tissue and tunneling to the 3 o'clock position. Bogginess to the 3 o'clock position. No signs of infection. No drainage noted. Good edema control. Integumentary (Hair, Skin) Wound #1 status is Open. Original cause of wound was Trauma. The date acquired was: 02/08/2021. The wound has been in treatment 4 weeks. The wound is located on the Left,Anterior Lower Leg. The wound measures 3.4cm length x 2cm width x 0.1cm depth; 5.341cm^2 area and 0.534cm^3 volume. There is Fat Layer (Subcutaneous Tissue) exposed. There is no undermining noted, however, there is tunneling at 3:00 with a maximum distance of 2.6cm. There  is a medium amount of serosanguineous drainage noted. The wound margin is distinct with the outline attached to the wound base. There is large (67-100%) red, pink granulation within the wound bed. There is a small (1-33%) amount of necrotic tissue within the wound bed including Adherent Slough. Assessment Active Problems ICD-10 Non-pressure chronic ulcer of other part of left lower leg with other specified severity Type 2 diabetes mellitus with other skin ulcer Essential (primary) hypertension Secondary infectious iridocyclitis, left eye Overall patient's wound has shown improvement in size and appearance since last clinic visit. There is still a tunnel at the 3 o'clock position. This is a bit boggy and I debrided nonviable tissue. We will  add gentamicin and packed this with the Hydrofera Blue. T the surface I used silver nitrate to the hyper o granulated areas. I recommended continuing Hydrofera Blue to this. No obvious signs of infection on exam. Procedures Wound #1 Pre-procedure diagnosis of Wound #1 is a Trauma, Other located on the Left,Anterior Lower Leg . There was a Excisional Skin/Subcutaneous Tissue Debridement with a total area of 2 sq cm performed by Kalman Shan, DO. With the following instrument(s): Curette to remove Non-Viable tissue/material. Material removed includes Subcutaneous Tissue and Slough and. No specimens were taken. A time out was conducted at 09:07, prior to the start of the procedure. A Minimum amount of bleeding was controlled with Pressure. The procedure was tolerated well with a pain level of 0 throughout and a pain level of 0 following the procedure. Post Debridement Measurements: 3.4cm length x 2cm width x 0.1cm depth; 0.534cm^3 volume. Character of Wound/Ulcer Post Debridement is improved. Post procedure Diagnosis Wound #1: Same as Pre-Procedure Plan Follow-up Appointments: Return Appointment in 1 week. - Dr. Heber [place] Bathing/ Shower/ Hygiene: May shower with protection but do not get wound dressing(s) wet. Edema Control - Lymphedema / SCD / Other: Elevate legs to the level of the heart or above for 30 minutes daily and/or when sitting, a frequency of: - throughout the day Avoid standing for long periods of time. WOUND #1: - Lower Leg Wound Laterality: Left, Anterior Cleanser: Soap and Water Every Other Day/15 Days Discharge Instructions: May shower and wash wound with dial antibacterial soap and water prior to dressing change. Cleanser: Wound Cleanser (Generic) Every Other Day/15 Days Discharge Instructions: Cleanse the wound with wound cleanser prior to applying a clean dressing using gauze sponges, not tissue or cotton balls. Peri-Wound Care: Sween Lotion (Moisturizing lotion) Every  Other Day/15 Days Discharge Instructions: Apply moisturizing lotion as directed Topical: Gentamicin Every Other Day/15 Days Discharge Instructions: As directed by physician Prim Dressing: Hydrofera Blue Classic Foam, 4x4 in (Generic) Every Other Day/15 Days ary Discharge Instructions: Moisten with saline prior to applying to wound bed Secondary Dressing: Woven Gauze Sponge, Non-Sterile 4x4 in (Generic) Every Other Day/15 Days Discharge Instructions: Apply over primary dressing as directed. Secondary Dressing: ABD Pad, 5x9 (Generic) Every Other Day/15 Days Discharge Instructions: Apply over primary dressing as directed. Secured With: 68M Medipore H Soft Cloth Surgical T ape, 4 x 10 (in/yd) (Generic) Every Other Day/15 Days Discharge Instructions: Secure with tape as directed. Com pression Wrap: Kerlix Roll 4.5x3.1 (in/yd) Every Other Day/15 Days Discharge Instructions: Apply Kerlix and Coban compression as directed. Com pression Wrap: Coban Self-Adherent Wrap 4x5 (in/yd) Every Other Day/15 Days Discharge Instructions: Apply over Kerlix as directed. 1. Hydrofera Blue and gentamicin 2. Kerlix/Coban 3. In office sharp debridement 4. Silver nitrate 5. Follow-up in 1 week Electronic Signature(s) Signed: 05/10/2021 9:39:23 AM  By: Kalman Shan DO Entered By: Kalman Shan on 05/10/2021 09:38:19 -------------------------------------------------------------------------------- HxROS Details Patient Name: Date of Service: Larry Welch, [place] 05/10/2021 8:30 A M Medical Record Number: [id number] Patient Account Number: [id number] Date of Birth/Sex: Treating RN: 1945/12/31 (58 y.o. Larry Welch Primary Care Provider: Elyn Peers Other Clinician: Referring Provider: Treating Provider/Extender: Arnette Schaumann Weeks in Treatment: 4 Information Obtained From Patient Chart Cardiovascular Medical History: Positive for: Hypertension Negative for: Angina;  Arrhythmia; Congestive Heart Failure; Coronary Artery Disease; Deep Vein Thrombosis; Hypotension; Myocardial Infarction; Peripheral Arterial Disease; Peripheral Venous Disease; Phlebitis; Vasculitis Past Medical History Notes: hyperlipdemia Endocrine Medical History: Past Medical History Notes: pre0diabetic Integumentary (Skin) Medical History: Negative for: History of Burn Oncologic Medical History: Past Medical History Notes: hx of prostate ca Immunizations Pneumococcal Vaccine: Received Pneumococcal Vaccination: No Implantable Devices None Hospitalization / Surgery History Type of Hospitalization/Surgery back surgery/foot surgery/ radiation plant sreed Family and Social History Unknown History: Yes; Never smoker; Marital Status - Married; Alcohol Use: Rarely; Drug Use: No History; Caffeine Use: Rarely; Financial Concerns: No; Food, Clothing or Shelter Needs: No; Support System Lacking: No; Transportation Concerns: No Electronic Signature(s) Signed: 05/10/2021 9:39:23 AM By: Kalman Shan DO Signed: 05/10/2021 5:20:51 PM By: Rhae Hammock RN Entered By: Kalman Shan on 05/10/2021 09:35:27 -------------------------------------------------------------------------------- SuperBill Details Patient Name: Date of Service: Larry Welch RLES E. 05/10/2021 Medical Record Number: [id number] Patient Account Number: [id number] Date of Birth/Sex: Treating RN: January 12, 1946 (75 y.o. 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, left eye Facility Procedures CPT4 Code:  [id number] Description: 41660 - DEB SUBQ TISSUE 20 SQ CM/< ICD-10 Diagnosis Description L97.828 Non-pressure chronic ulcer of other part of left lower leg with other specified E11.622 Type 2 diabetes mellitus with other skin ulcer Modifier: severity Quantity: 1 Physician Procedures : CPT4 Code Description Modifier [id number] - WC PHYS SUBQ TISS 20 SQ CM ICD-10 Diagnosis Description L97.828 Non-pressure chronic ulcer of other part of left lower leg with other specified severity E11.622 Type 2 diabetes mellitus with other skin  ulcer Quantity: 1 Electronic Signature(s) Signed: 05/10/2021 9:39:23 AM By: Kalman Shan DO Entered By: Kalman Shan on 05/10/2021 09:38:42

## 2021-05-14 NOTE — Progress Notes (Signed)
DAYMEIN, NUNNERY ([id number]) Visit Report for 05/10/2021 Arrival Information Details Patient Name: Date of Service: [name] 05/10/2021 8:30 A M Medical Record Number: [id number] Patient Account Number: [id number] Date of Birth/Sex: Treating RN: September 07, 1945 (75 y.o. [name] Primary Care Lunabella Badgett: Elyn Peers Other Clinician: Referring Eliana Lueth: Treating Adelaide Pfefferkorn/Extender: Arnette Schaumann Weeks in Treatment: 4 Visit Information History Since Last Visit Added or deleted any medications: No Patient Arrived: Ambulatory Any new allergies or adverse reactions: No Arrival Time: 08:50 Had a fall or experienced change in No Accompanied By: alone activities of daily living that may affect Transfer Assistance: None risk of falls: Patient Identification Verified: Yes Signs or symptoms of abuse/neglect since last visito No Secondary Verification Process Completed: Yes Hospitalized since last visit: No Patient Requires Transmission-Based Precautions: No Implantable device outside of the clinic excluding No Patient Has Alerts: Yes cellular tissue based products placed in the center Patient Alerts: R ABI: 1.2 TBI: 0.88 since last visit: L ABI: 1.22 TBI: 1.07 Has Dressing in Place as Prescribed: Yes Has Compression in Place as Prescribed: Yes Pain Present Now: No Electronic Signature(s) Signed: 05/14/2021 3:59:15 PM By: Levan Hurst RN, BSN Entered By: Levan Hurst on 05/10/2021 08:51:07 -------------------------------------------------------------------------------- Encounter Discharge Information Details Patient Name: Date of Service: [name], [name] 05/10/2021 8:30 A M Medical Record Number: [id number] Patient Account Number: [id number] Date of Birth/Sex: Treating RN: 12/14/1945 (75 y.o. [name] Primary Care Jernie Schutt: Elyn Peers Other Clinician: Referring Donnalee Cellucci: Treating Ahnna Dungan/Extender: Arnette Schaumann Weeks in  Treatment: 4 Encounter Discharge Information Items Post Procedure Vitals Discharge Condition: Stable Temperature (F): 97.9 Ambulatory Status: Ambulatory Pulse (bpm): 57 Discharge Destination: Home Respiratory Rate (breaths/min): 16 Transportation: Private Auto Blood Pressure (mmHg): 189/89 Accompanied By: alone Schedule Follow-up Appointment: Yes Clinical Summary of Care: Patient Declined Electronic Signature(s) Signed: 05/14/2021 3:59:15 PM By: Levan Hurst RN, BSN Entered By: Levan Hurst on 05/10/2021 13:24:30 -------------------------------------------------------------------------------- Lower Extremity Assessment Details Patient Name: Date of Service: [name] 05/10/2021 8:30 A M Medical Record Number: [id number] Patient Account Number: [id number] Date of Birth/Sex: Treating RN: 1946/01/04 (75 y.o. [name] Primary Care Conlan Miceli: Elyn Peers Other Clinician: Referring Janmarie Smoot: Treating Beverlie Kurihara/Extender: Arnette Schaumann Weeks in Treatment: 4 Edema Assessment Assessed: [Left: No] [Right: No] Edema: [Left: Ye] [Right: s] Calf Left: Right: Point of Measurement: 41 cm From Medial Instep 37.5 cm Ankle Left: Right: Point of Measurement: 8 cm From Medial Instep 24 cm Vascular Assessment Pulses: Dorsalis Pedis Palpable: [Left:Yes] Electronic Signature(s) Signed: 05/14/2021 3:59:15 PM By: Levan Hurst RN, BSN Entered By: Levan Hurst on 05/10/2021 08:52:55 -------------------------------------------------------------------------------- Multi Wound Chart Details Patient Name: Date of Service: [name] 05/10/2021 8:30 A M Medical Record Number: [id number] Patient Account Number: [id number] Date of Birth/Sex: Treating RN: 1946/01/29 (75 y.o. [name], [name] Primary Care William Schake: Elyn Peers Other Clinician: Referring Iisha Soyars: Treating Navaya Wiatrek/Extender: Arnette Schaumann Weeks in Treatment:  4 Vital Signs Height(in): 72 Pulse(bpm): 57 Weight(lbs): 195 Blood Pressure(mmHg): 189/89 Body Mass Index(BMI): 26 Temperature(F): 97.9 Respiratory Rate(breaths/min): 16 Photos: [N/A:N/A] Left, Anterior Lower Leg N/A N/A Wound Location: Trauma N/A N/A Wounding Event: Trauma, Other N/A N/A Primary Etiology: Hypertension N/A N/A Comorbid History: 02/08/2021 N/A N/A Date Acquired: 4 N/A N/A Weeks of Treatment: Open N/A N/A Wound Status: Yes N/A N/A Clustered Wound: 3.4x2x0.1 N/A N/A Measurements L x W x D (cm) 5.341 N/A N/A A (  cm) : rea 0.534 N/A N/A Volume (cm) : 48.50% N/A N/A % Reduction in A rea: 95.70% N/A N/A % Reduction in Volume: 3 Position 1 (o'clock): 2.6 Maximum Distance 1 (cm): Yes N/A N/A Tunneling: Full Thickness Without Exposed N/A N/A Classification: Support Structures Medium N/A N/A Exudate A mount: Serosanguineous N/A N/A Exudate Type: red, brown N/A N/A Exudate Color: Distinct, outline attached N/A N/A Wound Margin: Large (67-100%) N/A N/A Granulation A mount: Red, Pink N/A N/A Granulation Quality: Small (1-33%) N/A N/A Necrotic A mount: Fat Layer (Subcutaneous Tissue): Yes N/A N/A Exposed Structures: Fascia: No Tendon: No Muscle: No Joint: No Bone: No Small (1-33%) N/A N/A Epithelialization: Debridement - Excisional N/A N/A Debridement: Pre-procedure Verification/Time Out 09:07 N/A N/A Taken: Subcutaneous, Slough N/A N/A Tissue Debrided: Skin/Subcutaneous Tissue N/A N/A Level: 2 N/A N/A Debridement A (sq cm): rea Curette N/A N/A Instrument: Minimum N/A N/A Bleeding: Pressure N/A N/A Hemostasis A chieved: 0 N/A N/A Procedural Pain: 0 N/A N/A Post Procedural Pain: Procedure was tolerated well N/A N/A Debridement Treatment Response: 3.4x2x0.1 N/A N/A Post Debridement Measurements L x W x D (cm) 0.534 N/A N/A Post Debridement Volume: (cm) Debridement N/A N/A Procedures Performed: Treatment  Notes Electronic Signature(s) Signed: 05/10/2021 9:39:23 AM By: Kalman Shan DO Signed: 05/10/2021 5:20:51 PM By: Rhae Hammock RN Entered By: Kalman Shan on 05/10/2021 09:34:30 -------------------------------------------------------------------------------- Multi-Disciplinary Care Plan Details Patient Name: Date of Service: [name], [name] 05/10/2021 8:30 A M Medical Record Number: [id number] Patient Account Number: [id number] Date of Birth/Sex: Treating RN: 1945/08/19 (75 y.o. [name] Primary Care Lakenya Riendeau: Elyn Peers Other Clinician: Referring Eugean Arnott: Treating Kayleana Waites/Extender: Arnette Schaumann Weeks in Treatment: 4 Active Inactive Wound/Skin Impairment Nursing Diagnoses: Impaired tissue integrity Knowledge deficit related to ulceration/compromised skin integrity Goals: Patient/caregiver will verbalize understanding of skin care regimen Date Initiated: 04/06/2021 Target Resolution Date: 06/08/2021 Goal Status: Active Ulcer/skin breakdown will have a volume reduction of 30% by week 4 Date Initiated: 04/06/2021 Date Inactivated: 05/10/2021 Target Resolution Date: 05/10/2021 Goal Status: Met Ulcer/skin breakdown will have a volume reduction of 50% by week 8 Date Initiated: 04/06/2021 Target Resolution Date: 06/08/2021 Goal Status: Active Interventions: Assess patient/caregiver ability to obtain necessary supplies Assess patient/caregiver ability to perform ulcer/skin care regimen upon admission and as needed Assess ulceration(s) every visit Notes: Electronic Signature(s) Signed: 05/14/2021 3:59:15 PM By: Levan Hurst RN, BSN Entered By: Levan Hurst on 05/10/2021 09:03:06 -------------------------------------------------------------------------------- Pain Assessment Details Patient Name: Date of Service: [name] 05/10/2021 8:30 A M Medical Record Number: [id number] Patient Account Number: [id number] Date of  Birth/Sex: Treating RN: [birth year] (89 y.o. [name] Primary Care Gale Hulse: Elyn Peers Other Clinician: Referring Efrat Zuidema: Treating Bonnie Overdorf/Extender: Arnette Schaumann Weeks in Treatment: 4 Active Problems Location of Pain Severity and Description of Pain Patient Has Paino No Site Locations Pain Management and Medication Current Pain Management: Electronic Signature(s) Signed: 05/14/2021 3:59:15 PM By: Levan Hurst RN, BSN Entered By: Levan Hurst on 05/10/2021 08:52:14 -------------------------------------------------------------------------------- Patient/Caregiver Education Details Patient Name: Date of Service: Orvan July 12/1/2022andnbsp8:30 A M Medical Record Number: [id number] Patient Account Number: [id number] Date of Birth/Gender: Treating RN: 29-Apr-1946 (54 y.o. [name] Primary Care Physician: Elyn Peers Other Clinician: Referring Physician: Treating Physician/Extender: Jeannine Boga in Treatment: 4 Education Assessment Education Provided To: Patient Education Topics Provided Wound/Skin Impairment: Methods: Explain/Verbal Responses: State content correctly Electronic Signature(s) Signed: 05/14/2021 3:59:15 PM By: Levan Hurst  RN, BSN Entered By: Levan Hurst on 05/10/2021 09:03:25 -------------------------------------------------------------------------------- Wound Assessment Details Patient Name: Date of Service: [name] 05/10/2021 8:30 A M Medical Record Number: [id number] Patient Account Number: [id number] Date of Birth/Sex: Treating RN: April 26, 1946 (75 y.o. [name] Primary Care Michella Detjen: Elyn Peers Other Clinician: Referring Kenlynn Houde: Treating Quirino Kakos/Extender: Arnette Schaumann Weeks in Treatment: 4 Wound Status Wound Number: 1 Primary Etiology: Trauma, Other Wound Location: Left, Anterior Lower Leg Wound Status: Open Wounding  Event: Trauma Comorbid History: Hypertension Date Acquired: 02/08/2021 Weeks Of Treatment: 4 Clustered Wound: Yes Photos Wound Measurements Length: (cm) 3.4 Width: (cm) 2 Depth: (cm) 0.1 Area: (cm) 5.341 Volume: (cm) 0.534 % Reduction in Area: 48.5% % Reduction in Volume: 95.7% Epithelialization: Small (1-33%) Tunneling: Yes Position (o'clock): 3 Maximum Distance: (cm) 2.6 Undermining: No Wound Description Classification: Full Thickness Without Exposed Support Structures Wound Margin: Distinct, outline attached Exudate Amount: Medium Exudate Type: Serosanguineous Exudate Color: red, brown Foul Odor After Cleansing: No Slough/Fibrino Yes Wound Bed Granulation Amount: Large (67-100%) Exposed Structure Granulation Quality: Red, Pink Fascia Exposed: No Necrotic Amount: Small (1-33%) Fat Layer (Subcutaneous Tissue) Exposed: Yes Necrotic Quality: Adherent Slough Tendon Exposed: No Muscle Exposed: No Joint Exposed: No Bone Exposed: No Treatment Notes Wound #1 (Lower Leg) Wound Laterality: Left, Anterior Cleanser Soap and Water Discharge Instruction: May shower and wash wound with dial antibacterial soap and water prior to dressing change. Wound Cleanser Discharge Instruction: Cleanse the wound with wound cleanser prior to applying a clean dressing using gauze sponges, not tissue or cotton balls. Peri-Wound Care Sween Lotion (Moisturizing lotion) Discharge Instruction: Apply moisturizing lotion as directed Topical Gentamicin Discharge Instruction: As directed by physician Primary Dressing Hydrofera Blue Classic Foam, 4x4 in Discharge Instruction: Moisten with saline prior to applying to wound bed Secondary Dressing Woven Gauze Sponge, Non-Sterile 4x4 in Discharge Instruction: Apply over primary dressing as directed. ABD Pad, 5x9 Discharge Instruction: Apply over primary dressing as directed. Secured With 47M Medipore H Soft Cloth Surgical T ape, 4 x 10  (in/yd) Discharge Instruction: Secure with tape as directed. Compression Wrap Kerlix Roll 4.5x3.1 (in/yd) Discharge Instruction: Apply Kerlix and Coban compression as directed. Coban Self-Adherent Wrap 4x5 (in/yd) Discharge Instruction: Apply over Kerlix as directed. Compression Stockings Add-Ons Electronic Signature(s) Signed: 05/14/2021 3:59:15 PM By: Levan Hurst RN, BSN Entered By: Levan Hurst on 05/10/2021 08:59:19 -------------------------------------------------------------------------------- Vitals Details Patient Name: Date of Service: [name], [name] 05/10/2021 8:30 A M Medical Record Number: [id number] Patient Account Number: [id number] Date of Birth/Sex: Treating RN: 06/30/1945 (75 y.o. [name] Primary Care Tell Rozelle: Elyn Peers Other Clinician: Referring Asante Ritacco: Treating Mida Cory/Extender: Arnette Schaumann Weeks in Treatment: 4 Vital Signs Time Taken: 08:50 Temperature (F): 97.9 Height (in): 72 Pulse (bpm): 57 Weight (lbs): 195 Respiratory Rate (breaths/min): 16 Body Mass Index (BMI): 26.4 Blood Pressure (mmHg): 189/89 Reference Range: 80 - 120 mg / dl Electronic Signature(s) Signed: 05/14/2021 3:59:15 PM By: Levan Hurst RN, BSN Entered By: Levan Hurst on 05/10/2021 08:51:22

## 2021-05-17 ENCOUNTER — Other Ambulatory Visit: Payer: Self-pay

## 2021-05-17 ENCOUNTER — Encounter (HOSPITAL_BASED_OUTPATIENT_CLINIC_OR_DEPARTMENT_OTHER): Payer: Medicare PPO | Admitting: Internal Medicine

## 2021-05-17 DIAGNOSIS — L97828 Non-pressure chronic ulcer of other part of left lower leg with other specified severity: Secondary | ICD-10-CM

## 2021-05-17 DIAGNOSIS — H20032 Secondary infectious iridocyclitis, left eye: Secondary | ICD-10-CM | POA: Diagnosis not present

## 2021-05-17 DIAGNOSIS — I10 Essential (primary) hypertension: Secondary | ICD-10-CM | POA: Diagnosis not present

## 2021-05-17 DIAGNOSIS — G4733 Obstructive sleep apnea (adult) (pediatric): Secondary | ICD-10-CM | POA: Diagnosis not present

## 2021-05-17 DIAGNOSIS — L97822 Non-pressure chronic ulcer of other part of left lower leg with fat layer exposed: Secondary | ICD-10-CM | POA: Diagnosis not present

## 2021-05-17 DIAGNOSIS — Z8546 Personal history of malignant neoplasm of prostate: Secondary | ICD-10-CM | POA: Diagnosis not present

## 2021-05-17 DIAGNOSIS — Z7984 Long term (current) use of oral hypoglycemic drugs: Secondary | ICD-10-CM | POA: Diagnosis not present

## 2021-05-17 DIAGNOSIS — E11622 Type 2 diabetes mellitus with other skin ulcer: Secondary | ICD-10-CM

## 2021-05-17 NOTE — Progress Notes (Signed)
RAMIREZ, FULLBRIGHT ([id number]) Visit Report for 05/17/2021 Arrival Information Details Patient Name: Date of Service: Larry Welch RLES E. 05/17/2021 8:30 A M Medical Record Number: [id number] Patient Account Number: [id number] Date of Birth/Sex: Treating RN: 12/31/1945 (75 y.o. [name] Primary Care Laretha Luepke: Elyn Peers Other Clinician: Referring Lylla Eifler: Treating Laini Urick/Extender: Arnette Schaumann Weeks in Treatment: 5 Visit Information History Since Last Visit Added or deleted any medications: No Patient Arrived: Ambulatory Any new allergies or adverse reactions: No Arrival Time: 09:04 Had a fall or experienced change in No Transfer Assistance: None activities of daily living that may affect Patient Identification Verified: Yes risk of falls: Secondary Verification Process Completed: Yes Signs or symptoms of abuse/neglect since last visito No Patient Requires Transmission-Based Precautions: No Hospitalized since last visit: No Patient Has Alerts: Yes Implantable device outside of the clinic excluding No Patient Alerts: R ABI: 1.2 TBI: 0.88 cellular tissue based products placed in the center L ABI: 1.22 TBI: 1.07 since last visit: Has Dressing in Place as Prescribed: Yes Has Compression in Place as Prescribed: Yes Pain Present Now: No Electronic Signature(s) Signed: 05/17/2021 5:07:57 PM By: Lorrin Jackson Entered By: Lorrin Jackson on 05/17/2021 09:05:13 -------------------------------------------------------------------------------- Encounter Discharge Information Details Patient Name: Date of Service: Larry Welch, [name] 05/17/2021 8:30 A M Medical Record Number: [id number] Patient Account Number: [id number] Date of Birth/Sex: Treating RN: 10-Jan-1946 (75 y.o. [name] Primary Care Yanni Ruberg: Elyn Peers Other Clinician: Referring Harel Repetto: Treating Cruz Devilla/Extender: Jeannine Boga in Treatment: 5 Encounter  Discharge Information Items Discharge Condition: Stable Ambulatory Status: Ambulatory Discharge Destination: Home Transportation: Private Auto Schedule Follow-up Appointment: Yes Clinical Summary of Care: Provided on 05/17/2021 Form Type Recipient Paper Patient Patient Electronic Signature(s) Signed: 05/17/2021 5:07:57 PM By: Lorrin Jackson Entered By: Lorrin Jackson on 05/17/2021 09:25:12 -------------------------------------------------------------------------------- Lower Extremity Assessment Details Patient Name: Date of Service: Larry Welch RLES E. 05/17/2021 8:30 A M Medical Record Number: [id number] Patient Account Number: [id number] Date of Birth/Sex: Treating RN: 1945/10/24 (75 y.o. [name] Primary Care Quintessa Simmerman: Elyn Peers Other Clinician: Referring Jden Want: Treating Keziah Avis/Extender: Arnette Schaumann Weeks in Treatment: 5 Edema Assessment Assessed: [Left: Yes] [Right: No] Edema: [Left: Ye] [Right: s] Calf Left: Right: Point of Measurement: 41 cm From Medial Instep 38 cm Ankle Left: Right: Point of Measurement: 8 cm From Medial Instep 24 cm Vascular Assessment Pulses: Dorsalis Pedis Palpable: [Left:Yes] Electronic Signature(s) Signed: 05/17/2021 5:07:57 PM By: Lorrin Jackson Entered By: Lorrin Jackson on 05/17/2021 09:09:25 -------------------------------------------------------------------------------- Multi Wound Chart Details Patient Name: Date of Service: Larry Welch, [name] 05/17/2021 8:30 A M Medical Record Number: [id number] Patient Account Number: [id number] Date of Birth/Sex: Treating RN: 1945-11-27 (89 y.o. M) Primary Care Linwood Gullikson: Elyn Peers Other Clinician: Referring Orey Moure: Treating Ariyah Sedlack/Extender: Arnette Schaumann Weeks in Treatment: 5 Vital Signs Height(in): 72 Pulse(bpm): 55 Weight(lbs): 195 Blood Pressure(mmHg): 145/81 Body Mass Index(BMI): 26 Temperature(F): 98.3 Respiratory  Rate(breaths/min): 18 Photos: [1:No Photos Left, Anterior Lower Leg] [N/A:N/A N/A] Wound Location: [1:Trauma] [N/A:N/A] Wounding Event: [1:Trauma, Other] [N/A:N/A] Primary Etiology: [1:Hypertension] [N/A:N/A] Comorbid History: [1:02/08/2021] [N/A:N/A] Date Acquired: [1:5] [N/A:N/A] Weeks of Treatment: [1:Open] [N/A:N/A] Wound Status: [1:Yes] [N/A:N/A] Clustered Wound: [1:3.1x2.5x0.4] [N/A:N/A] Measurements L x W x D (cm) [1:6.087] [N/A:N/A] A (cm) : rea [1:2.435] [N/A:N/A] Volume (cm) : [1:41.30%] [N/A:N/A] % Reduction in A rea: [1:80.40%] [N/A:N/A] % Reduction in Volume: [1:3] Position 1 (o'clock): [1:2.5] Maximum Distance 1 (cm): [1:Yes] [N/A:N/A]  Tunneling: [1:Full Thickness Without Exposed] [N/A:N/A] Classification: [1:Support Structures Medium] [N/A:N/A] Exudate Amount: [1:Serosanguineous] [N/A:N/A] Exudate Type: [1:red, brown] [N/A:N/A] Exudate Color: [1:Distinct, outline attached] [N/A:N/A] Wound Margin: [1:Large (67-100%)] [N/A:N/A] Granulation Amount: [1:Red, Pink] [N/A:N/A] Granulation Quality: [1:Small (1-33%)] [N/A:N/A] Necrotic Amount: [1:Fat Layer (Subcutaneous Tissue): Yes N/A] Exposed Structures: [1:Fascia: No Tendon: No Muscle: No Joint: No Bone: No Small (1-33%)] [N/A:N/A] Epithelialization: [1:Chemical Cauterization] [N/A:N/A] Treatment Notes Wound #1 (Lower Leg) Wound Laterality: Left, Anterior Cleanser Soap and Water Discharge Instruction: May shower and wash wound with dial antibacterial soap and water prior to dressing change. Wound Cleanser Discharge Instruction: Cleanse the wound with wound cleanser prior to applying a clean dressing using gauze sponges, not tissue or cotton balls. Peri-Wound Care Sween Lotion (Moisturizing lotion) Discharge Instruction: Apply moisturizing lotion as directed Topical Gentamicin Discharge Instruction: As directed by physician Primary Dressing Hydrofera Blue Classic Foam, 4x4 in Discharge Instruction: Moisten with  saline prior to applying to wound bed Secondary Dressing Woven Gauze Sponge, Non-Sterile 4x4 in Discharge Instruction: Apply over primary dressing as directed. ABD Pad, 5x9 Discharge Instruction: Apply over primary dressing as directed. Secured With Compression Wrap Kerlix Roll 4.5x3.1 (in/yd) Discharge Instruction: Apply Kerlix and Coban compression as directed. Coban Self-Adherent Wrap 4x5 (in/yd) Discharge Instruction: Apply over Kerlix as directed. Compression Stockings Add-Ons Electronic Signature(s) Signed: 05/17/2021 9:34:51 AM By: Kalman Shan DO Entered By: Kalman Shan on 05/17/2021 09:28:38 -------------------------------------------------------------------------------- Multi-Disciplinary Care Plan Details Patient Name: Date of Service: Larry Welch RLES E. 05/17/2021 8:30 A M Medical Record Number: [id number] Patient Account Number: [id number] Date of Birth/Sex: Treating RN: 10-14-1945 (75 y.o. [name] Primary Care Edrei Norgaard: Elyn Peers Other Clinician: Referring Kyshawn Teal: Treating Londyn Hotard/Extender: Arnette Schaumann Weeks in Treatment: 5 Active Inactive Wound/Skin Impairment Nursing Diagnoses: Impaired tissue integrity Knowledge deficit related to ulceration/compromised skin integrity Goals: Patient/caregiver will verbalize understanding of skin care regimen Date Initiated: 04/06/2021 Target Resolution Date: 06/08/2021 Goal Status: Active Ulcer/skin breakdown will have a volume reduction of 30% by week 4 Date Initiated: 04/06/2021 Date Inactivated: 05/10/2021 Target Resolution Date: 05/10/2021 Goal Status: Met Ulcer/skin breakdown will have a volume reduction of 50% by week 8 Date Initiated: 04/06/2021 Target Resolution Date: 06/08/2021 Goal Status: Active Interventions: Assess patient/caregiver ability to obtain necessary supplies Assess patient/caregiver ability to perform ulcer/skin care regimen upon admission and as  needed Assess ulceration(s) every visit Notes: Electronic Signature(s) Signed: 05/17/2021 5:07:57 PM By: Lorrin Jackson Entered By: Lorrin Jackson on 05/17/2021 09:16:30 -------------------------------------------------------------------------------- Pain Assessment Details Patient Name: Date of Service: Larry Welch RLES E. 05/17/2021 8:30 A M Medical Record Number: [id number] Patient Account Number: [id number] Date of Birth/Sex: Treating RN: 1945-09-15 (75 y.o. [name] Primary Care Katja Blue: Elyn Peers Other Clinician: Referring Melaney Tellefsen: Treating Lalo Tromp/Extender: Arnette Schaumann Weeks in Treatment: 5 Active Problems Location of Pain Severity and Description of Pain Patient Has Paino No Site Locations Pain Management and Medication Current Pain Management: Electronic Signature(s) Signed: 05/17/2021 5:07:57 PM By: Lorrin Jackson Entered By: Lorrin Jackson on 05/17/2021 09:05:36 -------------------------------------------------------------------------------- Patient/Caregiver Education Details Patient Name: Date of Service: Larry Welch 12/8/2022andnbsp8:30 A M Medical Record Number: [id number] Patient Account Number: [id number] Date of Birth/Gender: Treating RN: 1945-12-03 (75 y.o. [name] Primary Care Physician: Elyn Peers Other Clinician: Referring Physician: Treating Physician/Extender: Jeannine Boga in Treatment: 5 Education Assessment Education Provided To: Patient Education Topics Provided Venous: Methods: Explain/Verbal, Printed Responses: State content correctly Wound/Skin Impairment: Methods: Explain/Verbal, Printed  Responses: State content correctly Electronic Signature(s) Signed: 05/17/2021 5:07:57 PM By: Lorrin Jackson Entered By: Lorrin Jackson on 05/17/2021 09:16:48 -------------------------------------------------------------------------------- Wound Assessment Details Patient  Name: Date of Service: Larry Welch RLES E. 05/17/2021 8:30 A M Medical Record Number: [id number] Patient Account Number: [id number] Date of Birth/Sex: Treating RN: March 19, 1946 (75 y.o. [name] Primary Care Tanayah Squitieri: Elyn Peers Other Clinician: Referring Nashali Ditmer: Treating Raelea Gosse/Extender: Arnette Schaumann Weeks in Treatment: 5 Wound Status Wound Number: 1 Primary Etiology: Trauma, Other Wound Location: Left, Anterior Lower Leg Wound Status: Open Wounding Event: Trauma Comorbid History: Hypertension Date Acquired: 02/08/2021 Weeks Of Treatment: 5 Clustered Wound: Yes Photos Photo Uploaded By: Levan Hurst on 05/17/2021 10:25:33 Wound Measurements Length: (cm) 3.1 Width: (cm) 2.5 Depth: (cm) 0.4 Area: (cm) 6.087 Volume: (cm) 2.435 % Reduction in Area: 41.3% % Reduction in Volume: 80.4% Epithelialization: Small (1-33%) Tunneling: Yes Position (o'clock): 3 Maximum Distance: (cm) 2.5 Undermining: No Wound Description Classification: Full Thickness Without Exposed Support Structures Wound Margin: Distinct, outline attached Exudate Amount: Medium Exudate Type: Serosanguineous Exudate Color: red, brown Foul Odor After Cleansing: No Slough/Fibrino Yes Wound Bed Granulation Amount: Large (67-100%) Exposed Structure Granulation Quality: Red, Pink Fascia Exposed: No Necrotic Amount: Small (1-33%) Fat Layer (Subcutaneous Tissue) Exposed: Yes Necrotic Quality: Adherent Slough Tendon Exposed: No Muscle Exposed: No Joint Exposed: No Bone Exposed: No Treatment Notes Wound #1 (Lower Leg) Wound Laterality: Left, Anterior Cleanser Soap and Water Discharge Instruction: May shower and wash wound with dial antibacterial soap and water prior to dressing change. Wound Cleanser Discharge Instruction: Cleanse the wound with wound cleanser prior to applying a clean dressing using gauze sponges, not tissue or cotton balls. Peri-Wound Care Sween Lotion  (Moisturizing lotion) Discharge Instruction: Apply moisturizing lotion as directed Topical Gentamicin Discharge Instruction: As directed by physician Primary Dressing Hydrofera Blue Classic Foam, 4x4 in Discharge Instruction: Moisten with saline prior to applying to wound bed Secondary Dressing Woven Gauze Sponge, Non-Sterile 4x4 in Discharge Instruction: Apply over primary dressing as directed. ABD Pad, 5x9 Discharge Instruction: Apply over primary dressing as directed. Secured With Compression Wrap Kerlix Roll 4.5x3.1 (in/yd) Discharge Instruction: Apply Kerlix and Coban compression as directed. Coban Self-Adherent Wrap 4x5 (in/yd) Discharge Instruction: Apply over Kerlix as directed. Compression Stockings Add-Ons Electronic Signature(s) Signed: 05/17/2021 5:07:57 PM By: Lorrin Jackson Entered By: Lorrin Jackson on 05/17/2021 09:11:26 -------------------------------------------------------------------------------- Vitals Details Patient Name: Date of Service: Larry Welch, [name] 05/17/2021 8:30 A M Medical Record Number: [id number] Patient Account Number: [id number] Date of Birth/Sex: Treating RN: 12/25/1945 (75 y.o. [name] Primary Care Fara Worthy: Elyn Peers Other Clinician: Referring Kadince Boxley: Treating Harvir Patry/Extender: Arnette Schaumann Weeks in Treatment: 5 Vital Signs Time Taken: 09:05 Temperature (F): 98.3 Height (in): 72 Pulse (bpm): 55 Weight (lbs): 195 Respiratory Rate (breaths/min): 18 Body Mass Index (BMI): 26.4 Blood Pressure (mmHg): 145/81 Reference Range: 80 - 120 mg / dl Electronic Signature(s) Signed: 05/17/2021 5:07:57 PM By: Lorrin Jackson Entered By: Lorrin Jackson on 05/17/2021 09:05:31

## 2021-05-17 NOTE — Progress Notes (Signed)
Larry, Welch ([id number]) Visit Report for 05/17/2021 Chief Complaint Document Details Patient Name: Date of Service: Larry Welch RLES E. 05/17/2021 8:30 A M Medical Record Number: [id number] Patient Account Number: [id number] Date of Birth/Sex: Treating RN: July 02, 1945 (75 y.o. M) Primary Care Provider: Elyn Welch Other Clinician: Referring Provider: Treating Provider/Extender: Arnette Schaumann Weeks in Treatment: 5 Information Obtained from: Patient Chief Complaint Left lower extremity wound Electronic Signature(s) Signed: 05/17/2021 9:34:51 AM By: Larry Shan DO Entered By: Larry Welch on 05/17/2021 09:28:50 -------------------------------------------------------------------------------- HPI Details Patient Name: Date of Service: Larry Welch, [name] 05/17/2021 8:30 A M Medical Record Number: [id number] Patient Account Number: [id number] Date of Birth/Sex: Treating RN: 09-04-1945 (75 y.o. M) Primary Care Provider: Elyn Welch Other Clinician: Referring Provider: Treating Provider/Extender: Arnette Schaumann Weeks in Treatment: 5 History of Present Illness HPI Description: Admission 04/06/2021 Mr. Cruze Welch is a 75 year old male with a past medical history of type 2 diabetes on oral medications, essential hypertension and OSA who presents to the clinic for a 26-month history of nonhealing wound to his left lower extremity. He states that he developed a wound from a pressure washer in the beginning of September. He states that the wound had improved to the point that it almost closed and then became significantly worse in the past couple weeks. He visited the ED on 04/04/2021 for this issue and was started on doxycycline. He currently denies signs of infection. 11/3; patient presents for follow-up. He has been using Hydrofera Blue without issues. He has no issues or complaints today. He denies signs of infection. He does not have pain to the  wound site. 11/10; patient presents for follow-up. He has no issues or complaints today. He tolerated the compression wrap well. He denies signs of infection. 11/17; patient presents for 1 week follow-up. He has tolerated the compression wrap well. He has no issues or complaints today and denies signs of infection. 12/1; patient presents for follow-up. He has no issues or complaints today. He denies signs of infection. 12/8; patient presents for follow-up. He has no issues or complaints today. He denies signs of infection. Electronic Signature(s) Signed: 05/17/2021 9:34:51 AM By: Larry Shan DO Entered By: Larry Welch on 05/17/2021 09:29:07 -------------------------------------------------------------------------------- Chemical Cauterization Details Patient Name: Date of Service: Larry Welch RLES E. 05/17/2021 8:30 A M Medical Record Number: [id number] Patient Account Number: [id number] Date of Birth/Sex: Treating RN: 1945-08-30 (75 y.o. Larry Welch Primary Care Provider: Elyn Welch Other Clinician: Referring Provider: Treating Provider/Extender: Arnette Schaumann Weeks in Treatment: 5 Procedure Performed for: Wound #1 Left,Anterior Lower Leg Performed By: Physician Larry Shan, DO Post Procedure Diagnosis Same as Pre-procedure Electronic Signature(s) Signed: 05/17/2021 9:34:51 AM By: Larry Shan DO Signed: 05/17/2021 5:07:57 PM By: Larry Welch Entered By: Larry Welch on 05/17/2021 09:22:22 -------------------------------------------------------------------------------- Physical Exam Details Patient Name: Date of Service: Larry Welch RLES E. 05/17/2021 8:30 A M Medical Record Number: [id number] Patient Account Number: [id number] Date of Birth/Sex: Treating RN: 05/04/1946 (75 y.o. M) Primary Care Provider: Elyn Welch Other Clinician: Referring Provider: Treating Provider/Extender: Arnette Schaumann Weeks in Treatment:  5 Constitutional respirations regular, non-labored and within target range for patient.. Cardiovascular 2+ dorsalis pedis/posterior tibialis pulses. Psychiatric pleasant and cooperative. Notes Left lower extremity: Open wound to the anterior aspect with hyper granulated tissue and tunneling to the 3 o'clock position. No signs of infection. No drainage noted. Good edema control. Electronic  Signature(s) Signed: 05/17/2021 9:34:51 AM By: Larry Shan DO Entered By: Larry Welch on 05/17/2021 09:32:36 -------------------------------------------------------------------------------- Physician Orders Details Patient Name: Date of Service: Larry Welch, [name] 05/17/2021 8:30 A M Medical Record Number: [id number] Patient Account Number: [id number] Date of Birth/Sex: Treating RN: [birth year] (75 y.o. Larry Welch Primary Care Provider: Elyn Welch Other Clinician: Referring Provider: Treating Provider/Extender: Arnette Schaumann Weeks in Treatment: 5 Verbal / Phone Orders: No Diagnosis Coding ICD-10 Coding Code Description [phone] Non-pressure chronic ulcer of other part of left lower leg with other specified severity E11.622 Type 2 diabetes mellitus with other skin ulcer I10 Essential (primary) hypertension H20.032 Secondary infectious iridocyclitis, left eye Follow-up Appointments ppointment in 1 week. - Dr. Heber [place] Return A Bathing/ Shower/ Hygiene May shower with protection but do not get wound dressing(s) wet. Edema Control - Lymphedema / SCD / Other Elevate legs to the level of the heart or above for 30 minutes daily and/or when sitting, a frequency of: - throughout the day Avoid standing for long periods of time. Wound Treatment Wound #1 - Lower Leg Wound Laterality: Left, Anterior Cleanser: Soap and Water Every Other Day/15 Days Discharge Instructions: May shower and wash wound with dial antibacterial soap and water prior to dressing  change. Cleanser: Wound Cleanser (Generic) Every Other Day/15 Days Discharge Instructions: Cleanse the wound with wound cleanser prior to applying a clean dressing using gauze sponges, not tissue or cotton balls. Peri-Wound Care: Sween Lotion (Moisturizing lotion) Every Other Day/15 Days Discharge Instructions: Apply moisturizing lotion as directed Topical: Gentamicin Every Other Day/15 Days Discharge Instructions: As directed by physician Prim Dressing: Hydrofera Blue Classic Foam, 4x4 in (Generic) Every Other Day/15 Days ary Discharge Instructions: Moisten with saline prior to applying to wound bed Secondary Dressing: Woven Gauze Sponge, Non-Sterile 4x4 in (Generic) Every Other Day/15 Days Discharge Instructions: Apply over primary dressing as directed. Secondary Dressing: ABD Pad, 5x9 (Generic) Every Other Day/15 Days Discharge Instructions: Apply over primary dressing as directed. Compression Wrap: Kerlix Roll 4.5x3.1 (in/yd) Every Other Day/15 Days Discharge Instructions: Apply Kerlix and Coban compression as directed. Compression Wrap: Coban Self-Adherent Wrap 4x5 (in/yd) Every Other Day/15 Days Discharge Instructions: Apply over Kerlix as directed. Electronic Signature(s) Signed: 05/17/2021 9:34:51 AM By: Larry Shan DO Signed: 05/17/2021 5:07:57 PM By: Larry Welch Entered By: Larry Welch on 05/17/2021 09:22:56 -------------------------------------------------------------------------------- Problem List Details Patient Name: Date of Service: Larry Welch, [name] 05/17/2021 8:30 A M Medical Record Number: [id number] Patient Account Number: [id number] Date of Birth/Sex: Treating RN: 1946/04/27 (75 y.o. Larry Welch Primary Care Provider: Elyn Welch Other Clinician: Referring Provider: Treating Provider/Extender: Arnette Schaumann Weeks in Treatment: 5 Active Problems ICD-10 Encounter Code Description Active Date MDM Diagnosis [phone]  Non-pressure chronic ulcer of other part of left lower leg with other specified 04/06/2021 No Yes severity E11.622 Type 2 diabetes mellitus with other skin ulcer 04/19/2021 No Yes I10 Essential (primary) hypertension 04/06/2021 No Yes H20.032 Secondary infectious iridocyclitis, left eye 04/12/2021 No Yes Inactive Problems Resolved Problems Electronic Signature(s) Signed: 05/17/2021 9:34:51 AM By: Larry Shan DO Entered By: Larry Welch on 05/17/2021 09:28:32 -------------------------------------------------------------------------------- Progress Note Details Patient Name: Date of Service: Larry Welch, [name] 05/17/2021 8:30 A M Medical Record Number: [id number] Patient Account Number: [id number] Date of Birth/Sex: Treating RN: 08/31/1945 (75 y.o. M) Primary Care Provider: Elyn Welch Other Clinician: Referring Provider: Treating Provider/Extender: Arnette Schaumann Weeks in Treatment: 5 Subjective Chief Complaint Information  obtained from Patient Left lower extremity wound History of Present Illness (HPI) Admission 04/06/2021 Mr. Emad Brechtel is a 75 year old male with a past medical history of type 2 diabetes on oral medications, essential hypertension and OSA who presents to the clinic for a 23-month history of nonhealing wound to his left lower extremity. He states that he developed a wound from a pressure washer in the beginning of September. He states that the wound had improved to the point that it almost closed and then became significantly worse in the past couple weeks. He visited the ED on 04/04/2021 for this issue and was started on doxycycline. He currently denies signs of infection. 11/3; patient presents for follow-up. He has been using Hydrofera Blue without issues. He has no issues or complaints today. He denies signs of infection. He does not have pain to the wound site. 11/10; patient presents for follow-up. He has no issues or complaints  today. He tolerated the compression wrap well. He denies signs of infection. 11/17; patient presents for 1 week follow-up. He has tolerated the compression wrap well. He has no issues or complaints today and denies signs of infection. 12/1; patient presents for follow-up. He has no issues or complaints today. He denies signs of infection. 12/8; patient presents for follow-up. He has no issues or complaints today. He denies signs of infection. Patient History Information obtained from Patient, Chart. Family History Unknown History. Social History Never smoker, Marital Status - Married, Alcohol Use - Rarely, Drug Use - No History, Caffeine Use - Rarely. Medical History Cardiovascular Patient has history of Hypertension Denies history of Angina, Arrhythmia, Congestive Heart Failure, Coronary Artery Disease, Deep Vein Thrombosis, Hypotension, Myocardial Infarction, Peripheral Arterial Disease, Peripheral Venous Disease, Phlebitis, Vasculitis Integumentary (Skin) Denies history of History of Burn Hospitalization/Surgery History - back surgery/foot surgery/ radiation plant sreed. Medical A Surgical History Notes nd Cardiovascular hyperlipdemia Endocrine pre0diabetic Oncologic hx of prostate ca Objective Constitutional respirations regular, non-labored and within target range for patient.. Vitals Time Taken: 9:05 AM, Height: 72 in, Weight: 195 lbs, BMI: 26.4, Temperature: 98.3 F, Pulse: 55 bpm, Respiratory Rate: 18 breaths/min, Blood Pressure: 145/81 mmHg. Cardiovascular 2+ dorsalis pedis/posterior tibialis pulses. Psychiatric pleasant and cooperative. General Notes: Left lower extremity: Open wound to the anterior aspect with hyper granulated tissue and tunneling to the 3 o'clock position. No signs of infection. No drainage noted. Good edema control. Integumentary (Hair, Skin) Wound #1 status is Open. Original cause of wound was Trauma. The date acquired was: 02/08/2021. The wound has  been in treatment 5 weeks. The wound is located on the Left,Anterior Lower Leg. The wound measures 3.1cm length x 2.5cm width x 0.4cm depth; 6.087cm^2 area and 2.435cm^3 volume. There is Fat Layer (Subcutaneous Tissue) exposed. There is no undermining noted, however, there is tunneling at 3:00 with a maximum distance of 2.5cm. There is a medium amount of serosanguineous drainage noted. The wound margin is distinct with the outline attached to the wound base. There is large (67-100%) red, pink granulation within the wound bed. There is a small (1-33%) amount of necrotic tissue within the wound bed including Adherent Slough. Assessment Active Problems ICD-10 Non-pressure chronic ulcer of other part of left lower leg with other specified severity Type 2 diabetes mellitus with other skin ulcer Essential (primary) hypertension Secondary infectious iridocyclitis, left eye Patient's wound is stable. No signs of infection on exam. I used silver nitrate to the hyper granulated areas. I recommended continuing Hydrofera Blue and gentamicin under Kerlix/Coban. Follow-up in 1  week. Procedures Wound #1 Pre-procedure diagnosis of Wound #1 is a Trauma, Other located on the Left,Anterior Lower Leg . An Chemical Cauterization procedure was performed by Larry Shan, DO. Post procedure Diagnosis Wound #1: Same as Pre-Procedure Plan Follow-up Appointments: Return Appointment in 1 week. - Dr. Heber [place] Bathing/ Shower/ Hygiene: May shower with protection but do not get wound dressing(s) wet. Edema Control - Lymphedema / SCD / Other: Elevate legs to the level of the heart or above for 30 minutes daily and/or when sitting, a frequency of: - throughout the day Avoid standing for long periods of time. WOUND #1: - Lower Leg Wound Laterality: Left, Anterior Cleanser: Soap and Water Every Other Day/15 Days Discharge Instructions: May shower and wash wound with dial antibacterial soap and water prior to dressing  change. Cleanser: Wound Cleanser (Generic) Every Other Day/15 Days Discharge Instructions: Cleanse the wound with wound cleanser prior to applying a clean dressing using gauze sponges, not tissue or cotton balls. Peri-Wound Care: Sween Lotion (Moisturizing lotion) Every Other Day/15 Days Discharge Instructions: Apply moisturizing lotion as directed Topical: Gentamicin Every Other Day/15 Days Discharge Instructions: As directed by physician Prim Dressing: Hydrofera Blue Classic Foam, 4x4 in (Generic) Every Other Day/15 Days ary Discharge Instructions: Moisten with saline prior to applying to wound bed Secondary Dressing: Woven Gauze Sponge, Non-Sterile 4x4 in (Generic) Every Other Day/15 Days Discharge Instructions: Apply over primary dressing as directed. Secondary Dressing: ABD Pad, 5x9 (Generic) Every Other Day/15 Days Discharge Instructions: Apply over primary dressing as directed. Com pression Wrap: Kerlix Roll 4.5x3.1 (in/yd) Every Other Day/15 Days Discharge Instructions: Apply Kerlix and Coban compression as directed. Com pression Wrap: Coban Self-Adherent Wrap 4x5 (in/yd) Every Other Day/15 Days Discharge Instructions: Apply over Kerlix as directed. 1. Silver nitrate 2. Continue Hydrofera Blue and gentamicin under Kerlix/Coban 3. Follow-up in 1 week Electronic Signature(s) Signed: 05/17/2021 9:34:51 AM By: Larry Shan DO Entered By: Larry Welch on 05/17/2021 09:34:18 -------------------------------------------------------------------------------- HxROS Details Patient Name: Date of Service: Larry Welch, [name] 05/17/2021 8:30 A M Medical Record Number: [id number] Patient Account Number: [id number] Date of Birth/Sex: Treating RN: 03-19-1946 (75 y.o. M) Primary Care Provider: Elyn Welch Other Clinician: Referring Provider: Treating Provider/Extender: Arnette Schaumann Weeks in Treatment: 5 Information Obtained From Patient  Chart Cardiovascular Medical History: Positive for: Hypertension Negative for: Angina; Arrhythmia; Congestive Heart Failure; Coronary Artery Disease; Deep Vein Thrombosis; Hypotension; Myocardial Infarction; Peripheral Arterial Disease; Peripheral Venous Disease; Phlebitis; Vasculitis Past Medical History Notes: hyperlipdemia Endocrine Medical History: Past Medical History Notes: pre0diabetic Integumentary (Skin) Medical History: Negative for: History of Burn Oncologic Medical History: Past Medical History Notes: hx of prostate ca Immunizations Pneumococcal Vaccine: Received Pneumococcal Vaccination: No Implantable Devices None Hospitalization / Surgery History Type of Hospitalization/Surgery back surgery/foot surgery/ radiation plant sreed Family and Social History Unknown History: Yes; Never smoker; Marital Status - Married; Alcohol Use: Rarely; Drug Use: No History; Caffeine Use: Rarely; Financial Concerns: No; Food, Clothing or Shelter Needs: No; Support System Lacking: No; Transportation Concerns: No Electronic Signature(s) Signed: 05/17/2021 9:34:51 AM By: Larry Shan DO Entered By: Larry Welch on 05/17/2021 09:31:26 -------------------------------------------------------------------------------- SuperBill Details Patient Name: Date of Service: Larry Welch RLES E. 05/17/2021 Medical Record Number: [id number] Patient Account Number: [id number] Date of Birth/Sex: Treating RN: 1945/10/21 (75 y.o. 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, left eye Facility  Procedures CPT4 Code: [id number] Description: 48270 - CHEM CAUT GRANULATION TISS ICD-10 Diagnosis Description L97.828 Non-pressure chronic ulcer of other part of left lower leg with other specified s Modifier: everity Quantity: 1 Physician Procedures : CPT4 Code Description Modifier [id number] - WC PHYS LEVEL 3 - EST PT ICD-10 Diagnosis Description L97.828 Non-pressure chronic ulcer of other part of left lower leg with other specified severity E11.622 Type 2 diabetes mellitus with other skin ulcer  I10 Essential (primary) hypertension H20.032 Secondary infectious iridocyclitis, left eye [id number] - WC PHYS CHEM CAUT GRAN TISSUE 1 ICD-10 Diagnosis Description L97.828 Non-pressure chronic ulcer of other part of left lower leg with other  specified severity Quantity: 1 Electronic Signature(s) Signed: 05/17/2021 9:39:55 AM By: Larry Welch Signed: 05/17/2021 9:48:24 AM By: Larry Shan DO Previous Signature: 05/17/2021 9:34:51 AM Version By: Larry Shan DO Entered By: Larry Welch on 05/17/2021 09:39:54

## 2021-05-24 ENCOUNTER — Other Ambulatory Visit: Payer: Self-pay

## 2021-05-24 ENCOUNTER — Encounter (HOSPITAL_BASED_OUTPATIENT_CLINIC_OR_DEPARTMENT_OTHER): Payer: Medicare PPO | Admitting: Internal Medicine

## 2021-05-24 DIAGNOSIS — I10 Essential (primary) hypertension: Secondary | ICD-10-CM

## 2021-05-24 DIAGNOSIS — Z8546 Personal history of malignant neoplasm of prostate: Secondary | ICD-10-CM | POA: Diagnosis not present

## 2021-05-24 DIAGNOSIS — L97822 Non-pressure chronic ulcer of other part of left lower leg with fat layer exposed: Secondary | ICD-10-CM | POA: Diagnosis not present

## 2021-05-24 DIAGNOSIS — H20032 Secondary infectious iridocyclitis, left eye: Secondary | ICD-10-CM

## 2021-05-24 DIAGNOSIS — G4733 Obstructive sleep apnea (adult) (pediatric): Secondary | ICD-10-CM | POA: Diagnosis not present

## 2021-05-24 DIAGNOSIS — L97828 Non-pressure chronic ulcer of other part of left lower leg with other specified severity: Secondary | ICD-10-CM | POA: Diagnosis not present

## 2021-05-24 DIAGNOSIS — Z7984 Long term (current) use of oral hypoglycemic drugs: Secondary | ICD-10-CM | POA: Diagnosis not present

## 2021-05-24 DIAGNOSIS — E11622 Type 2 diabetes mellitus with other skin ulcer: Secondary | ICD-10-CM | POA: Diagnosis not present

## 2021-05-28 NOTE — Progress Notes (Signed)
KEVRON, PATELLA ([id number]) Visit Report for 05/24/2021 Chief Complaint Document Details Patient Name: Date of Service: Larry Sickles RLES E. 05/24/2021 8:15 A M Medical Record Number: [id number] Patient Account Number: [id number] Date of Birth/Sex: Treating RN: 09-Apr-1946 (75 y.o. M) Primary Care Provider: Elyn Peers Other Clinician: Referring Provider: Treating Provider/Extender: Arnette Schaumann Weeks in Treatment: 6 Information Obtained from: Patient Chief Complaint Left lower extremity wound Electronic Signature(s) Signed: 05/24/2021 9:34:40 AM By: Kalman Shan DO Entered By: Kalman Shan on 05/24/2021 09:30:14 -------------------------------------------------------------------------------- HPI Details Patient Name: Date of Service: Larry Welch, Larry RLES E. 05/24/2021 8:15 A M Medical Record Number: [id number] Patient Account Number: [id number] Date of Birth/Sex: Treating RN: [birth year] (17 y.o. M) Primary Care Provider: Elyn Peers Other Clinician: Referring Provider: Treating Provider/Extender: Arnette Schaumann Weeks in Treatment: 6 History of Present Illness HPI Description: Admission 04/06/2021 Larry Welch is a 75 year old male with a past medical history of type 2 diabetes on oral medications, essential hypertension and OSA who presents to the clinic for a 31-month history of nonhealing wound to his left lower extremity. He states that he developed a wound from a pressure washer in the beginning of September. He states that the wound had improved to the point that it almost closed and then became significantly worse in the past couple weeks. He visited the ED on 04/04/2021 for this issue and was started on doxycycline. He currently denies signs of infection. 11/3; patient presents for follow-up. He has been using Hydrofera Blue without issues. He has no issues or complaints today. He denies signs of infection. He does not have pain to  the wound site. 11/10; patient presents for follow-up. He has no issues or complaints today. He tolerated the compression wrap well. He denies signs of infection. 11/17; patient presents for 1 week follow-up. He has tolerated the compression wrap well. He has no issues or complaints today and denies signs of infection. 12/1; patient presents for follow-up. He has no issues or complaints today. He denies signs of infection. 12/8; patient presents for follow-up. He has no issues or complaints today. He denies signs of infection. 12/15; patient presents for follow-up. He has no issues or complaints today. Electronic Signature(s) Signed: 05/24/2021 9:34:40 AM By: Kalman Shan DO Entered By: Kalman Shan on 05/24/2021 09:30:35 -------------------------------------------------------------------------------- Chemical Cauterization Details Patient Name: Date of Service: Larry Sickles RLES E. 05/24/2021 8:15 A M Medical Record Number: [id number] Patient Account Number: [id number] Date of Birth/Sex: Treating RN: 11-13-1945 (13 y.o. [name] Primary Care Provider: Elyn Peers Other Clinician: Referring Provider: Treating Provider/Extender: Arnette Schaumann Weeks in Treatment: 6 Procedure Performed for: Wound #1 Left,Anterior Lower Leg Performed By: Physician Kalman Shan, DO Post Procedure Diagnosis Same as Pre-procedure Notes silver nitrate Electronic Signature(s) Signed: 05/24/2021 9:34:40 AM By: Kalman Shan DO Signed: 05/28/2021 5:28:24 PM By: Levan Hurst RN, BSN Entered By: Levan Hurst on 05/24/2021 08:48:17 -------------------------------------------------------------------------------- Physical Exam Details Patient Name: Date of Service: Larry Sickles RLES E. 05/24/2021 8:15 A M Medical Record Number: [id number] Patient Account Number: [id number] Date of Birth/Sex: Treating RN: 05/03/1946 (55 y.o. M) Primary Care Provider: Elyn Peers Other  Clinician: Referring Provider: Treating Provider/Extender: Arnette Schaumann Weeks in Treatment: 6 Constitutional respirations regular, non-labored and within target range for patient.. Cardiovascular 2+ dorsalis pedis/posterior tibialis pulses. Psychiatric pleasant and cooperative. Notes Left lower extremity: Open wound to the anterior aspect with hyper granulated tissue and  tunneling to the 3 o'clock position. No signs of infection. No drainage noted. Good edema control. Electronic Signature(s) Signed: 05/24/2021 9:34:40 AM By: Kalman Shan DO Entered By: Kalman Shan on 05/24/2021 09:31:28 -------------------------------------------------------------------------------- Physician Orders Details Patient Name: Date of Service: Larry Welch, Larry RLES E. 05/24/2021 8:15 A M Medical Record Number: [id number] Patient Account Number: [id number] Date of Birth/Sex: Treating RN: 12-12-1945 (16 y.o. [name] Primary Care Provider: Elyn Peers Other Clinician: Referring Provider: Treating Provider/Extender: Arnette Schaumann Weeks in Treatment: 6 Verbal / Phone Orders: No Diagnosis Coding ICD-10 Coding Code Description [phone] Non-pressure chronic ulcer of other part of left lower leg with other specified severity E11.622 Type 2 diabetes mellitus with other skin ulcer I10 Essential (primary) hypertension H20.032 Secondary infectious iridocyclitis, left eye Follow-up Appointments ppointment in 1 week. - Dr. Heber [place] Return A Bathing/ Shower/ Hygiene May shower with protection but do not get wound dressing(s) wet. Edema Control - Lymphedema / SCD / Other Elevate legs to the level of the heart or above for 30 minutes daily and/or when sitting, a frequency of: - throughout the day Avoid standing for long periods of time. Wound Treatment Wound #1 - Lower Leg Wound Laterality: Left, Anterior Cleanser: Soap and Water 1 x Per Week/15  Days Discharge Instructions: May shower and wash wound with dial antibacterial soap and water prior to dressing change. Cleanser: Wound Cleanser (Generic) 1 x Per Week/15 Days Discharge Instructions: Cleanse the wound with wound cleanser prior to applying a clean dressing using gauze sponges, not tissue or cotton balls. Peri-Wound Care: Sween Lotion (Moisturizing lotion) 1 x Per Week/15 Days Discharge Instructions: Apply moisturizing lotion as directed Topical: Gentamicin 1 x Per Week/15 Days Discharge Instructions: As directed by physician Prim Dressing: Hydrofera Blue Classic Foam, 4x4 in (Generic) 1 x Per Week/15 Days ary Discharge Instructions: Moisten with saline prior to applying to wound bed Secondary Dressing: Woven Gauze Sponge, Non-Sterile 4x4 in (Generic) 1 x Per Week/15 Days Discharge Instructions: Apply over primary dressing as directed. Secondary Dressing: ABD Pad, 5x9 (Generic) 1 x Per Week/15 Days Discharge Instructions: Apply over primary dressing as directed. Compression Wrap: Kerlix Roll 4.5x3.1 (in/yd) 1 x Per Week/15 Days Discharge Instructions: Apply Kerlix and Coban compression as directed. Compression Wrap: Coban Self-Adherent Wrap 4x5 (in/yd) 1 x Per Week/15 Days Discharge Instructions: Apply over Kerlix as directed. Electronic Signature(s) Signed: 05/24/2021 9:34:40 AM By: Kalman Shan DO Entered By: Kalman Shan on 05/24/2021 09:31:49 -------------------------------------------------------------------------------- Problem List Details Patient Name: Date of Service: Larry Sickles RLES E. 05/24/2021 8:15 A M Medical Record Number: [id number] Patient Account Number: [id number] Date of Birth/Sex: Treating RN: March 19, 1946 (22 y.o. [name] Primary Care Provider: Elyn Peers Other Clinician: Referring Provider: Treating Provider/Extender: Arnette Schaumann Weeks in Treatment: 6 Active Problems ICD-10 Encounter Code Description  Active Date MDM Diagnosis [phone] Non-pressure chronic ulcer of other part of left lower leg with fat layer 04/06/2021 No Yes exposed E11.622 Type 2 diabetes mellitus with other skin ulcer 04/19/2021 No Yes I10 Essential (primary) hypertension 04/06/2021 No Yes H20.032 Secondary infectious iridocyclitis, left eye 04/12/2021 No Yes Inactive Problems Resolved Problems Electronic Signature(s) Signed: 05/24/2021 9:34:40 AM By: Kalman Shan DO Entered By: Kalman Shan on 05/24/2021 09:29:45 -------------------------------------------------------------------------------- Progress Note Details Patient Name: Date of Service: Larry Sickles RLES E. 05/24/2021 8:15 A M Medical Record Number: [id number] Patient Account Number: [id number] Date of Birth/Sex: Treating RN: 1946/03/12 (42 y.o. M) Primary Care Provider: Elyn Peers  Other Clinician: Referring Provider: Treating Provider/Extender: Arnette Schaumann Weeks in Treatment: 6 Subjective Chief Complaint Information obtained from Patient Left lower extremity wound History of Present Illness (HPI) Admission 04/06/2021 Larry Welch is a 75 year old male with a past medical history of type 2 diabetes on oral medications, essential hypertension and OSA who presents to the clinic for a 62-month history of nonhealing wound to his left lower extremity. He states that he developed a wound from a pressure washer in the beginning of September. He states that the wound had improved to the point that it almost closed and then became significantly worse in the past couple weeks. He visited the ED on 04/04/2021 for this issue and was started on doxycycline. He currently denies signs of infection. 11/3; patient presents for follow-up. He has been using Hydrofera Blue without issues. He has no issues or complaints today. He denies signs of infection. He does not have pain to the wound site. 11/10; patient presents for follow-up. He  has no issues or complaints today. He tolerated the compression wrap well. He denies signs of infection. 11/17; patient presents for 1 week follow-up. He has tolerated the compression wrap well. He has no issues or complaints today and denies signs of infection. 12/1; patient presents for follow-up. He has no issues or complaints today. He denies signs of infection. 12/8; patient presents for follow-up. He has no issues or complaints today. He denies signs of infection. 12/15; patient presents for follow-up. He has no issues or complaints today. Patient History Information obtained from Patient, Chart. Family History Unknown History. Social History Never smoker, Marital Status - Married, Alcohol Use - Rarely, Drug Use - No History, Caffeine Use - Rarely. Medical History Cardiovascular Patient has history of Hypertension Denies history of Angina, Arrhythmia, Congestive Heart Failure, Coronary Artery Disease, Deep Vein Thrombosis, Hypotension, Myocardial Infarction, Peripheral Arterial Disease, Peripheral Venous Disease, Phlebitis, Vasculitis Integumentary (Skin) Denies history of History of Burn Hospitalization/Surgery History - back surgery/foot surgery/ radiation plant sreed. Medical A Surgical History Notes nd Cardiovascular hyperlipdemia Endocrine pre0diabetic Oncologic hx of prostate ca Objective Constitutional respirations regular, non-labored and within target range for patient.. Vitals Time Taken: 8:23 AM, Height: 72 in, Weight: 195 lbs, BMI: 26.4, Temperature: 98.3 F, Pulse: 59 bpm, Respiratory Rate: 18 breaths/min, Blood Pressure: 190/73 mmHg. Cardiovascular 2+ dorsalis pedis/posterior tibialis pulses. Psychiatric pleasant and cooperative. General Notes: Left lower extremity: Open wound to the anterior aspect with hyper granulated tissue and tunneling to the 3 o'clock position. No signs of infection. No drainage noted. Good edema control. Integumentary (Hair,  Skin) Wound #1 status is Open. Original cause of wound was Trauma. The date acquired was: 02/08/2021. The wound has been in treatment 6 weeks. The wound is located on the Left,Anterior Lower Leg. The wound measures 3cm length x 2.4cm width x 0.1cm depth; 5.655cm^2 area and 0.565cm^3 volume. There is Fat Layer (Subcutaneous Tissue) exposed. There is no undermining noted, however, there is tunneling at 3:00 with a maximum distance of 2.4cm. There is a medium amount of serosanguineous drainage noted. The wound margin is distinct with the outline attached to the wound base. There is large (67-100%) red, pink granulation within the wound bed. There is no necrotic tissue within the wound bed. Assessment Active Problems ICD-10 Non-pressure chronic ulcer of other part of left lower leg with fat layer exposed Type 2 diabetes mellitus with other skin ulcer Essential (primary) hypertension Secondary infectious iridocyclitis, left eye Patient's wound is stable. No signs of  infection on exam. I used silver nitrate to the hyper granulated areas. I recommended continuing Hydrofera Blue under compression therapy. At this time I think patient would benefit from a skin substitute. We will run an IVR for TheraSkin, Grafix and Oasis. Procedures Wound #1 Pre-procedure diagnosis of Wound #1 is a Trauma, Other located on the Left,Anterior Lower Leg . An Chemical Cauterization procedure was performed by Kalman Shan, DO. Post procedure Diagnosis Wound #1: Same as Pre-Procedure Notes: silver nitrate Plan Follow-up Appointments: Return Appointment in 1 week. - Dr. Heber [place] Bathing/ Shower/ Hygiene: May shower with protection but do not get wound dressing(s) wet. Edema Control - Lymphedema / SCD / Other: Elevate legs to the level of the heart or above for 30 minutes daily and/or when sitting, a frequency of: - throughout the day Avoid standing for long periods of time. WOUND #1: - Lower Leg Wound Laterality:  Left, Anterior Cleanser: Soap and Water 1 x Per Week/15 Days Discharge Instructions: May shower and wash wound with dial antibacterial soap and water prior to dressing change. Cleanser: Wound Cleanser (Generic) 1 x Per Week/15 Days Discharge Instructions: Cleanse the wound with wound cleanser prior to applying a clean dressing using gauze sponges, not tissue or cotton balls. Peri-Wound Care: Sween Lotion (Moisturizing lotion) 1 x Per Week/15 Days Discharge Instructions: Apply moisturizing lotion as directed Topical: Gentamicin 1 x Per Week/15 Days Discharge Instructions: As directed by physician Prim Dressing: Hydrofera Blue Classic Foam, 4x4 in (Generic) 1 x Per Week/15 Days ary Discharge Instructions: Moisten with saline prior to applying to wound bed Secondary Dressing: Woven Gauze Sponge, Non-Sterile 4x4 in (Generic) 1 x Per Week/15 Days Discharge Instructions: Apply over primary dressing as directed. Secondary Dressing: ABD Pad, 5x9 (Generic) 1 x Per Week/15 Days Discharge Instructions: Apply over primary dressing as directed. Com pression Wrap: Kerlix Roll 4.5x3.1 (in/yd) 1 x Per Week/15 Days Discharge Instructions: Apply Kerlix and Coban compression as directed. Com pression Wrap: Coban Self-Adherent Wrap 4x5 (in/yd) 1 x Per Week/15 Days Discharge Instructions: Apply over Kerlix as directed. 1. Hydrofera Blue under Kerlix/Coban 2. Run IVR for skin substitute 3. Follow-up in 1 week Electronic Signature(s) Signed: 05/24/2021 9:34:40 AM By: Kalman Shan DO Entered By: Kalman Shan on 05/24/2021 09:32:55 -------------------------------------------------------------------------------- HxROS Details Patient Name: Date of Service: Larry Welch, Larry RLES E. 05/24/2021 8:15 A M Medical Record Number: [id number] Patient Account Number: [id number] Date of Birth/Sex: Treating RN: 12-02-1945 (89 y.o. M) Primary Care Provider: Elyn Peers Other Clinician: Referring Provider: Treating  Provider/Extender: Arnette Schaumann Weeks in Treatment: 6 Information Obtained From Patient Chart Cardiovascular Medical History: Positive for: Hypertension Negative for: Angina; Arrhythmia; Congestive Heart Failure; Coronary Artery Disease; Deep Vein Thrombosis; Hypotension; Myocardial Infarction; Peripheral Arterial Disease; Peripheral Venous Disease; Phlebitis; Vasculitis Past Medical History Notes: hyperlipdemia Endocrine Medical History: Past Medical History Notes: pre0diabetic Integumentary (Skin) Medical History: Negative for: History of Burn Oncologic Medical History: Past Medical History Notes: hx of prostate ca Immunizations Pneumococcal Vaccine: Received Pneumococcal Vaccination: No Implantable Devices None Hospitalization / Surgery History Type of Hospitalization/Surgery back surgery/foot surgery/ radiation plant sreed Family and Social History Unknown History: Yes; Never smoker; Marital Status - Married; Alcohol Use: Rarely; Drug Use: No History; Caffeine Use: Rarely; Financial Concerns: No; Food, Clothing or Shelter Needs: No; Support System Lacking: No; Transportation Concerns: No Electronic Signature(s) Signed: 05/24/2021 9:34:40 AM By: Kalman Shan DO Entered By: Kalman Shan on 05/24/2021 09:30:44 -------------------------------------------------------------------------------- SuperBill Details Patient Name: Date of Service: Larry Welch, Larry RLES  E. 05/24/2021 Medical Record Number: [id number] Patient Account Number: [id number] Date of Birth/Sex: Treating RN: 07-12-1945 (59 y.o. 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, left eye Facility Procedures CPT4 Code: [id number] Description: 93734 - CHEM CAUT GRANULATION TISS ICD-10 Diagnosis Description L97.822 Non-pressure chronic ulcer of other part of left lower leg with fat layer expos Modifier: ed Quantity: 1 Physician Procedures : CPT4 Code Description Modifier [id number] - WC PHYS LEVEL 3 - EST PT ICD-10 Diagnosis Description L97.822 Non-pressure chronic ulcer of other part of left lower leg with fat layer exposed E11.622 Type 2 diabetes mellitus with other skin ulcer I10  Essential (primary) hypertension H20.032 Secondary infectious iridocyclitis, left eye Quantity: 1 : [id number] - WC PHYS CHEM CAUT GRAN TISSUE 1 ICD-10 Diagnosis Description L97.822 Non-pressure chronic ulcer of other part of left lower leg with fat layer exposed Quantity: Electronic Signature(s) Signed: 05/24/2021 9:56:44 AM By: Kalman Shan DO Signed: 05/28/2021 5:28:24 PM By: Levan Hurst RN, BSN Previous Signature: 05/24/2021 9:34:40 AM Version By: Kalman Shan DO Entered By: Levan Hurst on 05/24/2021 09:53:26

## 2021-05-28 NOTE — Progress Notes (Signed)
DAL, BLEW ([id number]) Visit Report for 05/24/2021 Arrival Information Details Patient Name: Date of Service: Larry Welch 05/24/2021 8:15 A M Medical Record Number: [id number] Patient Account Number: [id number] Date of Birth/Sex: Treating RN: [birth year] (75 y.o. [name] Primary Care Provider: Elyn Peers Other Clinician: Referring Provider: Treating Provider/Extender: Arnette Schaumann Weeks in Treatment: 6 Visit Information History Since Last Visit Added or deleted any medications: No Patient Arrived: Ambulatory Any new allergies or adverse reactions: No Arrival Time: 08:23 Had a fall or experienced change in No Accompanied By: alone activities of daily living that may affect Transfer Assistance: None risk of falls: Patient Identification Verified: Yes Signs or symptoms of abuse/neglect since last visito No Secondary Verification Process Completed: Yes Hospitalized since last visit: No Patient Requires Transmission-Based Precautions: No Implantable device outside of the clinic excluding No Patient Has Alerts: Yes cellular tissue based products placed in the center Patient Alerts: R ABI: 1.2 TBI: 0.88 since last visit: L ABI: 1.22 TBI: 1.07 Has Dressing in Place as Prescribed: Yes Has Compression in Place as Prescribed: Yes Pain Present Now: No Electronic Signature(s) Signed: 05/28/2021 5:28:24 PM By: Levan Hurst RN, BSN Entered By: Levan Hurst on 05/24/2021 08:23:42 -------------------------------------------------------------------------------- Encounter Discharge Information Details Patient Name: Date of Service: Larry Welch, Larry Larry E. 05/24/2021 8:15 A M Medical Record Number: [id number] Patient Account Number: [id number] Date of Birth/Sex: Treating RN: 01-02-1946 (83 y.o. [name] Primary Care Provider: Elyn Peers Other Clinician: Referring Provider: Treating Provider/Extender: Jeannine Boga in Treatment: 6 Encounter Discharge Information Items Discharge Condition: Stable Ambulatory Status: Ambulatory Discharge Destination: Home Transportation: Private Auto Accompanied By: alone Schedule Follow-up Appointment: Yes Clinical Summary of Care: Patient Declined Electronic Signature(s) Signed: 05/28/2021 5:28:24 PM By: Levan Hurst RN, BSN Entered By: Levan Hurst on 05/24/2021 09:52:31 -------------------------------------------------------------------------------- Lower Extremity Assessment Details Patient Name: Date of Service: Larry Welch Larry E. 05/24/2021 8:15 A M Medical Record Number: [id number] Patient Account Number: [id number] Date of Birth/Sex: Treating RN: 01/14/1946 (66 y.o. [name] Primary Care Provider: Elyn Peers Other Clinician: Referring Provider: Treating Provider/Extender: Arnette Schaumann Weeks in Treatment: 6 Edema Assessment Assessed: [Left: No] [Right: No] Edema: [Left: Ye] [Right: s] Calf Left: Right: Point of Measurement: 41 cm From Medial Instep 38 cm Ankle Left: Right: Point of Measurement: 8 cm From Medial Instep 24 cm Vascular Assessment Pulses: Dorsalis Pedis Palpable: [Left:Yes] Electronic Signature(s) Signed: 05/28/2021 5:28:24 PM By: Levan Hurst RN, BSN Entered By: Levan Hurst on 05/24/2021 08:31:51 -------------------------------------------------------------------------------- Multi Wound Chart Details Patient Name: Date of Service: Larry Welch Larry E. 05/24/2021 8:15 A M Medical Record Number: [id number] Patient Account Number: [id number] Date of Birth/Sex: Treating RN: Mar 21, 1946 (40 y.o. M) Primary Care Provider: Elyn Peers Other Clinician: Referring Provider: Treating Provider/Extender: Arnette Schaumann Weeks in Treatment: 6 Vital Signs Height(in): 72 Pulse(bpm): 59 Weight(lbs): 195 Blood Pressure(mmHg): 190/73 Body Mass Index(BMI):  26 Temperature(F): 98.3 Respiratory Rate(breaths/min): 18 Photos: [1:No Photos Left, Anterior Lower Leg] [N/A:N/A N/A] Wound Location: [1:Trauma] [N/A:N/A] Wounding Event: [1:Trauma, Other] [N/A:N/A] Primary Etiology: [1:Hypertension] [N/A:N/A] Comorbid History: [1:02/08/2021] [N/A:N/A] Date Acquired: [1:6] [N/A:N/A] Weeks of Treatment: [1:Open] [N/A:N/A] Wound Status: [1:Yes] [N/A:N/A] Clustered Wound: [1:3x2.4x0.1] [N/A:N/A] Measurements L x W x D (cm) [1:5.655] [N/A:N/A] A (cm) : rea [1:0.565] [N/A:N/A] Volume (cm) : [1:45.50%] [N/A:N/A] % Reduction in A rea: [1:95.50%] [N/A:N/A] % Reduction in Volume: [1:3] Position 1 (o'clock): [1:2.4] Maximum  Distance 1 (cm): [1:Yes] [N/A:N/A] Tunneling: [1:Full Thickness Without Exposed] [N/A:N/A] Classification: [1:Support Structures Medium] [N/A:N/A] Exudate Amount: [1:Serosanguineous] [N/A:N/A] Exudate Type: [1:red, brown] [N/A:N/A] Exudate Color: [1:Distinct, outline attached] [N/A:N/A] Wound Margin: [1:Large (67-100%)] [N/A:N/A] Granulation Amount: [1:Red, Pink] [N/A:N/A] Granulation Quality: [1:None Present (0%)] [N/A:N/A] Necrotic Amount: [1:Fat Layer (Subcutaneous Tissue): Yes N/A] Exposed Structures: [1:Fascia: No Tendon: No Muscle: No Joint: No Bone: No Small (1-33%)] [N/A:N/A] Epithelialization: [1:Chemical Cauterization] [N/A:N/A] Treatment Notes Electronic Signature(s) Signed: 05/24/2021 9:34:40 AM By: Kalman Shan DO Entered By: Kalman Shan on 05/24/2021 09:29:57 -------------------------------------------------------------------------------- Multi-Disciplinary Care Plan Details Patient Name: Date of Service: Larry Welch Larry E. 05/24/2021 8:15 A M Medical Record Number: [id number] Patient Account Number: [id number] Date of Birth/Sex: Treating RN: 30-Jan-1946 (75 y.o. [name] Primary Care Nakai Pollio: Elyn Peers Other Clinician: Referring Aden Sek: Treating Nain Rudd/Extender: Arnette Schaumann Weeks in Treatment: 6 Active Inactive Wound/Skin Impairment Nursing Diagnoses: Impaired tissue integrity Knowledge deficit related to ulceration/compromised skin integrity Goals: Patient/caregiver will verbalize understanding of skin care regimen Date Initiated: 04/06/2021 Target Resolution Date: 06/08/2021 Goal Status: Active Ulcer/skin breakdown will have a volume reduction of 30% by week 4 Date Initiated: 04/06/2021 Date Inactivated: 05/10/2021 Target Resolution Date: 05/10/2021 Goal Status: Met Ulcer/skin breakdown will have a volume reduction of 50% by week 8 Date Initiated: 04/06/2021 Target Resolution Date: 06/08/2021 Goal Status: Active Interventions: Assess patient/caregiver ability to obtain necessary supplies Assess patient/caregiver ability to perform ulcer/skin care regimen upon admission and as needed Assess ulceration(s) every visit Notes: Electronic Signature(s) Signed: 05/28/2021 5:28:24 PM By: Levan Hurst RN, BSN Entered By: Levan Hurst on 05/24/2021 08:41:38 -------------------------------------------------------------------------------- Pain Assessment Details Patient Name: Date of Service: Larry Welch Larry E. 05/24/2021 8:15 A M Medical Record Number: [id number] Patient Account Number: [id number] Date of Birth/Sex: Treating RN: [birth year] (75 y.o. [name] Primary Care Aydden Cumpian: Elyn Peers Other Clinician: Referring Chasady Longwell: Treating Elihu Milstein/Extender: Arnette Schaumann Weeks in Treatment: 6 Active Problems Location of Pain Severity and Description of Pain Patient Has Paino No Site Locations Pain Management and Medication Current Pain Management: Electronic Signature(s) Signed: 05/28/2021 5:28:24 PM By: Levan Hurst RN, BSN Entered By: Levan Hurst on 05/24/2021 08:24:31 -------------------------------------------------------------------------------- Patient/Caregiver Education  Details Patient Name: Date of Service: Larry Welch 12/15/2022andnbsp8:15 A M Medical Record Number: [id number] Patient Account Number: [id number] Date of Birth/Gender: Treating RN: Mar 24, 1946 (32 y.o. [name] Primary Care Physician: Elyn Peers Other Clinician: Referring Physician: Treating Physician/Extender: Jeannine Boga in Treatment: 6 Education Assessment Education Provided To: Patient Education Topics Provided Wound/Skin Impairment: Methods: Explain/Verbal Responses: State content correctly Electronic Signature(s) Signed: 05/28/2021 5:28:24 PM By: Levan Hurst RN, BSN Entered By: Levan Hurst on 05/24/2021 08:42:03 -------------------------------------------------------------------------------- Wound Assessment Details Patient Name: Date of Service: Larry Welch Larry E. 05/24/2021 8:15 A M Medical Record Number: [id number] Patient Account Number: [id number] Date of Birth/Sex: Treating RN: Jan 10, 1946 (75 y.o. [name] Primary Care Cheyenne Bordeaux: Elyn Peers Other Clinician: Referring Gevork Ayyad: Treating Rael Yo/Extender: Arnette Schaumann Weeks in Treatment: 6 Wound Status Wound Number: 1 Primary Etiology: Trauma, Other Wound Location: Left, Anterior Lower Leg Wound Status: Open Wounding Event: Trauma Comorbid History: Hypertension Date Acquired: 02/08/2021 Weeks Of Treatment: 6 Clustered Wound: Yes Photos Photo Uploaded By: Donavan Burnet on 05/24/2021 15:54:13 Wound Measurements Length: (cm) 3 Width: (cm) 2.4 Depth: (cm) 0.1 Area: (cm) 5.655 Volume: (cm) 0.565 % Reduction in Area: 45.5% % Reduction in Volume: 95.5% Epithelialization:  Small (1-33%) Tunneling: Yes Position (o'clock): 3 Maximum Distance: (cm) 2.4 Undermining: No Wound Description Classification: Full Thickness Without Exposed Support Structures Wound Margin: Distinct, outline attached Exudate Amount: Medium Exudate  Type: Serosanguineous Exudate Color: red, brown Foul Odor After Cleansing: No Slough/Fibrino No Wound Bed Granulation Amount: Large (67-100%) Exposed Structure Granulation Quality: Red, Pink Fascia Exposed: No Necrotic Amount: None Present (0%) Fat Layer (Subcutaneous Tissue) Exposed: Yes Tendon Exposed: No Muscle Exposed: No Joint Exposed: No Bone Exposed: No Treatment Notes Wound #1 (Lower Leg) Wound Laterality: Left, Anterior Cleanser Soap and Water Discharge Instruction: May shower and wash wound with dial antibacterial soap and water prior to dressing change. Wound Cleanser Discharge Instruction: Cleanse the wound with wound cleanser prior to applying a clean dressing using gauze sponges, not tissue or cotton balls. Peri-Wound Care Sween Lotion (Moisturizing lotion) Discharge Instruction: Apply moisturizing lotion as directed Topical Gentamicin Discharge Instruction: As directed by physician Primary Dressing Hydrofera Blue Classic Foam, 4x4 in Discharge Instruction: Moisten with saline prior to applying to wound bed Secondary Dressing Woven Gauze Sponge, Non-Sterile 4x4 in Discharge Instruction: Apply over primary dressing as directed. ABD Pad, 5x9 Discharge Instruction: Apply over primary dressing as directed. Secured With Compression Wrap Kerlix Roll 4.5x3.1 (in/yd) Discharge Instruction: Apply Kerlix and Coban compression as directed. Coban Self-Adherent Wrap 4x5 (in/yd) Discharge Instruction: Apply over Kerlix as directed. Compression Stockings Add-Ons Electronic Signature(s) Signed: 05/28/2021 5:28:24 PM By: Levan Hurst RN, BSN Entered By: Levan Hurst on 05/24/2021 08:32:19 -------------------------------------------------------------------------------- Vitals Details Patient Name: Date of Service: Larry Welch, Larry Larry E. 05/24/2021 8:15 A M Medical Record Number: [id number] Patient Account Number: [id number] Date of Birth/Sex: Treating RN: 1945-09-06 (75  y.o. [name] Primary Care Raidyn Breiner: Elyn Peers Other Clinician: Referring Anora Schwenke: Treating Angus Amini/Extender: Arnette Schaumann Weeks in Treatment: 6 Vital Signs Time Taken: 08:23 Temperature (F): 98.3 Height (in): 72 Pulse (bpm): 59 Weight (lbs): 195 Respiratory Rate (breaths/min): 18 Body Mass Index (BMI): 26.4 Blood Pressure (mmHg): 190/73 Reference Range: 80 - 120 mg / dl Electronic Signature(s) Signed: 05/28/2021 5:28:24 PM By: Levan Hurst RN, BSN Entered By: Levan Hurst on 05/24/2021 08:24:00

## 2021-05-31 ENCOUNTER — Other Ambulatory Visit: Payer: Self-pay

## 2021-05-31 ENCOUNTER — Encounter (HOSPITAL_BASED_OUTPATIENT_CLINIC_OR_DEPARTMENT_OTHER): Payer: Medicare PPO | Admitting: Internal Medicine

## 2021-05-31 DIAGNOSIS — E11622 Type 2 diabetes mellitus with other skin ulcer: Secondary | ICD-10-CM

## 2021-05-31 DIAGNOSIS — I10 Essential (primary) hypertension: Secondary | ICD-10-CM | POA: Diagnosis not present

## 2021-05-31 DIAGNOSIS — L97822 Non-pressure chronic ulcer of other part of left lower leg with fat layer exposed: Secondary | ICD-10-CM | POA: Diagnosis not present

## 2021-05-31 DIAGNOSIS — Z8546 Personal history of malignant neoplasm of prostate: Secondary | ICD-10-CM | POA: Diagnosis not present

## 2021-05-31 DIAGNOSIS — H20032 Secondary infectious iridocyclitis, left eye: Secondary | ICD-10-CM | POA: Diagnosis not present

## 2021-05-31 DIAGNOSIS — Z7984 Long term (current) use of oral hypoglycemic drugs: Secondary | ICD-10-CM | POA: Diagnosis not present

## 2021-05-31 DIAGNOSIS — G4733 Obstructive sleep apnea (adult) (pediatric): Secondary | ICD-10-CM | POA: Diagnosis not present

## 2021-05-31 DIAGNOSIS — L97828 Non-pressure chronic ulcer of other part of left lower leg with other specified severity: Secondary | ICD-10-CM | POA: Diagnosis not present

## 2021-05-31 NOTE — Progress Notes (Signed)
Larry Welch ([id number]) Visit Report for 05/31/2021 Arrival Information Details Patient Name: Date of Service: Larry Welch RLES Larry Welch: [id number] Patient Account Welch: [id number] Date of Birth/Sex: Treating RN: Mar 05, 1946 (75 y.o. Larry Welch Primary Care Larry Welch: Larry Welch Other Clinician: Referring Larry Welch: Treating Larry Welch/Extender: Larry Welch Weeks in Treatment: 7 Visit Information History Since Last Visit Added or deleted any medications: No Patient Arrived: Ambulatory Any new allergies or adverse reactions: No Arrival Time: 08:25 Had a fall or experienced change in No Accompanied By: self activities of daily living that may affect Transfer Assistance: None risk of falls: Patient Identification Verified: Yes Signs or symptoms of abuse/neglect since last visito No Secondary Verification Process Completed: Yes Hospitalized since last visit: No Patient Requires Transmission-Based Precautions: No Implantable device outside of the clinic excluding No Patient Has Alerts: Yes cellular tissue based products placed in the center Patient Alerts: R ABI: 1.2 TBI: 0.88 since last visit: L ABI: 1.22 TBI: 1.07 Has Dressing in Place as Prescribed: Yes Pain Present Now: No Electronic Signature(s) Signed: 05/31/2021 9:32:36 AM By: Larry Welch Entered By: Larry Welch on 05/31/2021 08:27:08 -------------------------------------------------------------------------------- Clinic Level of Care Assessment Details Patient Name: Date of Service: Larry Welch RLES Larry Welch: [id number] Patient Account Welch: [id number] Date of Birth/Sex: Treating RN: [birth year] (75 y.o. Larry Welch Primary Care Nakima Fluegge: Larry Welch Other Clinician: Referring Floraine Buechler: Treating Liev Brockbank/Extender: Larry Welch Weeks in Treatment: 7 Clinic Level of Care Assessment  Items TOOL 4 Quantity Score X- 1 0 Use when only an EandM is performed on FOLLOW-UP visit ASSESSMENTS - Nursing Assessment / Reassessment X- 1 10 Reassessment of Co-morbidities (includes updates in patient status) X- 1 5 Reassessment of Adherence to Treatment Plan ASSESSMENTS - Wound and Skin A ssessment / Reassessment X - Simple Wound Assessment / Reassessment - one wound 1 5 _0  - 0 Complex Wound Assessment / Reassessment - multiple wounds _1  - 0 Dermatologic / Skin Assessment (not related to wound area) ASSESSMENTS - Focused Assessment _2  - 0 Circumferential Edema Measurements - multi extremities _3  - 0 Nutritional Assessment / Counseling / Intervention X- 1 5 Lower Extremity Assessment (monofilament, tuning fork, pulses) _4  - 0 Peripheral Arterial Disease Assessment (using hand held doppler) ASSESSMENTS - Ostomy and/or Continence Assessment and Care _5  - 0 Incontinence Assessment and Management _6  - 0 Ostomy Care Assessment and Management (repouching, etc.) PROCESS - Coordination of Care X - Simple Patient / Family Education for ongoing care 1 15 _7  - 0 Complex (extensive) Patient / Family Education for ongoing care X- 1 10 Staff obtains Programmer, systems, Records, T Results / Process Orders est _8  - 0 Staff telephones HHA, Nursing Homes / Clarify orders / etc _9  - 0 Routine Transfer to another Facility (non-emergent condition) _10  - 0 Routine Hospital Admission (non-emergent condition) _11  - 0 New Admissions / Biomedical engineer / Ordering NPWT Apligraf, etc. , _12  - 0 Emergency Hospital Admission (emergent condition) X- 1 10 Simple Discharge Coordination _13  - 0 Complex (extensive) Discharge Coordination PROCESS - Special Needs _14  - 0 Pediatric / Minor Patient Management _15  - 0 Isolation Patient Management _16  - 0 Hearing / Language / Visual special needs _17  - 0 Assessment of Community assistance (transportation, D/C planning, etc.) _18  - 0 Additional  assistance / Altered mentation _19  - 0 Support Surface(s) Assessment (bed, cushion, seat, etc.) INTERVENTIONS - Wound Cleansing / Measurement X -  Simple Wound Cleansing - one wound 1 5 _0  - 0 Complex Wound Cleansing - multiple wounds X- 1 5 Wound Imaging (photographs - any Welch of wounds) _1  - 0 Wound Tracing (instead of photographs) X- 1 5 Simple Wound Measurement - one wound _2  - 0 Complex Wound Measurement - multiple wounds INTERVENTIONS - Wound Dressings _3  - 0 Small Wound Dressing one or multiple wounds _4  - 0 Medium Wound Dressing one or multiple wounds X- 1 20 Large Wound Dressing one or multiple wounds <[id number]>_9<\/FAOZHYQMVHQIONGE>_9  - 0 Application of Medications - topical <BMWUXLKGMWNUUVOZ>_3<\/GUYQIHKVQQVZDGLO>_7  - 0 Application of Medications - injection INTERVENTIONS - Miscellaneous _7  - 0 External ear exam _8  - 0 Specimen Collection (cultures, biopsies, blood, body fluids, etc.) _9  - 0 Specimen(s) / Culture(s) sent or taken to Lab for analysis _10  - 0 Patient Transfer (multiple staff / Civil Service fast streamer / Similar devices) _11  - 0 Simple Staple / Suture removal (25 or less) _12  - 0 Complex Staple / Suture removal (26 or more) _13  - 0 Hypo / Hyperglycemic Management (close monitor of Blood Glucose) _14  - 0 Ankle / Brachial Index (ABI) - do not check if billed separately X- 1 5 Vital Signs Has the patient been seen at the hospital within the last three years: Yes Total Score: 100 Level Of Care: New/Established - Level 3 Electronic Signature(s) Signed: 05/31/2021 4:57:12 PM By: Levan Hurst RN, BSN Entered By: Levan Hurst on 05/31/2021 16:16:46 -------------------------------------------------------------------------------- Encounter Discharge Information Details Patient Name: Date of Service: Larry Welch, Larry RLES Larry Welch: [id number] Patient Account Welch: [id number] Date of Birth/Sex: Treating RN: 1946/04/21 (75 y.o. Larry Welch Primary Care Annamary Buschman: Larry Welch Other  Clinician: Referring Tamasha Laplante: Treating [place]ee Nase/Extender: Jeannine Boga in Treatment: 7 Encounter Discharge Information Items Discharge Condition: Stable Ambulatory Status: Ambulatory Discharge Destination: Home Transportation: Private Auto Accompanied By: alone Schedule Follow-up Appointment: Yes Clinical Summary of Care: Patient Declined Electronic Signature(s) Signed: 05/31/2021 4:57:12 PM By: Levan Hurst RN, BSN Entered By: Levan Hurst on 05/31/2021 16:17:24 -------------------------------------------------------------------------------- Lower Extremity Assessment Details Patient Name: Date of Service: Larry Welch RLES Larry Welch: [id number] Patient Account Welch: [id number] Date of Birth/Sex: Treating RN: 06-16-1945 (75 y.o. Larry Welch Primary Care Daijon Wenke: Larry Welch Other Clinician: Referring Joely Losier: Treating Armine Rizzolo/Extender: Larry Welch Weeks in Treatment: 7 Edema Assessment Assessed: [Left: No] [Right: No] Edema: [Left: Ye] [Right: s] Calf Left: Right: Point of Measurement: 41 cm From Medial Instep 38 cm Ankle Left: Right: Point of Measurement: 8 cm From Medial Instep 24 cm Vascular Assessment Pulses: Dorsalis Pedis Palpable: [Left:Yes] Electronic Signature(s) Signed: 05/31/2021 4:57:12 PM By: Levan Hurst RN, BSN Entered By: Levan Hurst on 05/31/2021 08:41:43 -------------------------------------------------------------------------------- Multi Wound Chart Details Patient Name: Date of Service: Larry Welch RLES Larry Welch: [id number] Patient Account Welch: [id number] Date of Birth/Sex: Treating RN: 1945-10-15 (75 y.o. Larry Welch Primary Care Jordann Grime: Larry Welch Other Clinician: Referring Vasti Yagi: Treating Trever Streater/Extender: Larry Welch Weeks in Treatment: 7 Vital Signs Height(in):  72 Pulse(bpm): 64 Weight(lbs): 195 Blood Pressure(mmHg): 148/83 Body Mass Index(BMI): 26 Temperature(F): 97.8 Respiratory Rate(breaths/min): 18 Photos: [N/A:N/A] Left, Anterior Lower Leg N/A N/A Wound Location: Trauma N/A N/A Wounding Event: Trauma, Other N/A N/A Primary Etiology: Hypertension N/A N/A Comorbid History: 02/08/2021 N/A N/A Date Acquired: 7 N/A N/A Weeks of Treatment: Open N/A N/A Wound Status: Yes N/A N/A  Clustered Wound: 2.2x2.5x0.2 N/A N/A Measurements L x W x D (cm) 4.32 N/A N/A A (cm) : rea 0.864 N/A N/A Volume (cm) : 58.30% N/A N/A % Reduction in A rea: 93.10% N/A N/A % Reduction in Volume: 3 Position 1 (o'clock): 2.1 Maximum Distance 1 (cm): Yes N/A N/A Tunneling: Full Thickness Without Exposed N/A N/A Classification: Support Structures Medium N/A N/A Exudate Amount: Serosanguineous N/A N/A Exudate Type: red, brown N/A N/A Exudate Color: Distinct, outline attached N/A N/A Wound Margin: Large (67-100%) N/A N/A Granulation Amount: Red, Pink, Hyper-granulation N/A N/A Granulation Quality: None Present (0%) N/A N/A Necrotic Amount: Fat Layer (Subcutaneous Tissue): Yes N/A N/A Exposed Structures: Fascia: No Tendon: No Muscle: No Joint: No Bone: No Small (1-33%) N/A N/A Epithelialization: Treatment Notes Electronic Signature(s) Signed: 05/31/2021 9:19:59 AM By: Kalman Shan DO Signed: 05/31/2021 4:57:12 PM By: Levan Hurst RN, BSN Entered By: Kalman Shan on 05/31/2021 09:14:32 -------------------------------------------------------------------------------- Multi-Disciplinary Care Plan Details Patient Name: Date of Service: Larry Welch, Larry RLES Larry Welch: [id number] Patient Account Welch: [id number] Date of Birth/Sex: Treating RN: 02-Dec-1945 (75 y.o. Larry Welch Primary Care Stefannie Defeo: Larry Welch Other Clinician: Referring Darriana Deboy: Treating Hayven Croy/Extender: Larry Welch Weeks in Treatment: 7 Active Inactive Wound/Skin Impairment Nursing Diagnoses: Impaired tissue integrity Knowledge deficit related to ulceration/compromised skin integrity Goals: Patient/caregiver will verbalize understanding of skin care regimen Date Initiated: 04/06/2021 Target Resolution Date: 06/08/2021 Goal Status: Active Ulcer/skin breakdown will have a volume reduction of 30% by week 4 Date Initiated: 04/06/2021 Date Inactivated: 05/10/2021 Target Resolution Date: 05/10/2021 Goal Status: Met Ulcer/skin breakdown will have a volume reduction of 50% by week 8 Date Initiated: 04/06/2021 Target Resolution Date: 06/08/2021 Goal Status: Active Interventions: Assess patient/caregiver ability to obtain necessary supplies Assess patient/caregiver ability to perform ulcer/skin care regimen upon admission and as needed Assess ulceration(s) every visit Notes: Electronic Signature(s) Signed: 05/31/2021 4:57:12 PM By: Levan Hurst RN, BSN Entered By: Levan Hurst on 05/31/2021 16:16:01 -------------------------------------------------------------------------------- Pain Assessment Details Patient Name: Date of Service: Larry Welch RLES Larry Welch: [id number] Patient Account Welch: [id number] Date of Birth/Sex: Treating RN: July 19, 1945 (75 y.o. Larry Welch Primary Care Nikesh Teschner: Larry Welch Other Clinician: Referring Dariyah Garduno: Treating Lavi Sheehan/Extender: Larry Welch Weeks in Treatment: 7 Active Problems Location of Pain Severity and Description of Pain Patient Has Paino No Site Locations Pain Management and Medication Current Pain Management: Electronic Signature(s) Signed: 05/31/2021 9:32:36 AM By: Larry Welch Signed: 05/31/2021 4:57:12 PM By: Levan Hurst RN, BSN Entered By: Larry Welch on 05/31/2021  08:28:10 -------------------------------------------------------------------------------- Patient/Caregiver Education Details Patient Name: Date of Service: Larry Welch 12/22/2022andnbsp8:30 A M Medical Record Welch: [id number] Patient Account Welch: [id number] Date of Birth/Gender: Treating RN: [birth year] (75 y.o. Larry Welch Primary Care Physician: Larry Welch Other Clinician: Referring Physician: Treating Physician/Extender: Jeannine Boga in Treatment: 7 Education Assessment Education Provided To: Patient Education Topics Provided Wound/Skin Impairment: Methods: Explain/Verbal Responses: State content correctly Electronic Signature(s) Signed: 05/31/2021 4:57:12 PM By: Levan Hurst RN, BSN Entered By: Levan Hurst on 05/31/2021 16:16:15 -------------------------------------------------------------------------------- Wound Assessment Details Patient Name: Date of Service: Larry Welch RLES Larry Welch: [id number] Patient Account Welch: [id number] Date of Birth/Sex: Treating RN: 01/29/1946 (75 y.o. Larry Welch Primary Care Aubrionna Istre: Larry Welch Other Clinician: Referring Myleka Moncure: Treating Linzee Depaul/Extender: Larry Welch Weeks in Treatment: 7  Wound Status Wound Welch: 1 Primary Etiology: Trauma, Other Wound Location: Left, Anterior Lower Leg Wound Status: Open Wounding Event: Trauma Comorbid History: Hypertension Date Acquired: 02/08/2021 Weeks Of Treatment: 7 Clustered Wound: Yes Photos Wound Measurements Length: (cm) 2.2 Width: (cm) 2.5 Depth: (cm) 0.2 Area: (cm) 4.32 Volume: (cm) 0.864 % Reduction in Area: 58.3% % Reduction in Volume: 93.1% Epithelialization: Small (1-33%) Tunneling: Yes Position (o'clock): 3 Maximum Distance: (cm) 2.1 Undermining: No Wound Description Classification: Full Thickness Without Exposed Support Structures Wound Margin:  Distinct, outline attached Exudate Amount: Medium Exudate Type: Serosanguineous Exudate Color: red, brown Foul Odor After Cleansing: No Slough/Fibrino No Wound Bed Granulation Amount: Large (67-100%) Exposed Structure Granulation Quality: Red, Pink, Hyper-granulation Fascia Exposed: No Necrotic Amount: None Present (0%) Fat Layer (Subcutaneous Tissue) Exposed: Yes Tendon Exposed: No Muscle Exposed: No Joint Exposed: No Bone Exposed: No Treatment Notes Wound #1 (Lower Leg) Wound Laterality: Left, Anterior Cleanser Soap and Water Discharge Instruction: May shower and wash wound with dial antibacterial soap and water prior to dressing change. Wound Cleanser Discharge Instruction: Cleanse the wound with wound cleanser prior to applying a clean dressing using gauze sponges, not tissue or cotton balls. Peri-Wound Care Sween Lotion (Moisturizing lotion) Discharge Instruction: Apply moisturizing lotion as directed Topical Gentamicin Discharge Instruction: As directed by physician Primary Dressing Hydrofera Blue Classic Foam, 4x4 in Discharge Instruction: Moisten with saline prior to applying to wound bed Secondary Dressing Woven Gauze Sponge, Non-Sterile 4x4 in Discharge Instruction: Apply over primary dressing as directed. ABD Pad, 5x9 Discharge Instruction: Apply over primary dressing as directed. Secured With Compression Wrap Kerlix Roll 4.5x3.1 (in/yd) Discharge Instruction: Apply Kerlix and Coban compression as directed. Coban Self-Adherent Wrap 4x5 (in/yd) Discharge Instruction: Apply over Kerlix as directed. Compression Stockings Add-Ons Electronic Signature(s) Signed: 05/31/2021 4:57:12 PM By: Levan Hurst RN, BSN Entered By: Levan Hurst on 05/31/2021 08:44:00 -------------------------------------------------------------------------------- Vitals Details Patient Name: Date of Service: Larry Welch, Larry RLES Larry Welch:  [id number] Patient Account Welch: [id number] Date of Birth/Sex: Treating RN: 1946/03/27 (75 y.o. Larry Welch Primary Care Christophe Rising: Larry Welch Other Clinician: Referring Elisha Mcgruder: Treating Dale Strausser/Extender: Larry Welch Weeks in Treatment: 7 Vital Signs Time Taken: 08:27 Temperature (F): 97.8 Height (in): 72 Pulse (bpm): 64 Weight (lbs): 195 Respiratory Rate (breaths/min): 18 Body Mass Index (BMI): 26.4 Blood Pressure (mmHg): 148/83 Reference Range: 80 - 120 mg / dl Electronic Signature(s) Signed: 05/31/2021 9:32:36 AM By: Larry Welch Entered By: Larry Welch on 05/31/2021 08:28:03

## 2021-06-08 ENCOUNTER — Other Ambulatory Visit: Payer: Self-pay

## 2021-06-08 ENCOUNTER — Encounter (HOSPITAL_BASED_OUTPATIENT_CLINIC_OR_DEPARTMENT_OTHER): Payer: Medicare PPO | Admitting: Internal Medicine

## 2021-06-08 DIAGNOSIS — I10 Essential (primary) hypertension: Secondary | ICD-10-CM | POA: Diagnosis not present

## 2021-06-08 DIAGNOSIS — L97828 Non-pressure chronic ulcer of other part of left lower leg with other specified severity: Secondary | ICD-10-CM | POA: Diagnosis not present

## 2021-06-08 DIAGNOSIS — L97822 Non-pressure chronic ulcer of other part of left lower leg with fat layer exposed: Secondary | ICD-10-CM | POA: Diagnosis not present

## 2021-06-08 DIAGNOSIS — H20032 Secondary infectious iridocyclitis, left eye: Secondary | ICD-10-CM | POA: Diagnosis not present

## 2021-06-08 DIAGNOSIS — E11622 Type 2 diabetes mellitus with other skin ulcer: Secondary | ICD-10-CM

## 2021-06-08 DIAGNOSIS — Z8546 Personal history of malignant neoplasm of prostate: Secondary | ICD-10-CM | POA: Diagnosis not present

## 2021-06-08 DIAGNOSIS — G4733 Obstructive sleep apnea (adult) (pediatric): Secondary | ICD-10-CM | POA: Diagnosis not present

## 2021-06-08 DIAGNOSIS — Z7984 Long term (current) use of oral hypoglycemic drugs: Secondary | ICD-10-CM | POA: Diagnosis not present

## 2021-06-08 NOTE — Progress Notes (Signed)
HEBERT, DOOLING ([id number]) Visit Report for 05/31/2021 Chief Complaint Document Details Patient Name: Date of Service: Larry Welch RLES E. 05/31/2021 8:30 A M Medical Record Number: [id number] Patient Account Number: [id number] Date of Birth/Sex: Treating RN: 05/08/1946 (75 y.o. [name] Primary Care Provider: Elyn Peers Other Clinician: Referring Provider: Treating Provider/Extender: Arnette Schaumann Weeks in Treatment: 7 Information Obtained from: Patient Chief Complaint Left lower extremity wound Electronic Signature(s) Signed: 05/31/2021 9:19:59 AM By: Kalman Shan DO Entered By: Kalman Shan on 05/31/2021 09:14:42 -------------------------------------------------------------------------------- HPI Details Patient Name: Date of Service: Larry Welch, [name] 05/31/2021 8:30 A M Medical Record Number: [id number] Patient Account Number: [id number] Date of Birth/Sex: Treating RN: 1945-10-11 (54 y.o. [name] Primary Care Provider: Elyn Peers Other Clinician: Referring Provider: Treating Provider/Extender: Arnette Schaumann Weeks in Treatment: 7 History of Present Illness HPI Description: Admission 04/06/2021 Mr. Delonte Musich is a 75 year old male with a past medical history of type 2 diabetes on oral medications, essential hypertension and OSA who presents to the clinic for a 33-month history of nonhealing wound to his left lower extremity. He states that he developed a wound from a pressure washer in the beginning of September. He states that the wound had improved to the point that it almost closed and then became significantly worse in the past couple weeks. He visited the ED on 04/04/2021 for this issue and was started on doxycycline. He currently denies signs of infection. 11/3; patient presents for follow-up. He has been using Hydrofera Blue without issues. He has no issues or complaints today. He denies signs of  infection. He does not have pain to the wound site. 11/10; patient presents for follow-up. He has no issues or complaints today. He tolerated the compression wrap well. He denies signs of infection. 11/17; patient presents for 1 week follow-up. He has tolerated the compression wrap well. He has no issues or complaints today and denies signs of infection. 12/1; patient presents for follow-up. He has no issues or complaints today. He denies signs of infection. 12/8; patient presents for follow-up. He has no issues or complaints today. He denies signs of infection. 12/15; patient presents for follow-up. He has no issues or complaints today. 12/22; patient presents for follow-up. He is tolerated the compression wrap well. He denies signs of infection. Electronic Signature(s) Signed: 05/31/2021 9:19:59 AM By: Kalman Shan DO Entered By: Kalman Shan on 05/31/2021 09:15:03 -------------------------------------------------------------------------------- Physical Exam Details Patient Name: Date of Service: Larry Welch RLES E. 05/31/2021 8:30 A M Medical Record Number: [id number] Patient Account Number: [id number] Date of Birth/Sex: Treating RN: 10/08/1945 (59 y.o. [name] Primary Care Provider: Elyn Peers Other Clinician: Referring Provider: Treating Provider/Extender: Arnette Schaumann Weeks in Treatment: 7 Constitutional respirations regular, non-labored and within target range for patient.. Cardiovascular 2+ dorsalis pedis/posterior tibialis pulses. Psychiatric pleasant and cooperative. Notes Left lower extremity: Open wound to the anterior aspect with hyper granulated tissue and tunneling to the 3 o'clock position. No signs of infection. No drainage noted. Good edema control. Electronic Signature(s) Signed: 05/31/2021 9:19:59 AM By: Kalman Shan DO Entered By: Kalman Shan on 05/31/2021  09:15:38 -------------------------------------------------------------------------------- Physician Orders Details Patient Name: Date of Service: Larry Welch, [name] 05/31/2021 8:30 A M Medical Record Number: [id number] Patient Account Number: [id number] Date of Birth/Sex: Treating RN: Mar 29, 1946 (35 y.o. [name] Primary Care Provider: Elyn Peers Other Clinician: Referring Provider: Treating Provider/Extender: Kalman Shan  Elyn Peers Weeks in Treatment: 7 Verbal / Phone Orders: No Diagnosis Coding ICD-10 Coding Code Description [phone] Non-pressure chronic ulcer of other part of left lower leg with fat layer exposed E11.622 Type 2 diabetes mellitus with other skin ulcer I10 Essential (primary) hypertension H20.032 Secondary infectious iridocyclitis, left eye Follow-up Appointments ppointment in 1 week. - Friday with Dr. Heber [place] Return A Bathing/ Shower/ Hygiene May shower with protection but do not get wound dressing(s) wet. Edema Control - Lymphedema / SCD / Other Elevate legs to the level of the heart or above for 30 minutes daily and/or when sitting, a frequency of: - throughout the day Avoid standing for long periods of time. Wound Treatment Wound #1 - Lower Leg Wound Laterality: Left, Anterior Cleanser: Soap and Water 1 x Per Week/15 Days Discharge Instructions: May shower and wash wound with dial antibacterial soap and water prior to dressing change. Cleanser: Wound Cleanser (Generic) 1 x Per Week/15 Days Discharge Instructions: Cleanse the wound with wound cleanser prior to applying a clean dressing using gauze sponges, not tissue or cotton balls. Peri-Wound Care: Sween Lotion (Moisturizing lotion) 1 x Per Week/15 Days Discharge Instructions: Apply moisturizing lotion as directed Topical: Gentamicin 1 x Per Week/15 Days Discharge Instructions: As directed by physician Prim Dressing: Hydrofera Blue Classic Foam, 4x4 in (Generic) 1 x Per Week/15  Days ary Discharge Instructions: Moisten with saline prior to applying to wound bed Secondary Dressing: Woven Gauze Sponge, Non-Sterile 4x4 in (Generic) 1 x Per Week/15 Days Discharge Instructions: Apply over primary dressing as directed. Secondary Dressing: ABD Pad, 5x9 (Generic) 1 x Per Week/15 Days Discharge Instructions: Apply over primary dressing as directed. Compression Wrap: Kerlix Roll 4.5x3.1 (in/yd) 1 x Per Week/15 Days Discharge Instructions: Apply Kerlix and Coban compression as directed. Compression Wrap: Coban Self-Adherent Wrap 4x5 (in/yd) 1 x Per Week/15 Days Discharge Instructions: Apply over Kerlix as directed. Electronic Signature(s) Signed: 05/31/2021 9:19:59 AM By: Kalman Shan DO Entered By: Kalman Shan on 05/31/2021 09:15:52 -------------------------------------------------------------------------------- Problem List Details Patient Name: Date of Service: Larry Welch, [name] 05/31/2021 8:30 A M Medical Record Number: [id number] Patient Account Number: [id number] Date of Birth/Sex: Treating RN: June 30, 1945 (30 y.o. [name] Primary Care Provider: Elyn Peers Other Clinician: Referring Provider: Treating Provider/Extender: Arnette Schaumann Weeks in Treatment: 7 Active Problems ICD-10 Encounter Code Description Active Date MDM Diagnosis [phone] Non-pressure chronic ulcer of other part of left lower leg with fat layer 04/06/2021 No Yes exposed E11.622 Type 2 diabetes mellitus with other skin ulcer 04/19/2021 No Yes I10 Essential (primary) hypertension 04/06/2021 No Yes H20.032 Secondary infectious iridocyclitis, left eye 04/12/2021 No Yes Inactive Problems Resolved Problems Electronic Signature(s) Signed: 05/31/2021 9:19:59 AM By: Kalman Shan DO Entered By: Kalman Shan on 05/31/2021 09:14:19 -------------------------------------------------------------------------------- Progress Note Details Patient Name: Date  of Service: Larry Welch RLES E. 05/31/2021 8:30 A M Medical Record Number: [id number] Patient Account Number: [id number] Date of Birth/Sex: Treating RN: 26-May-1946 (63 y.o. [name] Primary Care Provider: Elyn Peers Other Clinician: Referring Provider: Treating Provider/Extender: Arnette Schaumann Weeks in Treatment: 7 Subjective Chief Complaint Information obtained from Patient Left lower extremity wound History of Present Illness (HPI) Admission 04/06/2021 Mr. Aedan Geimer is a 75 year old male with a past medical history of type 2 diabetes on oral medications, essential hypertension and OSA who presents to the clinic for a 57-month history of nonhealing wound to his left lower extremity. He states that he developed a wound  from a pressure washer in the beginning of September. He states that the wound had improved to the point that it almost closed and then became significantly worse in the past couple weeks. He visited the ED on 04/04/2021 for this issue and was started on doxycycline. He currently denies signs of infection. 11/3; patient presents for follow-up. He has been using Hydrofera Blue without issues. He has no issues or complaints today. He denies signs of infection. He does not have pain to the wound site. 11/10; patient presents for follow-up. He has no issues or complaints today. He tolerated the compression wrap well. He denies signs of infection. 11/17; patient presents for 1 week follow-up. He has tolerated the compression wrap well. He has no issues or complaints today and denies signs of infection. 12/1; patient presents for follow-up. He has no issues or complaints today. He denies signs of infection. 12/8; patient presents for follow-up. He has no issues or complaints today. He denies signs of infection. 12/15; patient presents for follow-up. He has no issues or complaints today. 12/22; patient presents for follow-up. He is tolerated the  compression wrap well. He denies signs of infection. Patient History Information obtained from Patient, Chart. Family History Unknown History. Social History Never smoker, Marital Status - Married, Alcohol Use - Rarely, Drug Use - No History, Caffeine Use - Rarely. Medical History Cardiovascular Patient has history of Hypertension Denies history of Angina, Arrhythmia, Congestive Heart Failure, Coronary Artery Disease, Deep Vein Thrombosis, Hypotension, Myocardial Infarction, Peripheral Arterial Disease, Peripheral Venous Disease, Phlebitis, Vasculitis Integumentary (Skin) Denies history of History of Burn Hospitalization/Surgery History - back surgery/foot surgery/ radiation plant sreed. Medical A Surgical History Notes nd Cardiovascular hyperlipdemia Endocrine pre0diabetic Oncologic hx of prostate ca Objective Constitutional respirations regular, non-labored and within target range for patient.. Vitals Time Taken: 8:27 AM, Height: 72 in, Weight: 195 lbs, BMI: 26.4, Temperature: 97.8 F, Pulse: 64 bpm, Respiratory Rate: 18 breaths/min, Blood Pressure: 148/83 mmHg. Cardiovascular 2+ dorsalis pedis/posterior tibialis pulses. Psychiatric pleasant and cooperative. General Notes: Left lower extremity: Open wound to the anterior aspect with hyper granulated tissue and tunneling to the 3 o'clock position. No signs of infection. No drainage noted. Good edema control. Integumentary (Hair, Skin) Wound #1 status is Open. Original cause of wound was Trauma. The date acquired was: 02/08/2021. The wound has been in treatment 7 weeks. The wound is located on the Left,Anterior Lower Leg. The wound measures 2.2cm length x 2.5cm width x 0.2cm depth; 4.32cm^2 area and 0.864cm^3 volume. There is Fat Layer (Subcutaneous Tissue) exposed. There is no undermining noted, however, there is tunneling at 3:00 with a maximum distance of 2.1cm. There is a medium amount of serosanguineous drainage noted. The  wound margin is distinct with the outline attached to the wound base. There is large (67-100%) red, pink, hyper - granulation within the wound bed. There is no necrotic tissue within the wound bed. Assessment Active Problems ICD-10 Non-pressure chronic ulcer of other part of left lower leg with fat layer exposed Type 2 diabetes mellitus with other skin ulcer Essential (primary) hypertension Secondary infectious iridocyclitis, left eye Patient's wound has shown improvement in size on the surface however there continues to be tunneling to the 3 o'clock position. We will continue with gentamicin to this area T address any bioburden along with Hydrofera Blue throughout the wound bed under compression. I used silver nitrate to the hyper o granulated areas. No signs of infection on exam. Plan Follow-up Appointments: Return Appointment in 1 week. -  Friday with Dr. Heber [place] Bathing/ Shower/ Hygiene: May shower with protection but do not get wound dressing(s) wet. Edema Control - Lymphedema / SCD / Other: Elevate legs to the level of the heart or above for 30 minutes daily and/or when sitting, a frequency of: - throughout the day Avoid standing for long periods of time. WOUND #1: - Lower Leg Wound Laterality: Left, Anterior Cleanser: Soap and Water 1 x Per Week/15 Days Discharge Instructions: May shower and wash wound with dial antibacterial soap and water prior to dressing change. Cleanser: Wound Cleanser (Generic) 1 x Per Week/15 Days Discharge Instructions: Cleanse the wound with wound cleanser prior to applying a clean dressing using gauze sponges, not tissue or cotton balls. Peri-Wound Care: Sween Lotion (Moisturizing lotion) 1 x Per Week/15 Days Discharge Instructions: Apply moisturizing lotion as directed Topical: Gentamicin 1 x Per Week/15 Days Discharge Instructions: As directed by physician Prim Dressing: Hydrofera Blue Classic Foam, 4x4 in (Generic) 1 x Per Week/15 Days ary Discharge  Instructions: Moisten with saline prior to applying to wound bed Secondary Dressing: Woven Gauze Sponge, Non-Sterile 4x4 in (Generic) 1 x Per Week/15 Days Discharge Instructions: Apply over primary dressing as directed. Secondary Dressing: ABD Pad, 5x9 (Generic) 1 x Per Week/15 Days Discharge Instructions: Apply over primary dressing as directed. Com pression Wrap: Kerlix Roll 4.5x3.1 (in/yd) 1 x Per Week/15 Days Discharge Instructions: Apply Kerlix and Coban compression as directed. Com pression Wrap: Coban Self-Adherent Wrap 4x5 (in/yd) 1 x Per Week/15 Days Discharge Instructions: Apply over Kerlix as directed. 1. Silver nitrate 2. Hydrofera Blue and gentamicin under Kerlix/Coban 3. Follow-up in 1 week Electronic Signature(s) Signed: 05/31/2021 9:19:59 AM By: Kalman Shan DO Entered By: Kalman Shan on 05/31/2021 09:19:33 -------------------------------------------------------------------------------- HxROS Details Patient Name: Date of Service: Larry Welch, [name] 05/31/2021 8:30 A M Medical Record Number: [id number] Patient Account Number: [id number] Date of Birth/Sex: Treating RN: June 24, 1945 (69 y.o. [name] Primary Care Provider: Elyn Peers Other Clinician: Referring Provider: Treating Provider/Extender: Arnette Schaumann Weeks in Treatment: 7 Information Obtained From Patient Chart Cardiovascular Medical History: Positive for: Hypertension Negative for: Angina; Arrhythmia; Congestive Heart Failure; Coronary Artery Disease; Deep Vein Thrombosis; Hypotension; Myocardial Infarction; Peripheral Arterial Disease; Peripheral Venous Disease; Phlebitis; Vasculitis Past Medical History Notes: hyperlipdemia Endocrine Medical History: Past Medical History Notes: pre0diabetic Integumentary (Skin) Medical History: Negative for: History of Burn Oncologic Medical History: Past Medical History Notes: hx of prostate  ca Immunizations Pneumococcal Vaccine: Received Pneumococcal Vaccination: No Implantable Devices None Hospitalization / Surgery History Type of Hospitalization/Surgery back surgery/foot surgery/ radiation plant sreed Family and Social History Unknown History: Yes; Never smoker; Marital Status - Married; Alcohol Use: Rarely; Drug Use: No History; Caffeine Use: Rarely; Financial Concerns: No; Food, Clothing or Shelter Needs: No; Support System Lacking: No; Transportation Concerns: No Electronic Signature(s) Signed: 05/31/2021 9:19:59 AM By: Kalman Shan DO Signed: 05/31/2021 4:57:12 PM By: Levan Hurst RN, BSN Entered By: Kalman Shan on 05/31/2021 09:15:09 -------------------------------------------------------------------------------- [place] Details Patient Name: Date of Service: Larry Welch RLES E. 05/31/2021 Medical Record Number: [id number] Patient Account Number: [id number] Date of Birth/Sex: Treating RN: [birth year] (18 y.o. [name] Primary Care Provider: Elyn Peers Other Clinician: Referring Provider: Treating Provider/Extender: Arnette Schaumann Weeks in Treatment: 7 Diagnosis Coding ICD-10 Codes Code Description [phone] Non-pressure chronic ulcer of other part of left lower leg with fat layer exposed E11.622 Type 2 diabetes mellitus with other skin ulcer I10 Essential (primary) hypertension H20.032 Secondary infectious  iridocyclitis, left eye Facility Procedures CPT4 Code: [id number] Description: 59136 - WOUND CARE VISIT-LEV 3 EST PT Modifier: Quantity: 1 Physician Procedures : CPT4 Code Description Modifier [id number] - WC PHYS LEVEL 3 - EST PT ICD-10 Diagnosis Description L97.822 Non-pressure chronic ulcer of other part of left lower leg with fat layer exposed E11.622 Type 2 diabetes mellitus with other skin ulcer I10  Essential (primary) hypertension 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

## 2021-06-08 NOTE — Progress Notes (Signed)
DE, JAWORSKI ([id number]) Visit Report for 06/08/2021 Chief Complaint Document Details Patient Name: Date of Service: Larry Welch RLES E. 06/08/2021 8:30 A M Medical Record Number: [id number] Patient Account Number: [id number] Date of Birth/Sex: Treating RN: 07-22-1945 (75 y.o. Larry Welch Primary Care Provider: Elyn Peers Other Clinician: Referring Provider: Treating Provider/Extender: Arnette Schaumann Weeks in Treatment: 9 Information Obtained from: Patient Chief Complaint Left lower extremity wound Electronic Signature(s) Signed: 06/08/2021 12:13:32 PM By: Kalman Shan DO Entered By: Kalman Shan on 06/08/2021 12:07:55 -------------------------------------------------------------------------------- HPI Details Patient Name: Date of Service: Larry Welch RLES E. 06/08/2021 8:30 A M Medical Record Number: [id number] Patient Account Number: [id number] Date of Birth/Sex: Treating RN: 1945/07/28 (39 y.o. Larry Welch Primary Care Provider: Elyn Peers Other Clinician: Referring Provider: Treating Provider/Extender: Arnette Schaumann Weeks in Treatment: 9 History of Present Illness HPI Description: Admission 04/06/2021 Mr. Anders Hohmann is a 75 year old male with a past medical history of type 2 diabetes on oral medications, essential hypertension and OSA who presents to the clinic for a 12-month history of nonhealing wound to his left lower extremity. He states that he developed a wound from a pressure washer in the beginning of September. He states that the wound had improved to the point that it almost closed and then became significantly worse in the past couple weeks. He visited the ED on 04/04/2021 for this issue and was started on doxycycline. He currently denies signs of infection. 11/3; patient presents for follow-up. He has been using Hydrofera Blue without issues. He has no issues or complaints today. He denies signs of  infection. He does not have pain to the wound site. 11/10; patient presents for follow-up. He has no issues or complaints today. He tolerated the compression wrap well. He denies signs of infection. 11/17; patient presents for 1 week follow-up. He has tolerated the compression wrap well. He has no issues or complaints today and denies signs of infection. 12/1; patient presents for follow-up. He has no issues or complaints today. He denies signs of infection. 12/8; patient presents for follow-up. He has no issues or complaints today. He denies signs of infection. 12/15; patient presents for follow-up. He has no issues or complaints today. 12/22; patient presents for follow-up. He is tolerated the compression wrap well. He denies signs of infection. 12/30; patient presents for follow-up. He has no issues or complaints today. He denies signs of infection. Electronic Signature(s) Signed: 06/08/2021 12:13:32 PM By: Kalman Shan DO Entered By: Kalman Shan on 06/08/2021 12:08:15 -------------------------------------------------------------------------------- Chemical Cauterization Details Patient Name: Date of Service: Larry Welch RLES E. 06/08/2021 8:30 A M Medical Record Number: [id number] Patient Account Number: [id number] Date of Birth/Sex: Treating RN: [birth year] (89 y.o. Larry Welch Primary Care Provider: Elyn Peers Other Clinician: Referring Provider: Treating Provider/Extender: Arnette Schaumann Weeks in Treatment: 9 Procedure Performed for: Wound #1 Left,Anterior Lower Leg Performed By: Physician Kalman Shan, DO Post Procedure Diagnosis Same as Pre-procedure Notes silver nitrate used. Electronic Signature(s) Signed: 06/08/2021 12:13:32 PM By: Kalman Shan DO Signed: 06/08/2021 1:16:39 PM By: Deon Pilling RN, BSN Entered By: Deon Pilling on 06/08/2021  08:57:36 -------------------------------------------------------------------------------- Physical Exam Details Patient Name: Date of Service: Larry Welch RLES E. 06/08/2021 8:30 A M Medical Record Number: [id number] Patient Account Number: [id number] Date of Birth/Sex: Treating RN: 1945/07/08 (72 y.o. Larry Welch Primary Care Provider: Elyn Peers Other Clinician: Referring Provider: Treating Provider/Extender: Emily Filbert,  Veita J Weeks in Treatment: 9 Constitutional respirations regular, non-labored and within target range for patient.. Cardiovascular 2+ dorsalis pedis/posterior tibialis pulses. Psychiatric pleasant and cooperative. Notes Left lower extremity: Open wound to the anterior aspect with hyper granulated tissue and undermining from 3-5. No signs of infection. No drainage noted. Good edema control. Electronic Signature(s) Signed: 06/08/2021 12:13:32 PM By: Kalman Shan DO Entered By: Kalman Shan on 06/08/2021 12:09:17 -------------------------------------------------------------------------------- Physician Orders Details Patient Name: Date of Service: Larry Welch, [name] 06/08/2021 8:30 A M Medical Record Number: [id number] Patient Account Number: [id number] Date of Birth/Sex: Treating RN: Jun 15, 1945 (75 y.o. Larry Welch, Meta.Reding Primary Care Provider: Elyn Peers Other Clinician: Referring Provider: Treating Provider/Extender: Arnette Schaumann Weeks in Treatment: 9 Verbal / Phone Orders: No Diagnosis Coding ICD-10 Coding Code Description [phone] Non-pressure chronic ulcer of other part of left lower leg with fat layer exposed E11.622 Type 2 diabetes mellitus with other skin ulcer I10 Essential (primary) hypertension H20.032 Secondary infectious iridocyclitis, left eye Follow-up Appointments ppointment in 2 weeks. - Dr. Heber [place] Thursday 06/21/2021 Return A Nurse Visit: - Thursday 06/14/2021 Bathing/ Shower/  Hygiene May shower with protection but do not get wound dressing(s) wet. Edema Control - Lymphedema / SCD / Other Elevate legs to the level of the heart or above for 30 minutes daily and/or when sitting, a frequency of: - throughout the day. Avoid standing for long periods of time. Exercise regularly Wound Treatment Wound #1 - Lower Leg Wound Laterality: Left, Anterior Cleanser: Soap and Water Discharge Instructions: May shower and wash wound with dial antibacterial soap and water prior to dressing change. Cleanser: Wound Cleanser Discharge Instructions: Cleanse the wound with wound cleanser prior to applying a clean dressing using gauze sponges, not tissue or cotton balls. Peri-Wound Care: Sween Lotion (Moisturizing lotion) Discharge Instructions: Apply moisturizing lotion as directed Topical: Gentamicin Discharge Instructions: apply under the hydrofera blue. Prim Dressing: Hydrofera Blue Classic Foam, 2x2 in ary Discharge Instructions: Moisten with saline prior to applying to wound bed Secondary Dressing: Woven Gauze Sponge, Non-Sterile 4x4 in Discharge Instructions: Apply over primary dressing as directed. Secondary Dressing: ABD Pad, 8x10 Discharge Instructions: Apply over primary dressing as directed. Compression Wrap: Kerlix Roll 4.5x3.1 (in/yd) Discharge Instructions: Apply Kerlix and Coban compression as directed. Compression Wrap: Coban Self-Adherent Wrap 4x5 (in/yd) Discharge Instructions: Apply over Kerlix as directed. Electronic Signature(s) Signed: 06/08/2021 12:13:32 PM By: Kalman Shan DO Entered By: Kalman Shan on 06/08/2021 12:09:36 -------------------------------------------------------------------------------- Problem List Details Patient Name: Date of Service: Larry Welch RLES E. 06/08/2021 8:30 A M Medical Record Number: [id number] Patient Account Number: [id number] Date of Birth/Sex: Treating RN: Feb 20, 1946 (48 y.o. Larry Welch Primary Care  Provider: Elyn Peers Other Clinician: Referring Provider: Treating Provider/Extender: Arnette Schaumann Weeks in Treatment: 9 Active Problems ICD-10 Encounter Code Description Active Date MDM Diagnosis [phone] Non-pressure chronic ulcer of other part of left lower leg with fat layer 04/06/2021 No Yes exposed E11.622 Type 2 diabetes mellitus with other skin ulcer 04/19/2021 No Yes I10 Essential (primary) hypertension 04/06/2021 No Yes H20.032 Secondary infectious iridocyclitis, left eye 04/12/2021 No Yes Inactive Problems Resolved Problems Electronic Signature(s) Signed: 06/08/2021 12:13:32 PM By: Kalman Shan DO Entered By: Kalman Shan on 06/08/2021 12:07:36 -------------------------------------------------------------------------------- Progress Note Details Patient Name: Date of Service: Larry Welch RLES E. 06/08/2021 8:30 A M Medical Record Number: [id number] Patient Account Number: [id number] Date of Birth/Sex: Treating RN: 10/27/1945 (75 y.o. Larry Welch Primary Care Provider: Lucianne Lei  J Other Clinician: Referring Provider: Treating Provider/Extender: Arnette Schaumann Weeks in Treatment: 9 Subjective Chief Complaint Information obtained from Patient Left lower extremity wound History of Present Illness (HPI) Admission 04/06/2021 Mr. Phyllis Whitefield is a 75 year old male with a past medical history of type 2 diabetes on oral medications, essential hypertension and OSA who presents to the clinic for a 66-month history of nonhealing wound to his left lower extremity. He states that he developed a wound from a pressure washer in the beginning of September. He states that the wound had improved to the point that it almost closed and then became significantly worse in the past couple weeks. He visited the ED on 04/04/2021 for this issue and was started on doxycycline. He currently denies signs of infection. 11/3; patient  presents for follow-up. He has been using Hydrofera Blue without issues. He has no issues or complaints today. He denies signs of infection. He does not have pain to the wound site. 11/10; patient presents for follow-up. He has no issues or complaints today. He tolerated the compression wrap well. He denies signs of infection. 11/17; patient presents for 1 week follow-up. He has tolerated the compression wrap well. He has no issues or complaints today and denies signs of infection. 12/1; patient presents for follow-up. He has no issues or complaints today. He denies signs of infection. 12/8; patient presents for follow-up. He has no issues or complaints today. He denies signs of infection. 12/15; patient presents for follow-up. He has no issues or complaints today. 12/22; patient presents for follow-up. He is tolerated the compression wrap well. He denies signs of infection. 12/30; patient presents for follow-up. He has no issues or complaints today. He denies signs of infection. Patient History Information obtained from Patient, Chart. Family History Unknown History. Social History Never smoker, Marital Status - Married, Alcohol Use - Rarely, Drug Use - No History, Caffeine Use - Rarely. Medical History Cardiovascular Patient has history of Hypertension Denies history of Angina, Arrhythmia, Congestive Heart Failure, Coronary Artery Disease, Deep Vein Thrombosis, Hypotension, Myocardial Infarction, Peripheral Arterial Disease, Peripheral Venous Disease, Phlebitis, Vasculitis Integumentary (Skin) Denies history of History of Burn Hospitalization/Surgery History - back surgery/foot surgery/ radiation plant sreed. Medical A Surgical History Notes nd Cardiovascular hyperlipdemia Endocrine pre0diabetic Oncologic hx of prostate ca Objective Constitutional respirations regular, non-labored and within target range for patient.. Vitals Time Taken: 8:32 AM, Height: 72 in, Weight: 195 lbs,  BMI: 26.4, Temperature: 97.8 F, Pulse: 65 bpm, Respiratory Rate: 16 breaths/min, Blood Pressure: 108/74 mmHg. Cardiovascular 2+ dorsalis pedis/posterior tibialis pulses. Psychiatric pleasant and cooperative. General Notes: Left lower extremity: Open wound to the anterior aspect with hyper granulated tissue and undermining from 3-5. No signs of infection. No drainage noted. Good edema control. Integumentary (Hair, Skin) Wound #1 status is Open. Original cause of wound was Trauma. The date acquired was: 02/08/2021. The wound has been in treatment 9 weeks. The wound is located on the Left,Anterior Lower Leg. The wound measures 2.7cm length x 2.5cm width x 0.3cm depth; 5.301cm^2 area and 1.59cm^3 volume. There is Fat Layer (Subcutaneous Tissue) exposed. There is no tunneling noted, however, there is undermining starting at 2:00 and ending at 4:00 with a maximum distance of 0.4cm. There is a medium amount of serosanguineous drainage noted. The wound margin is distinct with the outline attached to the wound base. There is large (67-100%) red, pink, friable, hyper - granulation within the wound bed. There is no necrotic tissue within the wound bed. Assessment  Active Problems ICD-10 Non-pressure chronic ulcer of other part of left lower leg with fat layer exposed Type 2 diabetes mellitus with other skin ulcer Essential (primary) hypertension Secondary infectious iridocyclitis, left eye Patient's wound has shown improvement in the area that had tunneling. It has come in more and now there is undermining. No signs of infection on exam. I used silver nitrate to the hyper granulated areas. I recommended continuing gentamicin and Hydrofera Blue under compression. Procedures Wound #1 Pre-procedure diagnosis of Wound #1 is a Trauma, Other located on the Left,Anterior Lower Leg . An Chemical Cauterization procedure was performed by Kalman Shan, DO. Post procedure Diagnosis Wound #1: Same as  Pre-Procedure Notes: silver nitrate used. Plan Follow-up Appointments: Return Appointment in 2 weeks. - Dr. Heber [place] Thursday 06/21/2021 Nurse Visit: - Thursday 06/14/2021 Bathing/ Shower/ Hygiene: May shower with protection but do not get wound dressing(s) wet. Edema Control - Lymphedema / SCD / Other: Elevate legs to the level of the heart or above for 30 minutes daily and/or when sitting, a frequency of: - throughout the day. Avoid standing for long periods of time. Exercise regularly WOUND #1: - Lower Leg Wound Laterality: Left, Anterior Cleanser: Soap and Water Discharge Instructions: May shower and wash wound with dial antibacterial soap and water prior to dressing change. Cleanser: Wound Cleanser Discharge Instructions: Cleanse the wound with wound cleanser prior to applying a clean dressing using gauze sponges, not tissue or cotton balls. Peri-Wound Care: Sween Lotion (Moisturizing lotion) Discharge Instructions: Apply moisturizing lotion as directed Topical: Gentamicin Discharge Instructions: apply under the hydrofera blue. Prim Dressing: Hydrofera Blue Classic Foam, 2x2 in ary Discharge Instructions: Moisten with saline prior to applying to wound bed Secondary Dressing: Woven Gauze Sponge, Non-Sterile 4x4 in Discharge Instructions: Apply over primary dressing as directed. Secondary Dressing: ABD Pad, 8x10 Discharge Instructions: Apply over primary dressing as directed. Com pression Wrap: Kerlix Roll 4.5x3.1 (in/yd) Discharge Instructions: Apply Kerlix and Coban compression as directed. Com pression Wrap: Coban Self-Adherent Wrap 4x5 (in/yd) Discharge Instructions: Apply over Kerlix as directed. 1. Hydrofera Blue and gentamicin under Kerlix/Coban 2. Silver nitrate 3. Follow-up in 1 week for nurse visit in 2 weeks with me Electronic Signature(s) Signed: 06/08/2021 12:13:32 PM By: Kalman Shan DO Entered By: Kalman Shan on 06/08/2021  12:12:51 -------------------------------------------------------------------------------- HxROS Details Patient Name: Date of Service: Larry Welch, [name] 06/08/2021 8:30 A M Medical Record Number: [id number] Patient Account Number: [id number] Date of Birth/Sex: Treating RN: [birth year] (42 y.o. Larry Welch Primary Care Provider: Elyn Peers Other Clinician: Referring Provider: Treating Provider/Extender: Arnette Schaumann Weeks in Treatment: 9 Information Obtained From Patient Chart Cardiovascular Medical History: Positive for: Hypertension Negative for: Angina; Arrhythmia; Congestive Heart Failure; Coronary Artery Disease; Deep Vein Thrombosis; Hypotension; Myocardial Infarction; Peripheral Arterial Disease; Peripheral Venous Disease; Phlebitis; Vasculitis Past Medical History Notes: hyperlipdemia Endocrine Medical History: Past Medical History Notes: pre0diabetic Integumentary (Skin) Medical History: Negative for: History of Burn Oncologic Medical History: Past Medical History Notes: hx of prostate ca Immunizations Pneumococcal Vaccine: Received Pneumococcal Vaccination: No Implantable Devices None Hospitalization / Surgery History Type of Hospitalization/Surgery back surgery/foot surgery/ radiation plant sreed Family and Social History Unknown History: Yes; Never smoker; Marital Status - Married; Alcohol Use: Rarely; Drug Use: No History; Caffeine Use: Rarely; Financial Concerns: No; Food, Clothing or Shelter Needs: No; Support System Lacking: No; Transportation Concerns: No Electronic Signature(s) Signed: 06/08/2021 12:13:32 PM By: Kalman Shan DO Signed: 06/08/2021 1:50:30 PM By: Baruch Gouty RN, BSN Entered By: Heber [place],  Roby Spalla on 06/08/2021 18:56:31 -------------------------------------------------------------------------------- SuperBill Details Patient Name: Date of Service: Orvan July 06/08/2021 Medical Record  Number: [id number] Patient Account Number: [id number] Date of Birth/Sex: Treating RN: 1946/05/20 (75 y.o. 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, left eye Facility Procedures CPT4 Code: [id number] Description: [id number] - CHEM CAUT GRANULATION TISS ICD-10 Diagnosis Description L97.822 Non-pressure chronic ulcer of other part of left lower leg with fat layer expos Modifier: ed Quantity: 1 Physician Procedures : CPT4 Code Description Modifier [id number] - WC PHYS LEVEL 3 - EST PT ICD-10 Diagnosis Description L97.822 Non-pressure chronic ulcer of other part of left lower leg with fat layer exposed E11.622 Type 2 diabetes mellitus with other skin ulcer I10  Essential (primary) hypertension H20.032 Secondary infectious iridocyclitis, left eye Quantity: 1 : [id number] - WC PHYS CHEM CAUT GRAN TISSUE ICD-10 Diagnosis Description L97.822 Non-pressure chronic ulcer of other part of left lower leg with fat layer exposed Quantity: 1 Electronic Signature(s) Signed: 06/08/2021 12:13:32 PM By: Kalman Shan DO Entered By: Kalman Shan on 06/08/2021 12:13:10

## 2021-06-08 NOTE — Progress Notes (Signed)
Larry Welch, Larry Welch ([id number]) Visit Report for 06/08/2021 Arrival Information Details Patient Name: Date of Service: Larry Welch 06/08/2021 8:30 A M Medical Record Number: [id number] Patient Account Number: [id number] Date of Birth/Sex: Treating RN: 09/15/1945 (75 y.o. Collene Gobble Primary Care Khori Rosevear: Elyn Peers Other Clinician: Referring Esmae Donathan: Treating Marl Seago/Extender: Arnette Schaumann Weeks in Treatment: 9 Visit Information History Since Last Visit Added or deleted any medications: No Patient Arrived: Ambulatory Any new allergies or adverse reactions: No Arrival Time: 08:30 Had a fall or experienced change in No Accompanied By: self activities of daily living that may affect Transfer Assistance: None risk of falls: Patient Identification Verified: Yes Signs or symptoms of abuse/neglect since last visito No Patient Requires Transmission-Based Precautions: No Hospitalized since last visit: No Patient Has Alerts: Yes Implantable device outside of the clinic excluding No Patient Alerts: R ABI: 1.2 TBI: 0.88 cellular tissue based products placed in the center L ABI: 1.22 TBI: 1.07 since last visit: Has Dressing in Place as Prescribed: Yes Pain Present Now: No Electronic Signature(s) Signed: 06/08/2021 12:44:27 PM By: Dellie Catholic RN Entered By: Dellie Catholic on 06/08/2021 08:31:17 -------------------------------------------------------------------------------- Encounter Discharge Information Details Patient Name: Date of Service: Larry Sickles RLES E. 06/08/2021 8:30 A M Medical Record Number: [id number] Patient Account Number: [id number] Date of Birth/Sex: Treating RN: 04-Feb-1946 (75 y.o. [name] Primary Care Marks Scalera: Elyn Peers Other Clinician: Referring Ramiel Forti: Treating Belton Peplinski/Extender: Jeannine Boga in Treatment: 9 Encounter Discharge Information Items Discharge Condition:  Stable Ambulatory Status: Ambulatory Discharge Destination: Home Transportation: Private Auto Accompanied By: self Schedule Follow-up Appointment: Yes Clinical Summary of Care: Electronic Signature(s) Signed: 06/08/2021 1:16:39 PM By: Deon Pilling RN, BSN Entered By: Deon Pilling on 06/08/2021 09:03:49 -------------------------------------------------------------------------------- Lower Extremity Assessment Details Patient Name: Date of Service: Larry Sickles RLES E. 06/08/2021 8:30 A M Medical Record Number: [id number] Patient Account Number: [id number] Date of Birth/Sex: Treating RN: 12-13-1945 (75 y.o. Collene Gobble Primary Care Thedore Pickel: Elyn Peers Other Clinician: Referring Macklen Wilhoite: Treating Veverly Larimer/Extender: Arnette Schaumann Weeks in Treatment: 9 Edema Assessment Assessed: [Left: No] [Right: No] Edema: [Left: Ye] [Right: s] Calf Left: Right: Point of Measurement: 41 cm From Medial Instep 37.5 cm Ankle Left: Right: Point of Measurement: 8 cm From Medial Instep 25 cm Electronic Signature(s) Signed: 06/08/2021 12:44:27 PM By: Dellie Catholic RN Entered By: Dellie Catholic on 06/08/2021 08:42:53 -------------------------------------------------------------------------------- Multi Wound Chart Details Patient Name: Date of Service: Larry Sickles RLES E. 06/08/2021 8:30 A M Medical Record Number: [id number] Patient Account Number: [id number] Date of Birth/Sex: Treating RN: 1946/02/04 (75 y.o. Ernestene Mention Primary Care Snigdha Howser: Elyn Peers Other Clinician: Referring Sonjia Wilcoxson: Treating Lorely Bubb/Extender: Arnette Schaumann Weeks in Treatment: 9 Vital Signs Height(in): 72 Pulse(bpm): 65 Weight(lbs): 195 Blood Pressure(mmHg): 108/74 Body Mass Index(BMI): 26 Temperature(F): 97.8 Respiratory Rate(breaths/min): 16 Photos: [1:No Photos Left, Anterior Lower Leg] [N/A:N/A N/A] Wound Location: [1:Trauma] [N/A:N/A] Wounding  Event: [1:Trauma, Other] [N/A:N/A] Primary Etiology: [1:Hypertension] [N/A:N/A] Comorbid History: [1:02/08/2021] [N/A:N/A] Date Acquired: [1:9] [N/A:N/A] Weeks of Treatment: [1:Open] [N/A:N/A] Wound Status: [1:Yes] [N/A:N/A] Clustered Wound: [1:2.7x2.5x0.3] [N/A:N/A] Measurements L Welch W Welch D (cm) [1:5.301] [N/A:N/A] A (cm) : rea [1:1.59] [N/A:N/A] Volume (cm) : [1:48.90%] [N/A:N/A] % Reduction in A rea: [1:87.20%] [N/A:N/A] % Reduction in Volume: [1:2] Starting Position 1 (o'clock): [1:4] Ending Position 1 (o'clock): [1:0.4] Maximum Distance 1 (cm): [1:Yes] [N/A:N/A] Undermining: [1:Full Thickness Without Exposed] [N/A:N/A] Classification: [1:Support Structures Medium] [  N/A:N/A] Exudate Amount: [1:Serosanguineous] [N/A:N/A] Exudate Type: [1:red, brown] [N/A:N/A] Exudate Color: [1:Distinct, outline attached] [N/A:N/A] Wound Margin: [1:Large (67-100%)] [N/A:N/A] Granulation Amount: [1:Red, Pink, Hyper-granulation, Friable N/A] Granulation Quality: [1:None Present (0%)] [N/A:N/A] Necrotic Amount: [1:Fat Layer (Subcutaneous Tissue): Yes N/A] Exposed Structures: [1:Fascia: No Tendon: No Muscle: No Joint: No Bone: No Small (1-33%)] [N/A:N/A] Epithelialization: [1:Chemical Cauterization] [N/A:N/A] Treatment Notes Wound #1 (Lower Leg) Wound Laterality: Left, Anterior Cleanser Soap and Water Discharge Instruction: May shower and wash wound with dial antibacterial soap and water prior to dressing change. Wound Cleanser Discharge Instruction: Cleanse the wound with wound cleanser prior to applying a clean dressing using gauze sponges, not tissue or cotton balls. Peri-Wound Care Sween Lotion (Moisturizing lotion) Discharge Instruction: Apply moisturizing lotion as directed Topical Gentamicin Discharge Instruction: apply under the hydrofera blue. Primary Dressing Hydrofera Blue Classic Foam, 2x2 in Discharge Instruction: Moisten with saline prior to applying to wound bed Secondary  Dressing Woven Gauze Sponge, Non-Sterile 4x4 in Discharge Instruction: Apply over primary dressing as directed. ABD Pad, 8x10 Discharge Instruction: Apply over primary dressing as directed. Secured With Compression Wrap Kerlix Roll 4.5x3.1 (in/yd) Discharge Instruction: Apply Kerlix and Coban compression as directed. Coban Self-Adherent Wrap 4x5 (in/yd) Discharge Instruction: Apply over Kerlix as directed. Compression Stockings Add-Ons Electronic Signature(s) Signed: 06/08/2021 12:13:32 PM By: Kalman Shan DO Signed: 06/08/2021 1:50:30 PM By: Baruch Gouty RN, BSN Entered By: Kalman Shan on 06/08/2021 12:07:43 -------------------------------------------------------------------------------- Multi-Disciplinary Care Plan Details Patient Name: Date of Service: Larry Welch, Larry Welch 06/08/2021 8:30 A M Medical Record Number: [id number] Patient Account Number: [id number] Date of Birth/Sex: Treating RN: June 05, 1946 (75 y.o. [name] Primary Care Kristell Wooding: Elyn Peers Other Clinician: Referring Stetson Pelaez: Treating Cristie Mckinney/Extender: Arnette Schaumann Weeks in Treatment: 9 Active Inactive Wound/Skin Impairment Nursing Diagnoses: Impaired tissue integrity Knowledge deficit related to ulceration/compromised skin integrity Goals: Patient/caregiver will verbalize understanding of skin care regimen Date Initiated: 04/06/2021 Target Resolution Date: 07/06/2021 Goal Status: Active Ulcer/skin breakdown will have a volume reduction of 30% by week 4 Date Initiated: 04/06/2021 Date Inactivated: 05/10/2021 Target Resolution Date: 05/10/2021 Goal Status: Met Ulcer/skin breakdown will have a volume reduction of 50% by week 8 Date Initiated: 04/06/2021 Target Resolution Date: 07/06/2021 Goal Status: Active Interventions: Assess patient/caregiver ability to obtain necessary supplies Assess patient/caregiver ability to perform ulcer/skin care regimen upon admission  and as needed Assess ulceration(s) every visit Notes: Electronic Signature(s) Signed: 06/08/2021 1:16:39 PM By: Deon Pilling RN, BSN Entered By: Deon Pilling on 06/08/2021 08:56:34 -------------------------------------------------------------------------------- Pain Assessment Details Patient Name: Date of Service: Larry Sickles RLES E. 06/08/2021 8:30 A M Medical Record Number: [id number] Patient Account Number: [id number] Date of Birth/Sex: Treating RN: 1946-02-01 (75 y.o. Collene Gobble Primary Care Trana Ressler: Elyn Peers Other Clinician: Referring Homer Pfeifer: Treating Malek Skog/Extender: Arnette Schaumann Weeks in Treatment: 9 Active Problems Location of Pain Severity and Description of Pain Patient Has Paino No Site Locations Pain Management and Medication Current Pain Management: Electronic Signature(s) Signed: 06/08/2021 12:44:27 PM By: Dellie Catholic RN Entered By: Dellie Catholic on 06/08/2021 08:34:22 -------------------------------------------------------------------------------- Patient/Caregiver Education Details Patient Name: Date of Service: Larry Welch 12/30/2022andnbsp8:30 A M Medical Record Number: [id number] Patient Account Number: [id number] Date of Birth/Gender: Treating RN: June 12, 1945 (58 y.o. [name] Primary Care Physician: Elyn Peers Other Clinician: Referring Physician: Treating Physician/Extender: Jeannine Boga in Treatment: 9 Education Assessment Education Provided To: Patient Education Topics Provided Wound/Skin Impairment: Handouts: Skin Care Do's and  Dont's Methods: Explain/Verbal Responses: Reinforcements needed Electronic Signature(s) Signed: 06/08/2021 1:16:39 PM By: Deon Pilling RN, BSN Entered By: Deon Pilling on 06/08/2021 08:56:50 -------------------------------------------------------------------------------- Wound Assessment Details Patient Name: Date of  Service: Larry Sickles RLES E. 06/08/2021 8:30 A M Medical Record Number: [id number] Patient Account Number: [id number] Date of Birth/Sex: Treating RN: 1945-07-15 (75 y.o. Collene Gobble Primary Care Ladislaus Repsher: Elyn Peers Other Clinician: Referring Laylanie Kruczek: Treating Jovonne Wilton/Extender: Arnette Schaumann Weeks in Treatment: 9 Wound Status Wound Number: 1 Primary Etiology: Trauma, Other Wound Location: Left, Anterior Lower Leg Wound Status: Open Wounding Event: Trauma Comorbid History: Hypertension Date Acquired: 02/08/2021 Weeks Of Treatment: 9 Clustered Wound: Yes Wound Measurements Length: (cm) 2.7 Width: (cm) 2.5 Depth: (cm) 0.3 Area: (cm) 5.301 Volume: (cm) 1.59 % Reduction in Area: 48.9% % Reduction in Volume: 87.2% Epithelialization: Small (1-33%) Tunneling: No Undermining: Yes Starting Position (o'clock): 2 Ending Position (o'clock): 4 Maximum Distance: (cm) 0.4 Wound Description Classification: Full Thickness Without Exposed Support Structures Wound Margin: Distinct, outline attached Exudate Amount: Medium Exudate Type: Serosanguineous Exudate Color: red, brown Foul Odor After Cleansing: No Slough/Fibrino No Wound Bed Granulation Amount: Large (67-100%) Exposed Structure Granulation Quality: Red, Pink, Hyper-granulation, Friable Fascia Exposed: No Necrotic Amount: None Present (0%) Fat Layer (Subcutaneous Tissue) Exposed: Yes Tendon Exposed: No Muscle Exposed: No Joint Exposed: No Bone Exposed: No Treatment Notes Wound #1 (Lower Leg) Wound Laterality: Left, Anterior Cleanser Soap and Water Discharge Instruction: May shower and wash wound with dial antibacterial soap and water prior to dressing change. Wound Cleanser Discharge Instruction: Cleanse the wound with wound cleanser prior to applying a clean dressing using gauze sponges, not tissue or cotton balls. Peri-Wound Care Sween Lotion (Moisturizing lotion) Discharge Instruction:  Apply moisturizing lotion as directed Topical Gentamicin Discharge Instruction: apply under the hydrofera blue. Primary Dressing Hydrofera Blue Classic Foam, 2x2 in Discharge Instruction: Moisten with saline prior to applying to wound bed Secondary Dressing Woven Gauze Sponge, Non-Sterile 4x4 in Discharge Instruction: Apply over primary dressing as directed. ABD Pad, 8x10 Discharge Instruction: Apply over primary dressing as directed. Secured With Compression Wrap Kerlix Roll 4.5x3.1 (in/yd) Discharge Instruction: Apply Kerlix and Coban compression as directed. Coban Self-Adherent Wrap 4x5 (in/yd) Discharge Instruction: Apply over Kerlix as directed. Compression Stockings Add-Ons Electronic Signature(s) Signed: 06/08/2021 12:44:27 PM By: Dellie Catholic RN Signed: 06/08/2021 1:16:39 PM By: Deon Pilling RN, BSN Entered By: Deon Pilling on 06/08/2021 08:49:05 -------------------------------------------------------------------------------- Vitals Details Patient Name: Date of Service: Larry Welch, Larry RLES E. 06/08/2021 8:30 A M Medical Record Number: [id number] Patient Account Number: [id number] Date of Birth/Sex: Treating RN: 10/07/1945 (75 y.o. Collene Gobble Primary Care Railee Bonillas: Elyn Peers Other Clinician: Referring Lineth Thielke: Treating Norris Brumbach/Extender: Arnette Schaumann Weeks in Treatment: 9 Vital Signs Time Taken: 08:32 Temperature (F): 97.8 Height (in): 72 Pulse (bpm): 65 Weight (lbs): 195 Respiratory Rate (breaths/min): 16 Body Mass Index (BMI): 26.4 Blood Pressure (mmHg): 108/74 Reference Range: 80 - 120 mg / dl Electronic Signature(s) Signed: 06/08/2021 12:44:27 PM By: Dellie Catholic RN Entered By: Dellie Catholic on 06/08/2021 08:34:09

## 2021-06-14 ENCOUNTER — Encounter (HOSPITAL_BASED_OUTPATIENT_CLINIC_OR_DEPARTMENT_OTHER): Payer: Medicare PPO | Attending: Internal Medicine | Admitting: Internal Medicine

## 2021-06-14 ENCOUNTER — Other Ambulatory Visit: Payer: Self-pay

## 2021-06-14 DIAGNOSIS — E11622 Type 2 diabetes mellitus with other skin ulcer: Secondary | ICD-10-CM | POA: Diagnosis not present

## 2021-06-14 DIAGNOSIS — G4733 Obstructive sleep apnea (adult) (pediatric): Secondary | ICD-10-CM | POA: Diagnosis not present

## 2021-06-14 DIAGNOSIS — Z7984 Long term (current) use of oral hypoglycemic drugs: Secondary | ICD-10-CM | POA: Insufficient documentation

## 2021-06-14 DIAGNOSIS — I10 Essential (primary) hypertension: Secondary | ICD-10-CM | POA: Insufficient documentation

## 2021-06-14 DIAGNOSIS — L97822 Non-pressure chronic ulcer of other part of left lower leg with fat layer exposed: Secondary | ICD-10-CM | POA: Diagnosis not present

## 2021-06-14 DIAGNOSIS — H20032 Secondary infectious iridocyclitis, left eye: Secondary | ICD-10-CM | POA: Insufficient documentation

## 2021-06-14 NOTE — Progress Notes (Signed)
NICKOLIS, DIEL ([id number]) Visit Report for 06/14/2021 SuperBill Details Patient Name: Date of Service: Orvan July 06/14/2021 Medical Record Number: [id number] Patient Account Number: [id number] Date of Birth/Sex: Treating RN: 11-18-1945 (76 y.o. 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

## 2021-06-14 NOTE — Progress Notes (Signed)
Larry, Welch ([id number]) Visit Report for 06/14/2021 Arrival Information Details Patient Name: Date of Service: Larry Welch July 06/14/2021 8:45 A M Medical Record Number: [id number] Patient Account Number: [id number] Date of Birth/Sex: Treating RN: 01/21/1946 (76 y.o. [name] Primary Care Lynna Zamorano: Elyn Peers Other Clinician: Referring [name]: Treating Lyvia Mondesir/Extender: Arnette Schaumann Weeks in Treatment: 9 Visit Information History Since Last Visit Added or deleted any medications: No Patient Arrived: Ambulatory Any new allergies or adverse reactions: No Arrival Time: 08:53 Had a fall or experienced change in No Transfer Assistance: None activities of daily living that may affect Patient Identification Verified: Yes risk of falls: Secondary Verification Process Completed: Yes Signs or symptoms of abuse/neglect since last visito No Patient Requires Transmission-Based Precautions: No Hospitalized since last visit: No Patient Has Alerts: Yes Implantable device outside of the clinic excluding No Patient Alerts: R ABI: 1.2 TBI: 0.88 cellular tissue based products placed in the center L ABI: 1.22 TBI: 1.07 since last visit: Has Dressing in Place as Prescribed: Yes Has Compression in Place as Prescribed: Yes Pain Present Now: No Electronic Signature(s) Signed: 06/14/2021 5:38:48 PM By: Lorrin Jackson Entered By: Lorrin Jackson on 06/14/2021 08:53:50 -------------------------------------------------------------------------------- Clinic Level of Care Assessment Details Patient Name: Date of Service: Larry Sickles RLES E. 06/14/2021 8:45 A M Medical Record Number: [id number] Patient Account Number: [id number] Date of Birth/Sex: Treating RN: 1945/10/28 (76 y.o. [name] Primary Care Bennett Vanscyoc: Elyn Peers Other Clinician: Referring Mayco Walrond: Treating [name]: Arnette Schaumann Weeks in Treatment: 9 Clinic Level of  Care Assessment Items TOOL 4 Quantity Score X- 1 0 Use when only an EandM is performed on FOLLOW-UP visit ASSESSMENTS - Nursing Assessment / Reassessment X- 1 10 Reassessment of Co-morbidities (includes updates in patient status) X- 1 5 Reassessment of Adherence to Treatment Plan ASSESSMENTS - Wound and Skin A ssessment / Reassessment X - Simple Wound Assessment / Reassessment - one wound 1 5 []  - 0 Complex Wound Assessment / Reassessment - multiple wounds []  - 0 Dermatologic / Skin Assessment (not related to wound area) ASSESSMENTS - Focused Assessment []  - 0 Circumferential Edema Measurements - multi extremities []  - 0 Nutritional Assessment / Counseling / Intervention []  - 0 Lower Extremity Assessment (monofilament, tuning fork, pulses) []  - 0 Peripheral Arterial Disease Assessment (using hand held doppler) ASSESSMENTS - Ostomy and/or Continence Assessment and Care []  - 0 Incontinence Assessment and Management []  - 0 Ostomy Care Assessment and Management (repouching, etc.) PROCESS - Coordination of Care []  - 0 Simple Patient / Family Education for ongoing care X- 1 20 Complex (extensive) Patient / Family Education for ongoing care []  - 0 Staff obtains Programmer, systems, Records, T Results / Process Orders est []  - 0 Staff telephones HHA, Nursing Homes / Clarify orders / etc []  - 0 Routine Transfer to another Facility (non-emergent condition) []  - 0 Routine Hospital Admission (non-emergent condition) []  - 0 New Admissions / Biomedical engineer / Ordering NPWT Apligraf, etc. , []  - 0 Emergency Hospital Admission (emergent condition) []  - 0 Simple Discharge Coordination []  - 0 Complex (extensive) Discharge Coordination PROCESS - Special Needs []  - 0 Pediatric / Minor Patient Management []  - 0 Isolation Patient Management []  - 0 Hearing / Language / Visual special needs []  - 0 Assessment of Community assistance (transportation, D/C planning, etc.) []  -  0 Additional assistance / Altered mentation []  - 0 Support Surface(s) Assessment (bed, cushion, seat, etc.) INTERVENTIONS - Wound Cleansing /  Measurement X - Simple Wound Cleansing - one wound 1 5 []  - 0 Complex Wound Cleansing - multiple wounds []  - 0 Wound Imaging (photographs - any number of wounds) []  - 0 Wound Tracing (instead of photographs) X- 1 5 Simple Wound Measurement - one wound []  - 0 Complex Wound Measurement - multiple wounds INTERVENTIONS - Wound Dressings []  - 0 Small Wound Dressing one or multiple wounds []  - 0 Medium Wound Dressing one or multiple wounds X- 1 20 Large Wound Dressing one or multiple wounds []  - 0 Application of Medications - topical []  - 0 Application of Medications - injection INTERVENTIONS - Miscellaneous []  - 0 External ear exam []  - 0 Specimen Collection (cultures, biopsies, blood, body fluids, etc.) []  - 0 Specimen(s) / Culture(s) sent or taken to Lab for analysis []  - 0 Patient Transfer (multiple staff / Civil Service fast streamer / Similar devices) []  - 0 Simple Staple / Suture removal (25 or less) []  - 0 Complex Staple / Suture removal (26 or more) []  - 0 Hypo / Hyperglycemic Management (close monitor of Blood Glucose) []  - 0 Ankle / Brachial Index (ABI) - do not check if billed separately X- 1 5 Vital Signs Has the patient been seen at the hospital within the last three years: Yes Total Score: 75 Level Of Care: New/Established - Level 2 Electronic Signature(s) Signed: 06/14/2021 5:38:48 PM By: Lorrin Jackson Entered By: Lorrin Jackson on 06/14/2021 09:14:17 -------------------------------------------------------------------------------- Encounter Discharge Information Details Patient Name: Date of Service: Larry Saha, [name] 06/14/2021 8:45 A M Medical Record Number: [id number] Patient Account Number: [id number] Date of Birth/Sex: Treating RN: May 05, 1946 (76 y.o. [name] Primary Care Eulanda Dorion: Elyn Peers Other  Clinician: Referring Brady Plant: Treating Mickenzie Stolar/Extender: Arnette Schaumann Weeks in Treatment: 9 Encounter Discharge Information Items Discharge Condition: Stable Ambulatory Status: Ambulatory Discharge Destination: Home Transportation: Private Auto Schedule Follow-up Appointment: Yes Clinical Summary of Care: Provided on 06/14/2021 Form Type Recipient Paper Patient Patient Electronic Signature(s) Signed: 06/14/2021 5:38:48 PM By: Lorrin Jackson Entered By: Lorrin Jackson on 06/14/2021 09:13:36 -------------------------------------------------------------------------------- Patient/Caregiver Education Details Patient Name: Date of Service: Larry Welch July 1/5/2023andnbsp8:45 A M Medical Record Number: [id number] Patient Account Number: [id number] Date of Birth/Gender: Treating RN: [birth year] (76 y.o. [name] Primary Care Physician: Elyn Peers Other Clinician: Referring Physician: Treating Physician/Extender: Jeannine Boga in Treatment: 9 Education Assessment Education Provided To: Patient Education Topics Provided Welcome T [facility]: o Methods: Demonstration, Explain/Verbal Responses: State content correctly Wound/Skin Impairment: Methods: Demonstration, Explain/Verbal Responses: State content correctly Electronic Signature(s) Signed: 06/14/2021 5:38:48 PM By: Lorrin Jackson Entered By: Lorrin Jackson on 06/14/2021 09:13:19 -------------------------------------------------------------------------------- Wound Assessment Details Patient Name: Date of Service: Larry Sickles RLES E. 06/14/2021 8:45 A M Medical Record Number: [id number] Patient Account Number: [id number] Date of Birth/Sex: Treating RN: 03-27-1946 (76 y.o. [name] Primary Care Naava Janeway: Elyn Peers Other Clinician: Referring Sabriah Hobbins: Treating Marvin Maenza/Extender: Arnette Schaumann Weeks in Treatment: 9 Wound  Status Wound Number: 1 Primary Etiology: Trauma, Other Wound Location: Left, Anterior Lower Leg Wound Status: Open Wounding Event: Trauma Comorbid History: Hypertension Date Acquired: 02/08/2021 Weeks Of Treatment: 9 Clustered Wound: Yes Wound Measurements Length: (cm) 1.9 Width: (cm) 2.5 Depth: (cm) 0.2 Area: (cm) 3.731 Volume: (cm) 0.746 % Reduction in Area: 64% % Reduction in Volume: 94% Epithelialization: Small (1-33%) Tunneling: No Undermining: No Wound Description Classification: Full Thickness Without Exposed Support Structures Wound Margin: Distinct,  outline attached Exudate Amount: Medium Exudate Type: Serosanguineous Exudate Color: red, brown Foul Odor After Cleansing: No Slough/Fibrino No Wound Bed Granulation Amount: Large (67-100%) Exposed Structure Granulation Quality: Red, Pink, Hyper-granulation Fascia Exposed: No Necrotic Amount: None Present (0%) Fat Layer (Subcutaneous Tissue) Exposed: Yes Tendon Exposed: No Muscle Exposed: No Joint Exposed: No Bone Exposed: No Treatment Notes Wound #1 (Lower Leg) Wound Laterality: Left, Anterior Cleanser Soap and Water Discharge Instruction: May shower and wash wound with dial antibacterial soap and water prior to dressing change. Wound Cleanser Discharge Instruction: Cleanse the wound with wound cleanser prior to applying a clean dressing using gauze sponges, not tissue or cotton balls. Peri-Wound Care Sween Lotion (Moisturizing lotion) Discharge Instruction: Apply moisturizing lotion as directed Topical Gentamicin Discharge Instruction: apply under the hydrofera blue. Primary Dressing Hydrofera Blue Classic Foam, 2x2 in Discharge Instruction: Moisten with saline prior to applying to wound bed Secondary Dressing Woven Gauze Sponge, Non-Sterile 4x4 in Discharge Instruction: Apply over primary dressing as directed. ABD Pad, 8x10 Discharge Instruction: Apply over primary dressing as directed. Secured  With Compression Wrap Kerlix Roll 4.5x3.1 (in/yd) Discharge Instruction: Apply Kerlix and Coban compression as directed. Coban Self-Adherent Wrap 4x5 (in/yd) Discharge Instruction: Apply over Kerlix as directed. Compression Stockings Add-Ons Electronic Signature(s) Signed: 06/14/2021 5:38:48 PM By: Lorrin Jackson Entered By: Lorrin Jackson on 06/14/2021 09:01:21 -------------------------------------------------------------------------------- Vitals Details Patient Name: Date of Service: Larry Saha, [name] 06/14/2021 8:45 A M Medical Record Number: [id number] Patient Account Number: [id number] Date of Birth/Sex: Treating RN: 1945/10/08 (76 y.o. [name] Primary Care Dawnn Nam: Elyn Peers Other Clinician: Referring Armand Preast: Treating Berkley Cronkright/Extender: Arnette Schaumann Weeks in Treatment: 9 Vital Signs Time Taken: 08:53 Temperature (F): 97.9 Height (in): 72 Pulse (bpm): 60 Weight (lbs): 195 Respiratory Rate (breaths/min): 16 Body Mass Index (BMI): 26.4 Blood Pressure (mmHg): 126/75 Reference Range: 80 - 120 mg / dl Electronic Signature(s) Signed: 06/14/2021 5:38:48 PM By: Lorrin Jackson Entered By: Lorrin Jackson on 06/14/2021 08:54:22

## 2021-06-21 ENCOUNTER — Other Ambulatory Visit: Payer: Self-pay

## 2021-06-21 ENCOUNTER — Encounter (HOSPITAL_BASED_OUTPATIENT_CLINIC_OR_DEPARTMENT_OTHER): Payer: Medicare PPO | Admitting: Internal Medicine

## 2021-06-21 DIAGNOSIS — L97822 Non-pressure chronic ulcer of other part of left lower leg with fat layer exposed: Secondary | ICD-10-CM

## 2021-06-21 DIAGNOSIS — H20032 Secondary infectious iridocyclitis, left eye: Secondary | ICD-10-CM | POA: Diagnosis not present

## 2021-06-21 DIAGNOSIS — E11622 Type 2 diabetes mellitus with other skin ulcer: Secondary | ICD-10-CM | POA: Diagnosis not present

## 2021-06-21 DIAGNOSIS — Z7984 Long term (current) use of oral hypoglycemic drugs: Secondary | ICD-10-CM | POA: Diagnosis not present

## 2021-06-21 DIAGNOSIS — I10 Essential (primary) hypertension: Secondary | ICD-10-CM

## 2021-06-21 DIAGNOSIS — G4733 Obstructive sleep apnea (adult) (pediatric): Secondary | ICD-10-CM | POA: Diagnosis not present

## 2021-06-21 NOTE — Progress Notes (Signed)
LEWI, DROST ([id number]) Visit Report for 06/21/2021 Chief Complaint Document Details Patient Name: Date of Service: [name] 06/21/2021 8:15 A M Medical Record Number: [id number] Patient Account Number: [id number] Date of Birth/Sex: Treating RN: 1945/10/12 (76 y.o. [name] Primary Care Provider: Elyn Peers Other Clinician: Referring Provider: Treating Provider/Extender: Arnette Schaumann Weeks in Treatment: 10 Information Obtained from: Patient Chief Complaint Left lower extremity wound Electronic Signature(s) Signed: 06/21/2021 9:22:49 AM By: Kalman Shan DO Entered By: Kalman Shan on 06/21/2021 09:17:18 -------------------------------------------------------------------------------- HPI Details Patient Name: Date of Service: [name], [name] 06/21/2021 8:15 A M Medical Record Number: [id number] Patient Account Number: [id number] Date of Birth/Sex: Treating RN: 1945-07-21 (41 y.o. [name] Primary Care Provider: Elyn Peers Other Clinician: Referring Provider: Treating Provider/Extender: Arnette Schaumann Weeks in Treatment: 10 History of Present Illness HPI Description: Admission 04/06/2021 Mr. Serjio Deupree is a 76 year old male with a past medical history of type 2 diabetes on oral medications, essential hypertension and OSA who presents to the clinic for a 56-month history of nonhealing wound to his left lower extremity. He states that he developed a wound from a pressure washer in the beginning of September. He states that the wound had improved to the point that it almost closed and then became significantly worse in the past couple weeks. He visited the ED on 04/04/2021 for this issue and was started on doxycycline. He currently denies signs of infection. 11/3; patient presents for follow-up. He has been using Hydrofera Blue without issues. He has no issues or complaints today. He denies signs of  infection. He does not have pain to the wound site. 11/10; patient presents for follow-up. He has no issues or complaints today. He tolerated the compression wrap well. He denies signs of infection. 11/17; patient presents for 1 week follow-up. He has tolerated the compression wrap well. He has no issues or complaints today and denies signs of infection. 12/1; patient presents for follow-up. He has no issues or complaints today. He denies signs of infection. 12/8; patient presents for follow-up. He has no issues or complaints today. He denies signs of infection. 12/15; patient presents for follow-up. He has no issues or complaints today. 12/22; patient presents for follow-up. He is tolerated the compression wrap well. He denies signs of infection. 12/30; patient presents for follow-up. He has no issues or complaints today. He denies signs of infection. 1/12; patient presents for follow-up. He has tolerated the compression wrap well. He is pleased with his wound healing thus far. He denies signs of infection. Electronic Signature(s) Signed: 06/21/2021 9:22:49 AM By: Kalman Shan DO Entered By: Kalman Shan on 06/21/2021 09:17:59 -------------------------------------------------------------------------------- Chemical Cauterization Details Patient Name: Date of Service: [name] 06/21/2021 8:15 A M Medical Record Number: [id number] Patient Account Number: [id number] Date of Birth/Sex: Treating RN: [birth year] (89 y.o. [name] Primary Care Provider: Elyn Peers Other Clinician: Referring Provider: Treating Provider/Extender: Arnette Schaumann Weeks in Treatment: 10 Procedure Performed for: Wound #1 Left,Anterior Lower Leg Performed By: Physician Kalman Shan, DO Post Procedure Diagnosis Same as Pre-procedure Notes silver nitrate Electronic Signature(s) Signed: 06/21/2021 9:22:49 AM By: Kalman Shan DO Signed: 06/21/2021 6:02:53 PM By: Levan Hurst RN, BSN Entered By: Levan Hurst on 06/21/2021 09:10:31 -------------------------------------------------------------------------------- Physical Exam Details Patient Name: Date of Service: [name] 06/21/2021 8:15 A M Medical Record Number: [id number] Patient Account Number: [id number] Date  of Birth/Sex: Treating RN: November 10, 1945 (78 y.o. [name] Primary Care Provider: Elyn Peers Other Clinician: Referring Provider: Treating Provider/Extender: Arnette Schaumann Weeks in Treatment: 10 Constitutional respirations regular, non-labored and within target range for patient.. Cardiovascular 2+ dorsalis pedis/posterior tibialis pulses. Psychiatric pleasant and cooperative. Notes Left lower extremity: Open wound to the anterior aspect with hyper granulated tissue and granulation tissue throughout. No more undermining noted. No signs of infection. Good edema control. Electronic Signature(s) Signed: 06/21/2021 9:22:49 AM By: Kalman Shan DO Entered By: Kalman Shan on 06/21/2021 09:19:05 -------------------------------------------------------------------------------- Physician Orders Details Patient Name: Date of Service: [name], [name] 06/21/2021 8:15 A M Medical Record Number: [id number] Patient Account Number: [id number] Date of Birth/Sex: Treating RN: Jul 10, 1945 (70 y.o. [name] Primary Care Provider: Elyn Peers Other Clinician: Referring Provider: Treating Provider/Extender: Arnette Schaumann Weeks in Treatment: 10 Verbal / Phone Orders: No Diagnosis Coding ICD-10 Coding Code Description [phone] Non-pressure chronic ulcer of other part of left lower leg with fat layer exposed E11.622 Type 2 diabetes mellitus with other skin ulcer I10 Essential (primary) hypertension H20.032 Secondary infectious iridocyclitis, left eye Follow-up Appointments ppointment in 2 weeks. - Dr. Heber [place] Return A Nurse  Visit: - 1 week for rewrap Bathing/ Shower/ Hygiene May shower with protection but do not get wound dressing(s) wet. Edema Control - Lymphedema / SCD / Other Elevate legs to the level of the heart or above for 30 minutes daily and/or when sitting, a frequency of: - throughout the day. Avoid standing for long periods of time. Exercise regularly Wound Treatment Wound #1 - Lower Leg Wound Laterality: Left, Anterior Cleanser: Soap and Water 1 x Per Week Discharge Instructions: May shower and wash wound with dial antibacterial soap and water prior to dressing change. Cleanser: Wound Cleanser 1 x Per Week Discharge Instructions: Cleanse the wound with wound cleanser prior to applying a clean dressing using gauze sponges, not tissue or cotton balls. Peri-Wound Care: Sween Lotion (Moisturizing lotion) 1 x Per Week Discharge Instructions: Apply moisturizing lotion as directed Topical: Gentamicin 1 x Per Week Discharge Instructions: apply under the hydrofera blue. Prim Dressing: Hydrofera Blue Classic Foam, 2x2 in 1 x Per Week ary Discharge Instructions: Moisten with saline prior to applying to wound bed Secondary Dressing: Woven Gauze Sponge, Non-Sterile 4x4 in 1 x Per Week Discharge Instructions: Apply over primary dressing as directed. Secondary Dressing: ABD Pad, 8x10 1 x Per Week Discharge Instructions: Apply over primary dressing as directed. Compression Wrap: Kerlix Roll 4.5x3.1 (in/yd) 1 x Per Week Discharge Instructions: Apply Kerlix and Coban compression as directed. Compression Wrap: Coban Self-Adherent Wrap 4x5 (in/yd) 1 x Per Week Discharge Instructions: Apply over Kerlix as directed. Electronic Signature(s) Signed: 06/21/2021 9:22:49 AM By: Kalman Shan DO Entered By: Kalman Shan on 06/21/2021 09:21:56 -------------------------------------------------------------------------------- Problem List Details Patient Name: Date of Service: [name] 06/21/2021 8:15 A  M Medical Record Number: [id number] Patient Account Number: [id number] Date of Birth/Sex: Treating RN: [birth year] (82 y.o. [name] Primary Care Provider: Elyn Peers Other Clinician: Referring Provider: Treating Provider/Extender: Arnette Schaumann Weeks in Treatment: 10 Active Problems ICD-10 Encounter Code Description Active Date MDM Diagnosis [phone] Non-pressure chronic ulcer of other part of left lower leg with fat layer 04/06/2021 No Yes exposed E11.622 Type 2 diabetes mellitus with other skin ulcer 04/19/2021 No Yes I10 Essential (primary) hypertension 04/06/2021 No Yes H20.032 Secondary infectious iridocyclitis, left eye 04/12/2021 No Yes Inactive  Problems Resolved Problems Electronic Signature(s) Signed: 06/21/2021 9:22:49 AM By: Kalman Shan DO Entered By: Kalman Shan on 06/21/2021 09:16:43 -------------------------------------------------------------------------------- Progress Note Details Patient Name: Date of Service: [name] 06/21/2021 8:15 A M Medical Record Number: [id number] Patient Account Number: [id number] Date of Birth/Sex: Treating RN: 11/01/1945 (53 y.o. [name] Primary Care Provider: Elyn Peers Other Clinician: Referring Provider: Treating Provider/Extender: Arnette Schaumann Weeks in Treatment: 10 Subjective Chief Complaint Information obtained from Patient Left lower extremity wound History of Present Illness (HPI) Admission 04/06/2021 Mr. Cheick Suhr is a 76 year old male with a past medical history of type 2 diabetes on oral medications, essential hypertension and OSA who presents to the clinic for a 63-month history of nonhealing wound to his left lower extremity. He states that he developed a wound from a pressure washer in the beginning of September. He states that the wound had improved to the point that it almost closed and then became significantly worse in the past  couple weeks. He visited the ED on 04/04/2021 for this issue and was started on doxycycline. He currently denies signs of infection. 11/3; patient presents for follow-up. He has been using Hydrofera Blue without issues. He has no issues or complaints today. He denies signs of infection. He does not have pain to the wound site. 11/10; patient presents for follow-up. He has no issues or complaints today. He tolerated the compression wrap well. He denies signs of infection. 11/17; patient presents for 1 week follow-up. He has tolerated the compression wrap well. He has no issues or complaints today and denies signs of infection. 12/1; patient presents for follow-up. He has no issues or complaints today. He denies signs of infection. 12/8; patient presents for follow-up. He has no issues or complaints today. He denies signs of infection. 12/15; patient presents for follow-up. He has no issues or complaints today. 12/22; patient presents for follow-up. He is tolerated the compression wrap well. He denies signs of infection. 12/30; patient presents for follow-up. He has no issues or complaints today. He denies signs of infection. 1/12; patient presents for follow-up. He has tolerated the compression wrap well. He is pleased with his wound healing thus far. He denies signs of infection. Patient History Information obtained from Patient, Chart. Family History Unknown History. Social History Never smoker, Marital Status - Married, Alcohol Use - Rarely, Drug Use - No History, Caffeine Use - Rarely. Medical History Cardiovascular Patient has history of Hypertension Denies history of Angina, Arrhythmia, Congestive Heart Failure, Coronary Artery Disease, Deep Vein Thrombosis, Hypotension, Myocardial Infarction, Peripheral Arterial Disease, Peripheral Venous Disease, Phlebitis, Vasculitis Integumentary (Skin) Denies history of History of Burn Hospitalization/Surgery History - back surgery/foot surgery/  radiation plant sreed. Medical A Surgical History Notes nd Cardiovascular hyperlipdemia Endocrine pre0diabetic Oncologic hx of prostate ca Objective Constitutional respirations regular, non-labored and within target range for patient.. Vitals Time Taken: 8:30 AM, Height: 72 in, Weight: 195 lbs, BMI: 26.4, Temperature: 97.9 F, Pulse: 65 bpm, Respiratory Rate: 16 breaths/min, Blood Pressure: 109/68 mmHg. Cardiovascular 2+ dorsalis pedis/posterior tibialis pulses. Psychiatric pleasant and cooperative. General Notes: Left lower extremity: Open wound to the anterior aspect with hyper granulated tissue and granulation tissue throughout. No more undermining noted. No signs of infection. Good edema control. Integumentary (Hair, Skin) Wound #1 status is Open. Original cause of wound was Trauma. The date acquired was: 02/08/2021. The wound has been in treatment 10 weeks. The wound is located on the Left,Anterior Lower Leg. The wound  measures 1.5cm length x 2.3cm width x 0.2cm depth; 2.71cm^2 area and 0.542cm^3 volume. There is Fat Layer (Subcutaneous Tissue) exposed. There is a medium amount of serosanguineous drainage noted. The wound margin is distinct with the outline attached to the wound base. There is large (67-100%) red, pink, hyper - granulation within the wound bed. There is no necrotic tissue within the wound bed. Assessment Active Problems ICD-10 Non-pressure chronic ulcer of other part of left lower leg with fat layer exposed Type 2 diabetes mellitus with other skin ulcer Essential (primary) hypertension Secondary infectious iridocyclitis, left eye Patient's wound has shown improvement in size and appearance since last clinic visit. I used silver nitrate to the hyper granulated areas. There is no more undermining or tunneling noted. No signs of infection on exam. I recommended continuing with Hydrofera Blue and gentamicin under Kerlix/Coban. Procedures Wound #1 Pre-procedure  diagnosis of Wound #1 is a Trauma, Other located on the Left,Anterior Lower Leg . An Chemical Cauterization procedure was performed by Kalman Shan, DO. Post procedure Diagnosis Wound #1: Same as Pre-Procedure Notes: silver nitrate Plan Follow-up Appointments: Return Appointment in 2 weeks. - Dr. Heber  Nurse Visit: - 1 week for rewrap Bathing/ Shower/ Hygiene: May shower with protection but do not get wound dressing(s) wet. Edema Control - Lymphedema / SCD / Other: Elevate legs to the level of the heart or above for 30 minutes daily and/or when sitting, a frequency of: - throughout the day. Avoid standing for long periods of time. Exercise regularly WOUND #1: - Lower Leg Wound Laterality: Left, Anterior Cleanser: Soap and Water 1 x Per Week/ Discharge Instructions: May shower and wash wound with dial antibacterial soap and water prior to dressing change. Cleanser: Wound Cleanser 1 x Per Week/ Discharge Instructions: Cleanse the wound with wound cleanser prior to applying a clean dressing using gauze sponges, not tissue or cotton balls. Peri-Wound Care: Sween Lotion (Moisturizing lotion) 1 x Per Week/ Discharge Instructions: Apply moisturizing lotion as directed Topical: Gentamicin 1 x Per Week/ Discharge Instructions: apply under the hydrofera blue. Prim Dressing: Hydrofera Blue Classic Foam, 2x2 in 1 x Per Week/ ary Discharge Instructions: Moisten with saline prior to applying to wound bed Secondary Dressing: Woven Gauze Sponge, Non-Sterile 4x4 in 1 x Per Week/ Discharge Instructions: Apply over primary dressing as directed. Secondary Dressing: ABD Pad, 8x10 1 x Per Week/ Discharge Instructions: Apply over primary dressing as directed. Com pression Wrap: Kerlix Roll 4.5x3.1 (in/yd) 1 x Per Week/ Discharge Instructions: Apply Kerlix and Coban compression as directed. Com pression Wrap: Coban Self-Adherent Wrap 4x5 (in/yd) 1 x Per Week/ Discharge Instructions: Apply over Kerlix  as directed. 1. Silver nitrate 2. Gentamicin, Hydrofera Blue under Kerlix/Coban 3. Follow-up for nurse visit next week and in 2 weeks with me Electronic Signature(s) Signed: 06/21/2021 9:22:49 AM By: Kalman Shan DO Entered By: Kalman Shan on 06/21/2021 09:22:10 -------------------------------------------------------------------------------- HxROS Details Patient Name: Date of Service: [name], [name] 06/21/2021 8:15 A M Medical Record Number: [id number] Patient Account Number: [id number] Date of Birth/Sex: Treating RN: 02-11-1946 (60 y.o. [name] Primary Care Provider: Elyn Peers Other Clinician: Referring Provider: Treating Provider/Extender: Arnette Schaumann Weeks in Treatment: 10 Information Obtained From Patient Chart Cardiovascular Medical History: Positive for: Hypertension Negative for: Angina; Arrhythmia; Congestive Heart Failure; Coronary Artery Disease; Deep Vein Thrombosis; Hypotension; Myocardial Infarction; Peripheral Arterial Disease; Peripheral Venous Disease; Phlebitis; Vasculitis Past Medical History Notes: hyperlipdemia Endocrine Medical History: Past Medical History Notes: pre0diabetic Integumentary (Skin) Medical  History: Negative for: History of Burn Oncologic Medical History: Past Medical History Notes: hx of prostate ca Immunizations Pneumococcal Vaccine: Received Pneumococcal Vaccination: No Implantable Devices None Hospitalization / Surgery History Type of Hospitalization/Surgery back surgery/foot surgery/ radiation plant sreed Family and Social History Unknown History: Yes; Never smoker; Marital Status - Married; Alcohol Use: Rarely; Drug Use: No History; Caffeine Use: Rarely; Financial Concerns: No; Food, Clothing or Shelter Needs: No; Support System Lacking: No; Transportation Concerns: No Electronic Signature(s) Signed: 06/21/2021 9:22:49 AM By: Kalman Shan DO Signed: 06/21/2021 6:02:53 PM By:  Levan Hurst RN, BSN Entered By: Kalman Shan on 06/21/2021 09:18:12 -------------------------------------------------------------------------------- SuperBill Details Patient Name: Date of Service: [name] 06/21/2021 Medical Record Number: [id number] Patient Account Number: [id number] Date of Birth/Sex: Treating RN: [birth year] (69 y.o. 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, left eye Facility Procedures CPT4 Code: [id number] Description: 99213 - WOUND CARE VISIT-LEV 3 EST PT Modifier: Quantity: 1 Physician Procedures : CPT4 Code Description Modifier [id number] 99213 - WC PHYS LEVEL 3 - EST PT ICD-10 Diagnosis Description L97.822 Non-pressure chronic ulcer of other part of left lower leg with fat layer exposed E11.622 Type 2 diabetes mellitus with other skin ulcer I10  Essential (primary) hypertension 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

## 2021-06-28 ENCOUNTER — Encounter (HOSPITAL_BASED_OUTPATIENT_CLINIC_OR_DEPARTMENT_OTHER): Payer: Medicare PPO | Admitting: Internal Medicine

## 2021-06-28 ENCOUNTER — Other Ambulatory Visit: Payer: Self-pay

## 2021-06-28 DIAGNOSIS — G4733 Obstructive sleep apnea (adult) (pediatric): Secondary | ICD-10-CM | POA: Diagnosis not present

## 2021-06-28 DIAGNOSIS — H20032 Secondary infectious iridocyclitis, left eye: Secondary | ICD-10-CM | POA: Diagnosis not present

## 2021-06-28 DIAGNOSIS — L97822 Non-pressure chronic ulcer of other part of left lower leg with fat layer exposed: Secondary | ICD-10-CM | POA: Diagnosis not present

## 2021-06-28 DIAGNOSIS — E11622 Type 2 diabetes mellitus with other skin ulcer: Secondary | ICD-10-CM | POA: Diagnosis not present

## 2021-06-28 DIAGNOSIS — I10 Essential (primary) hypertension: Secondary | ICD-10-CM | POA: Diagnosis not present

## 2021-06-28 DIAGNOSIS — Z7984 Long term (current) use of oral hypoglycemic drugs: Secondary | ICD-10-CM | POA: Diagnosis not present

## 2021-06-28 NOTE — Progress Notes (Signed)
Larry Welch, Larry Welch ([id number]) Visit Report for 06/28/2021 SuperBill Details Patient Name: Date of Service: Larry Welch RLES E. 06/28/2021 Medical Record Number: [id number] Patient Account Number: [id number] Date of Birth/Sex: Treating RN: [birth year] (76 y.o. [name] Primary Care Provider: Elyn Peers Other Clinician: Referring Provider: Treating Provider/Extender: Arnette Schaumann Weeks in Treatment: 11 Diagnosis Coding ICD-10 Codes Code Description [phone] Non-pressure chronic ulcer of other part of left lower leg with fat layer exposed E11.622 Type 2 diabetes mellitus with other skin ulcer I10 Essential (primary) hypertension H20.032 Secondary infectious iridocyclitis, left eye Facility Procedures CPT4 Code Description Modifier Quantity [id number] 99213 - WOUND CARE VISIT-LEV 3 EST PT 1 Electronic Signature(s) Signed: 06/28/2021 9:13:03 AM By: Lorrin Jackson Signed: 06/28/2021 9:21:14 AM By: Kalman Shan DO Entered By: Lorrin Jackson on 06/28/2021 09:13:02

## 2021-07-02 NOTE — Progress Notes (Signed)
CANNON, ARREOLA ([id number]) Visit Report for 06/28/2021 Arrival Information Details Patient Name: Date of Service: [name] 06/28/2021 8:45 A M Medical Record Number: [id number] Patient Account Number: [id number] Date of Birth/Sex: Treating RN: [birth year] (76 y.o. [name] Primary Care Faron Whitelock: Elyn Peers Other Clinician: Referring Hayat Warbington: Treating Jhade Berko/Extender: Jeannine Boga in Treatment: 11 Visit Information History Since Last Visit Added or deleted any medications: No Patient Arrived: Ambulatory Any new allergies or adverse reactions: No Arrival Time: 08:50 Had a fall or experienced change in No Accompanied By: self activities of daily living that may affect Transfer Assistance: None risk of falls: Patient Identification Verified: Yes Signs or symptoms of abuse/neglect since last visito No Secondary Verification Process Completed: Yes Hospitalized since last visit: No Patient Requires Transmission-Based Precautions: No Implantable device outside of the clinic excluding No Patient Has Alerts: Yes cellular tissue based products placed in the center Patient Alerts: R ABI: 1.2 TBI: 0.88 since last visit: L ABI: 1.22 TBI: 1.07 Has Dressing in Place as Prescribed: Yes Pain Present Now: No Electronic Signature(s) Signed: 06/28/2021 11:34:21 AM By: Sandre Kitty Entered By: Sandre Kitty on 06/28/2021 08:50:39 -------------------------------------------------------------------------------- Clinic Level of Care Assessment Details Patient Name: Date of Service: [name] 06/28/2021 8:45 A M Medical Record Number: [id number] Patient Account Number: [id number] Date of Birth/Sex: Treating RN: 12/11/1945 (76 y.o. [name] Primary Care Breslyn Abdo: Elyn Peers Other Clinician: Referring Raffael Bugarin: Treating Margrett Kalb/Extender: Arnette Schaumann Weeks in Treatment: 11 Clinic Level of Care Assessment  Items TOOL 4 Quantity Score X- 1 0 Use when only an EandM is performed on FOLLOW-UP visit ASSESSMENTS - Nursing Assessment / Reassessment X- 1 10 Reassessment of Co-morbidities (includes updates in patient status) X- 1 5 Reassessment of Adherence to Treatment Plan ASSESSMENTS - Wound and Skin A ssessment / Reassessment X - Simple Wound Assessment / Reassessment - one wound 1 5 []  - 0 Complex Wound Assessment / Reassessment - multiple wounds []  - 0 Dermatologic / Skin Assessment (not related to wound area) ASSESSMENTS - Focused Assessment []  - 0 Circumferential Edema Measurements - multi extremities []  - 0 Nutritional Assessment / Counseling / Intervention []  - 0 Lower Extremity Assessment (monofilament, tuning fork, pulses) []  - 0 Peripheral Arterial Disease Assessment (using hand held doppler) ASSESSMENTS - Ostomy and/or Continence Assessment and Care []  - 0 Incontinence Assessment and Management []  - 0 Ostomy Care Assessment and Management (repouching, etc.) PROCESS - Coordination of Care []  - 0 Simple Patient / Family Education for ongoing care X- 1 20 Complex (extensive) Patient / Family Education for ongoing care []  - 0 Staff obtains Programmer, systems, Records, T Results / Process Orders est []  - 0 Staff telephones HHA, Nursing Homes / Clarify orders / etc []  - 0 Routine Transfer to another Facility (non-emergent condition) []  - 0 Routine Hospital Admission (non-emergent condition) []  - 0 New Admissions / Biomedical engineer / Ordering NPWT Apligraf, etc. , []  - 0 Emergency Hospital Admission (emergent condition) []  - 0 Simple Discharge Coordination []  - 0 Complex (extensive) Discharge Coordination PROCESS - Special Needs []  - 0 Pediatric / Minor Patient Management []  - 0 Isolation Patient Management []  - 0 Hearing / Language / Visual special needs []  - 0 Assessment of Community assistance (transportation, D/C planning, etc.) []  - 0 Additional assistance  / Altered mentation []  - 0 Support Surface(s) Assessment (bed, cushion, seat, etc.) INTERVENTIONS - Wound Cleansing / Measurement X -  Simple Wound Cleansing - one wound 1 5 []  - 0 Complex Wound Cleansing - multiple wounds X- 1 5 Wound Imaging (photographs - any number of wounds) []  - 0 Wound Tracing (instead of photographs) X- 1 5 Simple Wound Measurement - one wound []  - 0 Complex Wound Measurement - multiple wounds INTERVENTIONS - Wound Dressings []  - 0 Small Wound Dressing one or multiple wounds []  - 0 Medium Wound Dressing one or multiple wounds X- 1 20 Large Wound Dressing one or multiple wounds []  - 0 Application of Medications - topical []  - 0 Application of Medications - injection INTERVENTIONS - Miscellaneous []  - 0 External ear exam []  - 0 Specimen Collection (cultures, biopsies, blood, body fluids, etc.) []  - 0 Specimen(s) / Culture(s) sent or taken to Lab for analysis []  - 0 Patient Transfer (multiple staff / Civil Service fast streamer / Similar devices) []  - 0 Simple Staple / Suture removal (25 or less) []  - 0 Complex Staple / Suture removal (26 or more) []  - 0 Hypo / Hyperglycemic Management (close monitor of Blood Glucose) []  - 0 Ankle / Brachial Index (ABI) - do not check if billed separately X- 1 5 Vital Signs Has the patient been seen at the hospital within the last three years: Yes Total Score: 80 Level Of Care: New/Established - Level 3 Electronic Signature(s) Signed: 06/28/2021 5:38:49 PM By: Lorrin Jackson Entered By: Lorrin Jackson on 06/28/2021 09:12:56 -------------------------------------------------------------------------------- Encounter Discharge Information Details Patient Name: Date of Service: [name], [name] 06/28/2021 8:45 A M Medical Record Number: [id number] Patient Account Number: [id number] Date of Birth/Sex: Treating RN: 02-25-1946 (76 y.o. [name] Primary Care Alacia Rehmann: Elyn Peers Other Clinician: Referring  Mitsue Peery: Treating Mckaylin Bastien/Extender: Arnette Schaumann Weeks in Treatment: 11 Encounter Discharge Information Items Discharge Condition: Stable Ambulatory Status: Ambulatory Discharge Destination: Home Transportation: Private Auto Schedule Follow-up Appointment: Yes Clinical Summary of Care: Provided on 06/28/2021 Form Type Recipient Paper Patient Patient Electronic Signature(s) Signed: 06/28/2021 9:12:27 AM By: Lorrin Jackson Entered By: Lorrin Jackson on 06/28/2021 09:12:27 -------------------------------------------------------------------------------- Patient/Caregiver Education Details Patient Name: Date of Service: Orvan July 1/19/2023andnbsp8:45 A M Medical Record Number: [id number] Patient Account Number: [id number] Date of Birth/Gender: Treating RN: 1946/06/03 (76 y.o. [name] Primary Care Physician: Elyn Peers Other Clinician: Referring Physician: Treating Physician/Extender: Jeannine Boga in Treatment: 11 Education Assessment Education Provided To: Patient Education Topics Provided Venous: Methods: Explain/Verbal Responses: State content correctly Wound/Skin Impairment: Methods: Demonstration, Explain/Verbal Responses: State content correctly Electronic Signature(s) Signed: 06/28/2021 5:38:49 PM By: Lorrin Jackson Entered By: Lorrin Jackson on 06/28/2021 09:12:10 -------------------------------------------------------------------------------- Wound Assessment Details Patient Name: Date of Service: [name] 06/28/2021 8:45 A M Medical Record Number: [id number] Patient Account Number: [id number] Date of Birth/Sex: Treating RN: April 29, 1946 (76 y.o. [name] Primary Care Maynard David: Elyn Peers Other Clinician: Referring Phynix Horton: Treating Tonilynn Bieker/Extender: Arnette Schaumann Weeks in Treatment: 11 Wound Status Wound Number: 1 Primary Etiology: Trauma, Other Wound  Location: Left, Anterior Lower Leg Wound Status: Open Wounding Event: Trauma Comorbid History: Hypertension Date Acquired: 02/08/2021 Weeks Of Treatment: 11 Clustered Wound: Yes Wound Measurements Length: (cm) 1.5 Width: (cm) 2.3 Depth: (cm) 0.2 Area: (cm) 2.71 Volume: (cm) 0.542 % Reduction in Area: 73.9% % Reduction in Volume: 95.6% Epithelialization: Medium (34-66%) Tunneling: No Undermining: No Wound Description Classification: Full Thickness Without Exposed Support Structures Wound Margin: Distinct, outline attached Exudate Amount: Medium Exudate Type: Serosanguineous Exudate Color:  red, brown Foul Odor After Cleansing: No Slough/Fibrino No Wound Bed Granulation Amount: Large (67-100%) Exposed Structure Granulation Quality: Red, Pink Fascia Exposed: No Necrotic Amount: None Present (0%) Fat Layer (Subcutaneous Tissue) Exposed: Yes Tendon Exposed: No Muscle Exposed: No Joint Exposed: No Bone Exposed: No Treatment Notes Wound #1 (Lower Leg) Wound Laterality: Left, Anterior Cleanser Soap and Water Discharge Instruction: May shower and wash wound with dial antibacterial soap and water prior to dressing change. Wound Cleanser Discharge Instruction: Cleanse the wound with wound cleanser prior to applying a clean dressing using gauze sponges, not tissue or cotton balls. Peri-Wound Care Sween Lotion (Moisturizing lotion) Discharge Instruction: Apply moisturizing lotion as directed Topical Gentamicin Discharge Instruction: apply under the hydrofera blue. Primary Dressing Hydrofera Blue Classic Foam, 2x2 in Discharge Instruction: Moisten with saline prior to applying to wound bed Secondary Dressing Woven Gauze Sponge, Non-Sterile 4x4 in Discharge Instruction: Apply over primary dressing as directed. ABD Pad, 8x10 Discharge Instruction: Apply over primary dressing as directed. Secured With Compression Wrap Kerlix Roll 4.5x3.1 (in/yd) Discharge Instruction: Apply  Kerlix and Coban compression as directed. Coban Self-Adherent Wrap 4x5 (in/yd) Discharge Instruction: Apply over Kerlix as directed. Compression Stockings Add-Ons Electronic Signature(s) Signed: 06/28/2021 9:11:36 AM By: Lorrin Jackson Signed: 07/02/2021 5:11:23 PM By: Levan Hurst RN, BSN Entered By: Lorrin Jackson on 06/28/2021 09:11:35 -------------------------------------------------------------------------------- Vitals Details Patient Name: Date of Service: [name], [name] 06/28/2021 8:45 A M Medical Record Number: [id number] Patient Account Number: [id number] Date of Birth/Sex: Treating RN: [birth year] (25 y.o. [name] Primary Care Hai Grabe: Elyn Peers Other Clinician: Referring Adar Rase: Treating Leiam Hopwood/Extender: Arnette Schaumann Weeks in Treatment: 11 Vital Signs Time Taken: 08:52 Temperature (F): 98.0 Height (in): 72 Pulse (bpm): 69 Weight (lbs): 195 Respiratory Rate (breaths/min): 16 Body Mass Index (BMI): 26.4 Blood Pressure (mmHg): 133/77 Reference Range: 80 - 120 mg / dl Electronic Signature(s) Signed: 06/28/2021 11:34:21 AM By: Sandre Kitty Entered By: Sandre Kitty on 06/28/2021 08:52:10

## 2021-07-05 ENCOUNTER — Other Ambulatory Visit: Payer: Self-pay

## 2021-07-05 ENCOUNTER — Encounter (HOSPITAL_BASED_OUTPATIENT_CLINIC_OR_DEPARTMENT_OTHER): Payer: Medicare PPO | Admitting: Internal Medicine

## 2021-07-05 DIAGNOSIS — L97822 Non-pressure chronic ulcer of other part of left lower leg with fat layer exposed: Secondary | ICD-10-CM

## 2021-07-05 DIAGNOSIS — I10 Essential (primary) hypertension: Secondary | ICD-10-CM | POA: Diagnosis not present

## 2021-07-05 DIAGNOSIS — Z7984 Long term (current) use of oral hypoglycemic drugs: Secondary | ICD-10-CM | POA: Diagnosis not present

## 2021-07-05 DIAGNOSIS — G4733 Obstructive sleep apnea (adult) (pediatric): Secondary | ICD-10-CM | POA: Diagnosis not present

## 2021-07-05 DIAGNOSIS — H20032 Secondary infectious iridocyclitis, left eye: Secondary | ICD-10-CM | POA: Diagnosis not present

## 2021-07-05 DIAGNOSIS — E11622 Type 2 diabetes mellitus with other skin ulcer: Secondary | ICD-10-CM | POA: Diagnosis not present

## 2021-07-05 NOTE — Progress Notes (Signed)
JASKARAN, DAUZAT ([id number]) Visit Report for 07/05/2021 Chief Complaint Document Details Patient Name: Date of Service: Larry Sickles RLES E. 07/05/2021 8:15 A M Medical Record Number: [id number] Patient Account Number: [id number] Date of Birth/Sex: Treating RN: [birth year] (76 y.o. [name] Primary Care Provider: Elyn Peers Other Clinician: Referring Provider: Treating Provider/Extender: Arnette Schaumann Weeks in Treatment: 12 Information Obtained from: Patient Chief Complaint Left lower extremity wound Electronic Signature(s) Signed: 07/05/2021 9:14:58 AM By: Kalman Shan DO Entered By: Kalman Shan on 07/05/2021 09:08:06 -------------------------------------------------------------------------------- Debridement Details Patient Name: Date of Service: Larry Sickles RLES E. 07/05/2021 8:15 A M Medical Record Number: [id number] Patient Account Number: [id number] Date of Birth/Sex: Treating RN: 08-27-1945 (86 y.o. Collene Gobble Primary Care Provider: Elyn Peers Other Clinician: Referring Provider: Treating Provider/Extender: Arnette Schaumann Weeks in Treatment: 12 Debridement Performed for Assessment: Wound #1 Left,Anterior Lower Leg Performed By: Physician Kalman Shan, DO Debridement Type: Debridement Level of Consciousness (Pre-procedure): Awake and Alert Pre-procedure Verification/Time Out Yes - 08:45 Taken: Start Time: 08:45 Pain Control: Other : Benzocaine 20% T Area Debrided (L x W): otal 0.4 (cm) x 0.6 (cm) = 0.24 (cm) Tissue and other material debrided: Skin: Dermis , Skin: Epidermis Level: Skin/Epidermis Debridement Description: Selective/Open Wound Instrument: Curette Bleeding: Minimum Hemostasis Achieved: Pressure End Time: 08:47 Procedural Pain: 0 Post Procedural Pain: 0 Response to Treatment: Procedure was tolerated well Level of Consciousness (Post- Awake and Alert procedure): Post Debridement  Measurements of Total Wound Length: (cm) 0.4 Width: (cm) 0.6 Depth: (cm) 0.1 Volume: (cm) 0.019 Character of Wound/Ulcer Post Debridement: Improved Post Procedure Diagnosis Same as Pre-procedure Electronic Signature(s) Signed: 07/05/2021 9:14:58 AM By: Kalman Shan DO Signed: 07/05/2021 5:49:07 PM By: Dellie Catholic RN Entered By: Dellie Catholic on 07/05/2021 08:48:48 -------------------------------------------------------------------------------- HPI Details Patient Name: Date of Service: Larry Welch, Larry RLES E. 07/05/2021 8:15 A M Medical Record Number: [id number] Patient Account Number: [id number] Date of Birth/Sex: Treating RN: 12/14/1945 (35 y.o. [name] Primary Care Provider: Elyn Peers Other Clinician: Referring Provider: Treating Provider/Extender: Arnette Schaumann Weeks in Treatment: 12 History of Present Illness HPI Description: Admission 04/06/2021 Larry Welch is a 76 year old male with a past medical history of type 2 diabetes on oral medications, essential hypertension and OSA who presents to the clinic for a 40-month history of nonhealing wound to his left lower extremity. He states that he developed a wound from a pressure washer in the beginning of September. He states that the wound had improved to the point that it almost closed and then became significantly worse in the past couple weeks. He visited the ED on 04/04/2021 for this issue and was started on doxycycline. He currently denies signs of infection. 11/3; patient presents for follow-up. He has been using Hydrofera Blue without issues. He has no issues or complaints today. He denies signs of infection. He does not have pain to the wound site. 11/10; patient presents for follow-up. He has no issues or complaints today. He tolerated the compression wrap well. He denies signs of infection. 11/17; patient presents for 1 week follow-up. He has tolerated the compression wrap well. He has  no issues or complaints today and denies signs of infection. 12/1; patient presents for follow-up. He has no issues or complaints today. He denies signs of infection. 12/8; patient presents for follow-up. He has no issues or complaints today. He denies signs of infection. 12/15; patient presents for follow-up. He has  no issues or complaints today. 12/22; patient presents for follow-up. He is tolerated the compression wrap well. He denies signs of infection. 12/30; patient presents for follow-up. He has no issues or complaints today. He denies signs of infection. 1/12; patient presents for follow-up. He has tolerated the compression wrap well. He is pleased with his wound healing thus far. He denies signs of infection. 1/26; patient presents for follow-up. He reports improvement in wound healing. He denies signs of infection. He has no issues or complaints today. Electronic Signature(s) Signed: 07/05/2021 9:14:58 AM By: Kalman Shan DO Entered By: Kalman Shan on 07/05/2021 09:08:39 -------------------------------------------------------------------------------- Physical Exam Details Patient Name: Date of Service: Larry Sickles RLES E. 07/05/2021 8:15 A M Medical Record Number: [id number] Patient Account Number: [id number] Date of Birth/Sex: Treating RN: May 25, 1946 (67 y.o. [name] Primary Care Provider: Elyn Peers Other Clinician: Referring Provider: Treating Provider/Extender: Arnette Schaumann Weeks in Treatment: 12 Constitutional respirations regular, non-labored and within target range for patient.. Cardiovascular 2+ dorsalis pedis/posterior tibialis pulses. Psychiatric pleasant and cooperative. Notes Left lower extremity: Open wound to the anterior aspect with hyper granulated tissue and devitalized tissue present. No undermining noted. No signs of infection. Good edema control. Wound appears well-healing. Electronic Signature(s) Signed: 07/05/2021  9:14:58 AM By: Kalman Shan DO Entered By: Kalman Shan on 07/05/2021 09:09:39 -------------------------------------------------------------------------------- Physician Orders Details Patient Name: Date of Service: Larry Sickles RLES E. 07/05/2021 8:15 A M Medical Record Number: [id number] Patient Account Number: [id number] Date of Birth/Sex: Treating RN: 1946-04-27 (66 y.o. Collene Gobble Primary Care Provider: Elyn Peers Other Clinician: Referring Provider: Treating Provider/Extender: Arnette Schaumann Weeks in Treatment: 46 Verbal / Phone Orders: No Diagnosis Coding Follow-up Appointments ppointment in 1 week. - Dr Heber [place] Return A Bathing/ Shower/ Hygiene May shower with protection but do not get wound dressing(s) wet. Edema Control - Lymphedema / SCD / Other Elevate legs to the level of the heart or above for 30 minutes daily and/or when sitting, a frequency of: - throughout the day. Avoid standing for long periods of time. Exercise regularly Wound Treatment Wound #1 - Lower Leg Wound Laterality: Left, Anterior Cleanser: Soap and Water 1 x Per Week Discharge Instructions: May shower and wash wound with dial antibacterial soap and water prior to dressing change. Cleanser: Wound Cleanser 1 x Per Week Discharge Instructions: Cleanse the wound with wound cleanser prior to applying a clean dressing using gauze sponges, not tissue or cotton balls. Peri-Wound Care: Sween Lotion (Moisturizing lotion) 1 x Per Week Discharge Instructions: Apply moisturizing lotion as directed Topical: Gentamicin 1 x Per Week Discharge Instructions: apply under the hydrofera blue. Prim Dressing: Hydrofera Blue Classic Foam, 2x2 in 1 x Per Week ary Discharge Instructions: Moisten with saline prior to applying to wound bed Secondary Dressing: Woven Gauze Sponge, Non-Sterile 4x4 in 1 x Per Week Discharge Instructions: Apply over primary dressing as directed. Compression Wrap:  Kerlix Roll 4.5x3.1 (in/yd) 1 x Per Week Discharge Instructions: Apply Kerlix and Coban compression as directed. Compression Wrap: Coban Self-Adherent Wrap 4x5 (in/yd) 1 x Per Week Discharge Instructions: Apply over Kerlix as directed. Electronic Signature(s) Signed: 07/05/2021 9:14:58 AM By: Kalman Shan DO Entered By: Kalman Shan on 07/05/2021 09:09:56 -------------------------------------------------------------------------------- Problem List Details Patient Name: Date of Service: Larry Sickles RLES E. 07/05/2021 8:15 A M Medical Record Number: [id number] Patient Account Number: [id number] Date of Birth/Sex: Treating RN: 1946/01/05 (51 y.o. 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 07/05/2021 8:15 A M Medical Record Number: [id number] Patient Account Number: [id number] Date of Birth/Sex: Treating RN: 05-07-1946 (84 y.o. [name] Primary Care Provider: Elyn Peers Other Clinician: Referring Provider: Treating Provider/Extender: Arnette Schaumann Weeks in Treatment: 12 Subjective Chief Complaint Information obtained from  Patient Left lower extremity wound History of Present Illness (HPI) Admission 04/06/2021 Mr. Larry Welch is a 76 year old male with a past medical history of type 2 diabetes on oral medications, essential hypertension and OSA who presents to the clinic for a 26-month history of nonhealing wound to his left lower extremity. He states that he developed a wound from a pressure washer in the beginning of September. He states that the wound had improved to the point that it almost closed and then became significantly worse in the past couple weeks. He visited the ED on 04/04/2021 for this issue and was started on doxycycline. He currently denies signs of infection. 11/3; patient presents for follow-up. He has been using Hydrofera Blue without issues. He has no issues or complaints today. He denies signs of infection. He does not have pain to the wound site. 11/10; patient presents for follow-up. He has no issues or complaints today. He tolerated the compression wrap well. He denies signs of infection. 11/17; patient presents for 1 week follow-up. He has tolerated the compression wrap well. He has no issues or complaints today and denies signs of infection. 12/1; patient presents for follow-up. He has no issues or complaints today. He denies signs of infection. 12/8; patient presents for follow-up. He has no issues or complaints today. He denies signs of infection. 12/15; patient presents for follow-up. He has no issues or complaints today. 12/22; patient presents for follow-up. He is tolerated the compression wrap well. He denies signs of infection. 12/30; patient presents for follow-up. He has no issues or complaints today. He denies signs of infection. 1/12; patient presents for follow-up. He has tolerated the compression wrap well. He is pleased with his wound healing thus far. He denies signs of infection. 1/26; patient presents for follow-up. He reports improvement in wound healing. He denies signs  of infection. He has no issues or complaints today. Patient History Information obtained from Patient, Chart. Family History Unknown History. Social History Never smoker, Marital Status - Married, Alcohol Use - Rarely, Drug Use - No History, Caffeine Use - Rarely. Medical History Cardiovascular Patient has history of Hypertension Denies history of Angina, Arrhythmia, Congestive Heart Failure, Coronary Artery Disease, Deep Vein Thrombosis, Hypotension, Myocardial Infarction, Peripheral Arterial Disease, Peripheral Venous Disease, Phlebitis, Vasculitis Integumentary (Skin) Denies history of History of Burn Hospitalization/Surgery History - back surgery/foot surgery/ radiation plant sreed. Medical A Surgical History Notes nd Cardiovascular hyperlipdemia Endocrine pre0diabetic Oncologic hx of prostate ca Objective Constitutional respirations regular, non-labored and within target range for patient.. Vitals Time Taken: 8:33 AM, Height: 72 in, Weight: 195  lbs, BMI: 26.4, Temperature: 98.6 F, Pulse: 56 bpm, Respiratory Rate: 18 breaths/min, Blood Pressure: 153/88 mmHg. Cardiovascular 2+ dorsalis pedis/posterior tibialis pulses. Psychiatric pleasant and cooperative. General Notes: Left lower extremity: Open wound to the anterior aspect with hyper granulated tissue and devitalized tissue present. No undermining noted. No signs of infection. Good edema control. Wound appears well-healing. Integumentary (Hair, Skin) Wound #1 status is Open. Original cause of wound was Trauma. The date acquired was: 02/08/2021. The wound has been in treatment 12 weeks. The wound is located on the Left,Anterior Lower Leg. The wound measures 0.4cm length x 0.6cm width x 0.1cm depth; 0.188cm^2 area and 0.019cm^3 volume. There is Fat Layer (Subcutaneous Tissue) exposed. There is no tunneling or undermining noted. There is a medium amount of serosanguineous drainage noted. The wound margin is distinct with the  outline attached to the wound base. There is large (67-100%) red, pink granulation within the wound bed. There is no necrotic tissue within the wound bed. Assessment Active Problems ICD-10 Non-pressure chronic ulcer of other part of left lower leg with fat layer exposed Type 2 diabetes mellitus with other skin ulcer Essential (primary) hypertension Secondary infectious iridocyclitis, left eye Patient's wound has shown improvement in size and appearance since last clinic visit. No signs of infection on exam. I debrided devitalized tissue. I used silver nitrate to the hyper granulated areas. I recommended continuing with Hydrofera Blue and gentamicin under compression therapy. I am hopeful that this will be closed in the next 1-2 visits. Procedures Wound #1 Pre-procedure diagnosis of Wound #1 is a Trauma, Other located on the Left,Anterior Lower Leg . There was a Selective/Open Wound Skin/Epidermis Debridement with a total area of 0.24 sq cm performed by Kalman Shan, DO. With the following instrument(s): Curette Material removed includes Skin: Dermis and Skin: Epidermis and after achieving pain control using Other (Benzocaine 20%). No specimens were taken. A time out was conducted at 08:45, prior to the start of the procedure. A Minimum amount of bleeding was controlled with Pressure. The procedure was tolerated well with a pain level of 0 throughout and a pain level of 0 following the procedure. Post Debridement Measurements: 0.4cm length x 0.6cm width x 0.1cm depth; 0.019cm^3 volume. Character of Wound/Ulcer Post Debridement is improved. Post procedure Diagnosis Wound #1: Same as Pre-Procedure Plan Follow-up Appointments: Return Appointment in 1 week. - Dr Heber [place] Bathing/ Shower/ Hygiene: May shower with protection but do not get wound dressing(s) wet. Edema Control - Lymphedema / SCD / Other: Elevate legs to the level of the heart or above for 30 minutes daily and/or when sitting, a  frequency of: - throughout the day. Avoid standing for long periods of time. Exercise regularly WOUND #1: - Lower Leg Wound Laterality: Left, Anterior Cleanser: Soap and Water 1 x Per Week/ Discharge Instructions: May shower and wash wound with dial antibacterial soap and water prior to dressing change. Cleanser: Wound Cleanser 1 x Per Week/ Discharge Instructions: Cleanse the wound with wound cleanser prior to applying a clean dressing using gauze sponges, not tissue or cotton balls. Peri-Wound Care: Sween Lotion (Moisturizing lotion) 1 x Per Week/ Discharge Instructions: Apply moisturizing lotion as directed Topical: Gentamicin 1 x Per Week/ Discharge Instructions: apply under the hydrofera blue. Prim Dressing: Hydrofera Blue Classic Foam, 2x2 in 1 x Per Week/ ary Discharge Instructions: Moisten with saline prior to applying to wound bed Secondary Dressing: Woven Gauze Sponge, Non-Sterile 4x4 in 1 x Per Week/ Discharge Instructions: Apply over primary dressing as directed. Com  pression Wrap: Kerlix Roll 4.5x3.1 (in/yd) 1 x Per Week/ Discharge Instructions: Apply Kerlix and Coban compression as directed. Com pression Wrap: Coban Self-Adherent Wrap 4x5 (in/yd) 1 x Per Week/ Discharge Instructions: Apply over Kerlix as directed. 1. In office sharp debridement 2. Hydrofera Blue and gentamicin under Kerlix/Coban 3. Follow-up in 1 week Electronic Signature(s) Signed: 07/05/2021 9:14:58 AM By: Kalman Shan DO Entered By: Kalman Shan on 07/05/2021 09:13:46 -------------------------------------------------------------------------------- HxROS Details Patient Name: Date of Service: Larry Welch, Larry RLES E. 07/05/2021 8:15 A M Medical Record Number: [id number] Patient Account Number: [id number] Date of Birth/Sex: Treating RN: Aug 28, 1945 (37 y.o. [name] Primary Care Provider: Elyn Peers Other Clinician: Referring Provider: Treating Provider/Extender: Arnette Schaumann Weeks in Treatment: 12 Information Obtained From Patient Chart Cardiovascular Medical History: Positive for: Hypertension Negative for: Angina; Arrhythmia; Congestive Heart Failure; Coronary Artery Disease; Deep Vein Thrombosis; Hypotension; Myocardial Infarction; Peripheral Arterial Disease; Peripheral Venous Disease; Phlebitis; Vasculitis Past Medical History Notes: hyperlipdemia Endocrine Medical History: Past Medical History Notes: pre0diabetic Integumentary (Skin) Medical History: Negative for: History of Burn Oncologic Medical History: Past Medical History Notes: hx of prostate ca Immunizations Pneumococcal Vaccine: Received Pneumococcal Vaccination: No Implantable Devices None Hospitalization / Surgery History Type of Hospitalization/Surgery back surgery/foot surgery/ radiation plant sreed Family and Social History Unknown History: Yes; Never smoker; Marital Status - Married; Alcohol Use: Rarely; Drug Use: No History; Caffeine Use: Rarely; Financial Concerns: No; Food, Clothing or Shelter Needs: No; Support System Lacking: No; Transportation Concerns: No Electronic Signature(s) Signed: 07/05/2021 9:14:58 AM By: Kalman Shan DO Signed: 07/05/2021 6:13:38 PM By: Levan Hurst RN, BSN Entered By: Kalman Shan on 07/05/2021 09:09:00 -------------------------------------------------------------------------------- SuperBill Details Patient Name: Date of Service: Larry Sickles RLES E. 07/05/2021 Medical Record Number: [id number] Patient Account Number: [id number] Date of Birth/Sex: Treating RN: July 14, 1945 (39 y.o. 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, left eye Facility Procedures CPT4 Code: [id number] Description: [id number] - DEBRIDE WOUND 1ST 20 SQ CM OR < ICD-10 Diagnosis Description L97.822 Non-pressure chronic ulcer of other part of left lower leg with fat layer expose Modifier: d Quantity: 1 Physician Procedures : CPT4 Code Description Modifier [id number] - WC PHYS DEBR WO ANESTH 20 SQ CM ICD-10 Diagnosis Description L97.822 Non-pressure chronic ulcer of other part of left lower leg with fat layer exposed Quantity: 1 Electronic Signature(s) Signed: 07/05/2021 9:14:58 AM By: Kalman Shan DO Entered By: Kalman Shan on 07/05/2021 09:14:38

## 2021-07-05 NOTE — Progress Notes (Signed)
ALOYSIUS, HEINLE ([id number]) Visit Report for 07/05/2021 Arrival Information Details Patient Name: Date of Service: Larry Welch 07/05/2021 8:15 A M Medical Record Number: [id number] Patient Account Number: [id number] Date of Birth/Sex: Treating RN: [birth year] (76 y.o. Collene Gobble Primary Care Morgyn Marut: Elyn Peers Other Clinician: Referring Zyona Pettaway: Treating Jessy Calixte/Extender: Jeannine Boga in Treatment: 12 Visit Information History Since Last Visit Added or deleted any medications: No Patient Arrived: Ambulatory Any new allergies or adverse reactions: No Arrival Time: 08:31 Had a fall or experienced change in No Accompanied By: self activities of daily living that may affect Transfer Assistance: None risk of falls: Patient Identification Verified: Yes Signs or symptoms of abuse/neglect since last visito No Patient Requires Transmission-Based Precautions: No Hospitalized since last visit: No Patient Has Alerts: Yes Implantable device outside of the clinic excluding No Patient Alerts: R ABI: 1.2 TBI: 0.88 cellular tissue based products placed in the center L ABI: 1.22 TBI: 1.07 since last visit: Has Dressing in Place as Prescribed: Yes Has Compression in Place as Prescribed: Yes Pain Present Now: No Electronic Signature(s) Signed: 07/05/2021 5:49:07 PM By: Dellie Catholic RN Entered By: Dellie Catholic on 07/05/2021 08:32:54 -------------------------------------------------------------------------------- Lower Extremity Assessment Details Patient Name: Date of Service: Larry Sickles RLES E. 07/05/2021 8:15 A M Medical Record Number: [id number] Patient Account Number: [id number] Date of Birth/Sex: Treating RN: 1946/02/15 (76 y.o. Collene Gobble Primary Care Jasamine Pottinger: Elyn Peers Other Clinician: Referring [name] [name]: Treating Jc Veron/Extender: Arnette Schaumann Weeks in Treatment: 12 Edema Assessment Assessed: [Left:  No] [Right: No] Edema: [Left: Ye] [Right: s] Calf Left: Right: Point of Measurement: 41 cm From Medial Instep 38 cm Ankle Left: Right: Point of Measurement: 8 cm From Medial Instep 23.5 cm Electronic Signature(s) Signed: 07/05/2021 5:49:07 PM By: Dellie Catholic RN Entered By: Dellie Catholic on 07/05/2021 08:38:39 -------------------------------------------------------------------------------- Multi Wound Chart Details Patient Name: Date of Service: Larry Sickles RLES E. 07/05/2021 8:15 A M Medical Record Number: [id number] Patient Account Number: [id number] Date of Birth/Sex: Treating RN: [birth year] (76 y.o. [name] Primary Care Kattaleya Alia: Elyn Peers Other Clinician: Referring Jesicca Dipierro: Treating Andie Mortimer/Extender: Arnette Schaumann Weeks in Treatment: 12 Vital Signs Height(in): 72 Pulse(bpm): 56 Weight(lbs): 195 Blood Pressure(mmHg): 153/88 Body Mass Index(BMI): 26.4 Temperature(F): 98.6 Respiratory Rate(breaths/min): 18 Photos: [N/A:N/A] Left, Anterior Lower Leg N/A N/A Wound Location: Trauma N/A N/A Wounding Event: Trauma, Other N/A N/A Primary Etiology: Hypertension N/A N/A Comorbid History: 02/08/2021 N/A N/A Date Acquired: 12 N/A N/A Weeks of Treatment: Open N/A N/A Wound Status: No N/A N/A Wound Recurrence: Yes N/A N/A Clustered Wound: 0.4x0.6x0.1 N/A N/A Measurements L x W x D (cm) 0.188 N/A N/A A (cm) : rea 0.019 N/A N/A Volume (cm) : 98.20% N/A N/A % Reduction in A rea: 99.80% N/A N/A % Reduction in Volume: Full Thickness Without Exposed N/A N/A Classification: Support Structures Medium N/A N/A Exudate A mount: Serosanguineous N/A N/A Exudate Type: red, brown N/A N/A Exudate Color: Distinct, outline attached N/A N/A Wound Margin: Large (67-100%) N/A N/A Granulation A mount: Red, Pink N/A N/A Granulation Quality: None Present (0%) N/A N/A Necrotic A mount: Fat Layer (Subcutaneous Tissue): Yes N/A  N/A Exposed Structures: Fascia: No Tendon: No Muscle: No Joint: No Bone: No Large (67-100%) N/A N/A Epithelialization: Debridement - Selective/Open Wound N/A N/A Debridement: Pre-procedure Verification/Time Out 08:45 N/A N/A Taken: Other N/A N/A Pain Control: Skin/Epidermis N/A N/A Level: 0.24 N/A N/A Debridement A (sq cm):  rea Curette N/A N/A Instrument: Minimum N/A N/A Bleeding: Pressure N/A N/A Hemostasis A chieved: 0 N/A N/A Procedural Pain: 0 N/A N/A Post Procedural Pain: Procedure was tolerated well N/A N/A Debridement Treatment Response: 0.4x0.6x0.1 N/A N/A Post Debridement Measurements L x W x D (cm) 0.019 N/A N/A Post Debridement Volume: (cm) Debridement N/A N/A Procedures Performed: Treatment Notes Electronic Signature(s) Signed: 07/05/2021 9:14:58 AM By: Kalman Shan DO Signed: 07/05/2021 6:13:38 PM By: Levan Hurst RN, BSN Entered By: Kalman Shan on 07/05/2021 09:07:52 -------------------------------------------------------------------------------- Multi-Disciplinary Care Plan Details Patient Name: Date of Service: Larry Sickles RLES E. 07/05/2021 8:15 A M Medical Record Number: [id number] Patient Account Number: [id number] Date of Birth/Sex: Treating RN: 05/14/1946 (76 y.o. Collene Gobble Primary Care Gionni Freese: Elyn Peers Other Clinician: Referring Amey Hossain: Treating Alfredo Spong/Extender: Jeannine Boga in Treatment: [place] reviewed with physician Active Inactive Wound/Skin Impairment Nursing Diagnoses: Impaired tissue integrity Knowledge deficit related to ulceration/compromised skin integrity Goals: Patient/caregiver will verbalize understanding of skin care regimen Date Initiated: 04/06/2021 Target Resolution Date: 08/06/2021 Goal Status: Active Ulcer/skin breakdown will have a volume reduction of 30% by week 4 Date Initiated: 04/06/2021 Date Inactivated: 05/10/2021 Target  Resolution Date: 05/10/2021 Goal Status: Met Ulcer/skin breakdown will have a volume reduction of 50% by week 8 Date Initiated: 04/06/2021 Target Resolution Date: 08/06/2021 Goal Status: Active Interventions: Assess patient/caregiver ability to obtain necessary supplies Assess patient/caregiver ability to perform ulcer/skin care regimen upon admission and as needed Assess ulceration(s) every visit Notes: Electronic Signature(s) Signed: 07/05/2021 5:49:07 PM By: Dellie Catholic RN Entered By: Dellie Catholic on 07/05/2021 08:45:04 -------------------------------------------------------------------------------- Pain Assessment Details Patient Name: Date of Service: Larry Sickles RLES E. 07/05/2021 8:15 A M Medical Record Number: [id number] Patient Account Number: [id number] Date of Birth/Sex: Treating RN: 01/29/1946 (76 y.o. Collene Gobble Primary Care Bettina Warn: Elyn Peers Other Clinician: Referring Severin Bou: Treating Nylen Creque/Extender: Arnette Schaumann Weeks in Treatment: 12 Active Problems Location of Pain Severity and Description of Pain Patient Has Paino No Site Locations Pain Management and Medication Current Pain Management: Electronic Signature(s) Signed: 07/05/2021 5:49:07 PM By: Dellie Catholic RN Entered By: Dellie Catholic on 07/05/2021 08:33:36 -------------------------------------------------------------------------------- Patient/Caregiver Education Details Patient Name: Date of Service: Larry Welch 1/26/2023andnbsp8:15 A M Medical Record Number: [id number] Patient Account Number: [id number] Date of Birth/Gender: Treating RN: 01-Sep-1945 (63 y.o. Collene Gobble Primary Care Physician: Elyn Peers Other Clinician: Referring Physician: Treating Physician/Extender: Jeannine Boga in Treatment: 12 Education Assessment Education Provided To: Patient Education Topics Provided Wound/Skin Impairment: Methods:  Explain/Verbal Responses: Return demonstration correctly Electronic Signature(s) Signed: 07/05/2021 5:49:07 PM By: Dellie Catholic RN Entered By: Dellie Catholic on 07/05/2021 08:46:15 -------------------------------------------------------------------------------- Wound Assessment Details Patient Name: Date of Service: Larry Sickles RLES E. 07/05/2021 8:15 A M Medical Record Number: [id number] Patient Account Number: [id number] Date of Birth/Sex: Treating RN: Welch 01, 1947 (76 y.o. Collene Gobble Primary Care Miciah Covelli: Elyn Peers Other Clinician: Referring Candace Begue: Treating Gotti Alwin/Extender: Arnette Schaumann Weeks in Treatment: 12 Wound Status Wound Number: 1 Primary Etiology: Trauma, Other Wound Location: Left, Anterior Lower Leg Wound Status: Open Wounding Event: Trauma Comorbid History: Hypertension Date Acquired: 02/08/2021 Weeks Of Treatment: 12 Clustered Wound: Yes Photos Wound Measurements Length: (cm) 0.4 Width: (cm) 0.6 Depth: (cm) 0.1 Area: (cm) 0.188 Volume: (cm) 0.019 % Reduction in Area: 98.2% % Reduction in Volume: 99.8% Epithelialization: Large (67-100%) Tunneling: No Undermining: No Wound Description Classification: Full Thickness  Without Exposed Support Structures Wound Margin: Distinct, outline attached Exudate Amount: Medium Exudate Type: Serosanguineous Exudate Color: red, brown Foul Odor After Cleansing: No Slough/Fibrino No Wound Bed Granulation Amount: Large (67-100%) Exposed Structure Granulation Quality: Red, Pink Fascia Exposed: No Necrotic Amount: None Present (0%) Fat Layer (Subcutaneous Tissue) Exposed: Yes Tendon Exposed: No Muscle Exposed: No Joint Exposed: No Bone Exposed: No Electronic Signature(s) Signed: 07/05/2021 5:49:07 PM By: Dellie Catholic RN Signed: 07/05/2021 6:13:38 PM By: Levan Hurst RN, BSN Entered By: Levan Hurst on 07/05/2021  08:42:20 -------------------------------------------------------------------------------- Vitals Details Patient Name: Date of Service: Larry Sickles RLES E. 07/05/2021 8:15 A M Medical Record Number: [id number] Patient Account Number: [id number] Date of Birth/Sex: Treating RN: [birth year] (76 y.o. Collene Gobble Primary Care Talmage Teaster: Elyn Peers Other Clinician: Referring Rockie Schnoor: Treating Kasch Borquez/Extender: Arnette Schaumann Weeks in Treatment: 12 Vital Signs Time Taken: 08:33 Temperature (F): 98.6 Height (in): 72 Pulse (bpm): 56 Weight (lbs): 195 Respiratory Rate (breaths/min): 18 Body Mass Index (BMI): 26.4 Blood Pressure (mmHg): 153/88 Reference Range: 80 - 120 mg / dl Electronic Signature(s) Signed: 07/05/2021 5:49:07 PM By: Dellie Catholic RN Entered By: Dellie Catholic on 07/05/2021 08:33:25

## 2021-07-10 NOTE — Progress Notes (Signed)
JOS, CYGAN ([id number]) Visit Report for 06/21/2021 Arrival Information Details Patient Name: Date of Service: Larry Welch 06/21/2021 8:15 A M Medical Record Number: [id number] Patient Account Number: [id number] Date of Birth/Sex: Treating RN: Jun 21, 1945 (76 y.o. Collene Gobble Primary Care Levonte Molina: Elyn Peers Other Clinician: Referring Kaye Mitro: Treating Korbin Notaro/Extender: Arnette Schaumann Weeks in Treatment: 10 Visit Information History Since Last Visit Added or deleted any medications: No Patient Arrived: Ambulatory Any new allergies or adverse reactions: No Arrival Time: 08:29 Had a fall or experienced change in No Accompanied By: self activities of daily living that may affect Transfer Assistance: None risk of falls: Patient Requires Transmission-Based Precautions: No Signs or symptoms of abuse/neglect since last visito No Patient Has Alerts: Yes Hospitalized since last visit: No Patient Alerts: R ABI: 1.2 TBI: 0.88 Implantable device outside of the clinic excluding No L ABI: 1.22 TBI: 1.07 cellular tissue based products placed in the center since last visit: Has Dressing in Place as Prescribed: Yes Has Compression in Place as Prescribed: Yes Pain Present Now: No Electronic Signature(s) Signed: 06/22/2021 1:28:52 PM By: Dellie Catholic RN Entered By: Dellie Catholic on 06/21/2021 08:30:39 -------------------------------------------------------------------------------- Clinic Level of Care Assessment Details Patient Name: Date of Service: [name] 06/21/2021 8:15 A M Medical Record Number: [id number] Patient Account Number: [id number] Date of Birth/Sex: Treating RN: [birth year] (76 y.o. [name] Primary Care Nakiyah Beverley: Elyn Peers Other Clinician: Referring Bartlett Enke: Treating Leandre Wien/Extender: Arnette Schaumann Weeks in Treatment: 10 Clinic Level of Care Assessment Items TOOL 4 Quantity Score X- 1  0 Use when only an EandM is performed on FOLLOW-UP visit ASSESSMENTS - Nursing Assessment / Reassessment X- 1 10 Reassessment of Co-morbidities (includes updates in patient status) X- 1 5 Reassessment of Adherence to Treatment Plan ASSESSMENTS - Wound and Skin A ssessment / Reassessment X - Simple Wound Assessment / Reassessment - one wound 1 5 []  - 0 Complex Wound Assessment / Reassessment - multiple wounds []  - 0 Dermatologic / Skin Assessment (not related to wound area) ASSESSMENTS - Focused Assessment []  - 0 Circumferential Edema Measurements - multi extremities []  - 0 Nutritional Assessment / Counseling / Intervention X- 1 5 Lower Extremity Assessment (monofilament, tuning fork, pulses) []  - 0 Peripheral Arterial Disease Assessment (using hand held doppler) ASSESSMENTS - Ostomy and/or Continence Assessment and Care []  - 0 Incontinence Assessment and Management []  - 0 Ostomy Care Assessment and Management (repouching, etc.) PROCESS - Coordination of Care X - Simple Patient / Family Education for ongoing care 1 15 []  - 0 Complex (extensive) Patient / Family Education for ongoing care X- 1 10 Staff obtains Programmer, systems, Records, T Results / Process Orders est []  - 0 Staff telephones HHA, Nursing Homes / Clarify orders / etc []  - 0 Routine Transfer to another Facility (non-emergent condition) []  - 0 Routine Hospital Admission (non-emergent condition) []  - 0 New Admissions / Biomedical engineer / Ordering NPWT Apligraf, etc. , []  - 0 Emergency Hospital Admission (emergent condition) X- 1 10 Simple Discharge Coordination []  - 0 Complex (extensive) Discharge Coordination PROCESS - Special Needs []  - 0 Pediatric / Minor Patient Management []  - 0 Isolation Patient Management []  - 0 Hearing / Language / Visual special needs []  - 0 Assessment of Community assistance (transportation, D/C planning, etc.) []  - 0 Additional assistance / Altered mentation []  -  0 Support Surface(s) Assessment (bed, cushion, seat, etc.) INTERVENTIONS - Wound Cleansing / Measurement X -  Simple Wound Cleansing - one wound 1 5 $R'[]'vd$  - 0 Complex Wound Cleansing - multiple wounds X- 1 5 Wound Imaging (photographs - any number of wounds) $RemoveBe'[]'NwCoRTCjU$  - 0 Wound Tracing (instead of photographs) X- 1 5 Simple Wound Measurement - one wound $RemoveB'[]'YQHaJcrt$  - 0 Complex Wound Measurement - multiple wounds INTERVENTIONS - Wound Dressings $RemoveBeforeD'[]'ivGKwVZsHYhELX$  - 0 Small Wound Dressing one or multiple wounds $RemoveBeforeD'[]'GlpeqbfhGHEctJ$  - 0 Medium Wound Dressing one or multiple wounds X- 1 20 Large Wound Dressing one or multiple wounds X- 1 5 Application of Medications - topical $RemoveB'[]'WSDFDLas$  - 0 Application of Medications - injection INTERVENTIONS - Miscellaneous $RemoveBeforeD'[]'ULtvTSHEZiWjEw$  - 0 External ear exam $Remove'[]'YrfBoNy$  - 0 Specimen Collection (cultures, biopsies, blood, body fluids, etc.) $RemoveBefor'[]'ljZcpBDhHqWz$  - 0 Specimen(s) / Culture(s) sent or taken to Lab for analysis $RemoveBefo'[]'uiaMzdTwusS$  - 0 Patient Transfer (multiple staff / Civil Service fast streamer / Similar devices) $RemoveBeforeDE'[]'AOhKBOJvFlbwuli$  - 0 Simple Staple / Suture removal (25 or less) $Remove'[]'xYFKHGX$  - 0 Complex Staple / Suture removal (26 or more) $Remove'[]'HUlTRki$  - 0 Hypo / Hyperglycemic Management (close monitor of Blood Glucose) $RemoveBefore'[]'njjwxCqNOayDV$  - 0 Ankle / Brachial Index (ABI) - do not check if billed separately X- 1 5 Vital Signs Has the patient been seen at the hospital within the last three years: Yes Total Score: 105 Level Of Care: New/Established - Level 3 Electronic Signature(s) Signed: 06/21/2021 6:02:53 PM By: Levan Hurst RN, BSN Entered By: Levan Hurst on 06/21/2021 12:47:40 -------------------------------------------------------------------------------- Encounter Discharge Information Details Patient Name: Date of Service: Larry Welch, [name] 06/21/2021 8:15 A M Medical Record Number: [id number] Patient Account Number: [id number] Date of Birth/Sex: Treating RN: Apr 07, 1946 (76 y.o. [name] Primary Care Taniqua Issa: Elyn Peers Other Clinician: Referring Nickolaus Bordelon: Treating  Sarah-Jane Nazario/Extender: Jeannine Boga in Treatment: 10 Encounter Discharge Information Items Discharge Condition: Stable Ambulatory Status: Ambulatory Discharge Destination: Home Transportation: Private Auto Accompanied By: alone Schedule Follow-up Appointment: Yes Clinical Summary of Care: Patient Declined Electronic Signature(s) Signed: 07/10/2021 10:55:30 AM By: Valeria Batman EMT Entered By: Valeria Batman on 06/21/2021 15:48:19 -------------------------------------------------------------------------------- Lower Extremity Assessment Details Patient Name: Date of Service: [name] 06/21/2021 8:15 A M Medical Record Number: [id number] Patient Account Number: [id number] Date of Birth/Sex: Treating RN: Sep 21, 1945 (76 y.o. Collene Gobble Primary Care Tiffny Gemmer: Elyn Peers Other Clinician: Referring Ashante Yellin: Treating Ajah Vanhoose/Extender: Arnette Schaumann Weeks in Treatment: 10 Edema Assessment Assessed: [Left: No] [Right: No] Edema: [Left: Ye] [Right: s] Calf Left: Right: Point of Measurement: 41 cm From Medial Instep 36.8 cm Ankle Left: Right: Point of Measurement: 8 cm From Medial Instep 24.8 cm Electronic Signature(s) Signed: 06/22/2021 1:28:52 PM By: Dellie Catholic RN Entered By: Dellie Catholic on 06/21/2021 08:35:27 -------------------------------------------------------------------------------- Multi Wound Chart Details Patient Name: Date of Service: [name] 06/21/2021 8:15 A M Medical Record Number: [id number] Patient Account Number: [id number] Date of Birth/Sex: Treating RN: 1945/12/16 (76 y.o. [name] Primary Care Pasqualino Witherspoon: Elyn Peers Other Clinician: Referring Shalisa Mcquade: Treating Demetris Capell/Extender: Arnette Schaumann Weeks in Treatment: 10 Vital Signs Height(in): 72 Pulse(bpm): 65 Weight(lbs): 195 Blood Pressure(mmHg): 109/68 Body Mass Index(BMI): 26 Temperature(F):  97.9 Respiratory Rate(breaths/min): 16 Photos: [N/A:N/A] Left, Anterior Lower Leg N/A N/A Wound Location: Trauma N/A N/A Wounding Event: Trauma, Other N/A N/A Primary Etiology: Hypertension N/A N/A Comorbid History: 02/08/2021 N/A N/A Date Acquired: 10 N/A N/A Weeks of Treatment: Open N/A N/A Wound Status: Yes N/A N/A Clustered Wound: 1.5x2.3x0.2 N/A N/A Measurements L x W  x D (cm) 2.71 N/A N/A A (cm) : rea 0.542 N/A N/A Volume (cm) : 73.90% N/A N/A % Reduction in A rea: 95.60% N/A N/A % Reduction in Volume: Full Thickness Without Exposed N/A N/A Classification: Support Structures Medium N/A N/A Exudate Amount: Serosanguineous N/A N/A Exudate Type: red, brown N/A N/A Exudate Color: Distinct, outline attached N/A N/A Wound Margin: Large (67-100%) N/A N/A Granulation Amount: Red, Pink, Hyper-granulation N/A N/A Granulation Quality: None Present (0%) N/A N/A Necrotic Amount: Fat Layer (Subcutaneous Tissue): Yes N/A N/A Exposed Structures: Fascia: No Tendon: No Muscle: No Joint: No Bone: No Small (1-33%) N/A N/A Epithelialization: Chemical Cauterization N/A N/A Procedures Performed: Treatment Notes Electronic Signature(s) Signed: 06/21/2021 9:22:49 AM By: Kalman Shan DO Signed: 06/21/2021 6:02:53 PM By: Levan Hurst RN, BSN Entered By: Kalman Shan on 06/21/2021 09:16:51 -------------------------------------------------------------------------------- Multi-Disciplinary Care Plan Details Patient Name: Date of Service: Larry Welch, [name] 06/21/2021 8:15 A M Medical Record Number: [id number] Patient Account Number: [id number] Date of Birth/Sex: Treating RN: July 07, 1945 (76 y.o. Collene Gobble Primary Care Aasiya Creasey: Elyn Peers Other Clinician: Referring Danahi Reddish: Treating La Shehan/Extender: Jeannine Boga in Treatment: [place] reviewed with physician Active Inactive Wound/Skin  Impairment Nursing Diagnoses: Impaired tissue integrity Knowledge deficit related to ulceration/compromised skin integrity Goals: Patient/caregiver will verbalize understanding of skin care regimen Date Initiated: 04/06/2021 Target Resolution Date: 07/06/2021 Goal Status: Active Ulcer/skin breakdown will have a volume reduction of 30% by week 4 Date Initiated: 04/06/2021 Date Inactivated: 05/10/2021 Target Resolution Date: 05/10/2021 Goal Status: Met Ulcer/skin breakdown will have a volume reduction of 50% by week 8 Date Initiated: 04/06/2021 Target Resolution Date: 07/06/2021 Goal Status: Active Interventions: Assess patient/caregiver ability to obtain necessary supplies Assess patient/caregiver ability to perform ulcer/skin care regimen upon admission and as needed Assess ulceration(s) every visit Notes: Electronic Signature(s) Signed: 06/22/2021 1:28:52 PM By: Dellie Catholic RN Entered By: Dellie Catholic on 06/21/2021 08:37:09 -------------------------------------------------------------------------------- Pain Assessment Details Patient Name: Date of Service: [name] 06/21/2021 8:15 A M Medical Record Number: [id number] Patient Account Number: [id number] Date of Birth/Sex: Treating RN: Oct 21, 1945 (38 y.o. Collene Gobble Primary Care Gae Bihl: Elyn Peers Other Clinician: Referring Michoel Kunin: Treating Marshun Duva/Extender: Arnette Schaumann Weeks in Treatment: 10 Active Problems Location of Pain Severity and Description of Pain Patient Has Paino No Site Locations Pain Management and Medication Current Pain Management: Electronic Signature(s) Signed: 06/22/2021 1:28:52 PM By: Dellie Catholic RN Entered By: Dellie Catholic on 06/21/2021 08:31:22 -------------------------------------------------------------------------------- Patient/Caregiver Education Details Patient Name: Date of Service: Larry Welch 1/12/2023andnbsp8:15 A M Medical  Record Number: [id number] Patient Account Number: [id number] Date of Birth/Gender: Treating RN: Welch 04, 1947 (66 y.o. Collene Gobble Primary Care Physician: Elyn Peers Other Clinician: Referring Physician: Treating Physician/Extender: Jeannine Boga in Treatment: 10 Education Assessment Education Provided To: Patient Education Topics Provided Electronic Signature(s) Signed: 06/22/2021 1:28:52 PM By: Dellie Catholic RN Entered By: Dellie Catholic on 06/21/2021 08:37:58 -------------------------------------------------------------------------------- Wound Assessment Details Patient Name: Date of Service: [name] 06/21/2021 8:15 A M Medical Record Number: [id number] Patient Account Number: [id number] Date of Birth/Sex: Treating RN: 1946-03-17 (76 y.o. [name] Primary Care Malikye Reppond: Elyn Peers Other Clinician: Referring Anson Peddie: Treating Cathy Ropp/Extender: Arnette Schaumann Weeks in Treatment: 10 Wound Status Wound Number: 1 Primary Etiology: Trauma, Other Wound Location: Left, Anterior Lower Leg Wound Status: Open Wounding Event: Trauma Comorbid History: Hypertension Date Acquired: 02/08/2021 [place]  Of Treatment: 10 Clustered Wound: Yes Photos Wound Measurements Length: (cm) 1.5 Width: (cm) 2.3 Depth: (cm) 0.2 Area: (cm) 2.71 Volume: (cm) 0.542 % Reduction in Area: 73.9% % Reduction in Volume: 95.6% Epithelialization: Small (1-33%) Wound Description Classification: Full Thickness Without Exposed Support Structures Wound Margin: Distinct, outline attached Exudate Amount: Medium Exudate Type: Serosanguineous Exudate Color: red, brown Foul Odor After Cleansing: No Slough/Fibrino No Wound Bed Granulation Amount: Large (67-100%) Exposed Structure Granulation Quality: Red, Pink, Hyper-granulation Fascia Exposed: No Necrotic Amount: None Present (0%) Fat Layer (Subcutaneous Tissue) Exposed: Yes Tendon  Exposed: No Muscle Exposed: No Joint Exposed: No Bone Exposed: No Electronic Signature(s) Signed: 06/21/2021 6:02:53 PM By: Levan Hurst RN, BSN Signed: 06/22/2021 1:28:52 PM By: Dellie Catholic RN Previous Signature: 06/21/2021 8:34:32 AM Version By: Valeria Batman EMT Entered By: Dellie Catholic on 06/21/2021 08:36:40 -------------------------------------------------------------------------------- Vitals Details Patient Name: Date of Service: Larry Welch, [name] 06/21/2021 8:15 A M Medical Record Number: [id number] Patient Account Number: [id number] Date of Birth/Sex: Treating RN: [birth year] (76 y.o. Collene Gobble Primary Care Carolyne Whitsel: Elyn Peers Other Clinician: Referring Deakon Frix: Treating Damyon Mullane/Extender: Arnette Schaumann Weeks in Treatment: 10 Vital Signs Time Taken: 08:30 Temperature (F): 97.9 Height (in): 72 Pulse (bpm): 65 Weight (lbs): 195 Respiratory Rate (breaths/min): 16 Body Mass Index (BMI): 26.4 Blood Pressure (mmHg): 109/68 Reference Range: 80 - 120 mg / dl Electronic Signature(s) Signed: 06/22/2021 1:28:52 PM By: Dellie Catholic RN Entered By: Dellie Catholic on 06/21/2021 08:31:13

## 2021-07-12 ENCOUNTER — Other Ambulatory Visit: Payer: Self-pay

## 2021-07-12 ENCOUNTER — Encounter (HOSPITAL_BASED_OUTPATIENT_CLINIC_OR_DEPARTMENT_OTHER): Payer: Medicare PPO | Attending: Internal Medicine | Admitting: Internal Medicine

## 2021-07-12 DIAGNOSIS — H20032 Secondary infectious iridocyclitis, left eye: Secondary | ICD-10-CM | POA: Insufficient documentation

## 2021-07-12 DIAGNOSIS — G4733 Obstructive sleep apnea (adult) (pediatric): Secondary | ICD-10-CM | POA: Insufficient documentation

## 2021-07-12 DIAGNOSIS — L97822 Non-pressure chronic ulcer of other part of left lower leg with fat layer exposed: Secondary | ICD-10-CM | POA: Diagnosis not present

## 2021-07-12 DIAGNOSIS — E11622 Type 2 diabetes mellitus with other skin ulcer: Secondary | ICD-10-CM | POA: Insufficient documentation

## 2021-07-12 DIAGNOSIS — I10 Essential (primary) hypertension: Secondary | ICD-10-CM | POA: Diagnosis not present

## 2021-07-13 NOTE — Progress Notes (Signed)
Larry Welch, Larry Welch ([id number]) Visit Report for 07/12/2021 Arrival Information Details Patient Name: Date of Service: Larry Welch RLES E. 07/12/2021 8:30 A M Medical Record Number: [id number] Patient Account Number: [id number] Date of Birth/Sex: Treating RN: 22-Dec-1945 (76 y.o. Larry Welch, Meta.Reding Primary Care Jonnatan Hanners: Elyn Peers Other Clinician: Referring Lyndy Russman: Treating Abra Lingenfelter/Extender: Jeannine Boga in Treatment: 13 Visit Information History Since Last Visit Added or deleted any medications: No Patient Arrived: Ambulatory Any new allergies or adverse reactions: No Arrival Time: 08:31 Had a fall or experienced change in No Accompanied By: self activities of daily living that may affect Transfer Assistance: None risk of falls: Patient Identification Verified: Yes Signs or symptoms of abuse/neglect since last visito No Patient Requires Transmission-Based Precautions: No Hospitalized since last visit: No Patient Has Alerts: Yes Implantable device outside of the clinic excluding No Patient Alerts: R ABI: 1.2 TBI: 0.88 cellular tissue based products placed in the center L ABI: 1.22 TBI: 1.07 since last visit: Has Dressing in Place as Prescribed: Yes Has Compression in Place as Prescribed: Yes Pain Present Now: No Electronic Signature(s) Signed: 07/12/2021 4:09:06 PM By: Deon Pilling RN, BSN Entered By: Deon Pilling on 07/12/2021 08:33:11 -------------------------------------------------------------------------------- Encounter Discharge Information Details Patient Name: Date of Service: Larry Welch, [name] 07/12/2021 8:30 A M Medical Record Number: [id number] Patient Account Number: [id number] Date of Birth/Sex: Treating RN: 09-03-1945 (76 y.o. [name] Primary Care Kashden Deboy: Elyn Peers Other Clinician: Referring Wilene Pharo: Treating Elliona Doddridge/Extender: Arnette Schaumann Weeks in Treatment: 13 Encounter Discharge Information  Items Discharge Condition: Stable Ambulatory Status: Ambulatory Discharge Destination: Home Transportation: Private Auto Schedule Follow-up Appointment: Yes Clinical Summary of Care: Provided on 07/12/2021 Form Type Recipient Paper Patient Patient Electronic Signature(s) Signed: 07/13/2021 1:32:11 PM By: Lorrin Jackson Entered By: Lorrin Jackson on 07/12/2021 09:32:40 -------------------------------------------------------------------------------- Lower Extremity Assessment Details Patient Name: Date of Service: Larry Welch RLES E. 07/12/2021 8:30 A M Medical Record Number: [id number] Patient Account Number: [id number] Date of Birth/Sex: Treating RN: 23-May-1946 (76 y.o. Larry Welch Primary Care Amritpal Shropshire: Elyn Peers Other Clinician: Referring Sharmon Cheramie: Treating Giulietta Prokop/Extender: Arnette Schaumann Weeks in Treatment: 13 Edema Assessment Assessed: [Left: Yes] [Right: No] Edema: [Left: N] [Right: o] Calf Left: Right: Point of Measurement: 41 cm From Medial Instep 37.5 cm Ankle Left: Right: Point of Measurement: 8 cm From Medial Instep 23.5 cm Vascular Assessment Pulses: Dorsalis Pedis Palpable: [Left:Yes] Electronic Signature(s) Signed: 07/12/2021 4:09:06 PM By: Deon Pilling RN, BSN Entered By: Deon Pilling on 07/12/2021 08:38:27 -------------------------------------------------------------------------------- Multi Wound Chart Details Patient Name: Date of Service: Larry Welch, [name] 07/12/2021 8:30 A M Medical Record Number: [id number] Patient Account Number: [id number] Date of Birth/Sex: Treating RN: [birth year] (76 y.o. Larry Welch, Meta.Reding Primary Care Quinn Quam: Elyn Peers Other Clinician: Referring Shalay Carder: Treating Shalamar Crays/Extender: Arnette Schaumann Weeks in Treatment: 13 Vital Signs Height(in): 72 Pulse(bpm): 54 Weight(lbs): 195 Blood Pressure(mmHg): 128/69 Body Mass Index(BMI): 26.4 Temperature(F): 97.3 Respiratory  Rate(breaths/min): 16 Photos: [1:Left, Anterior Lower Leg] [N/A:N/A N/A] Wound Location: [1:Trauma] [N/A:N/A] Wounding Event: [1:Trauma, Other] [N/A:N/A] Primary Etiology: [1:Hypertension] [N/A:N/A] Comorbid History: [1:02/08/2021] [N/A:N/A] Date Acquired: [1:13] [N/A:N/A] Weeks of Treatment: [1:Open] [N/A:N/A] Wound Status: [1:No] [N/A:N/A] Wound Recurrence: [1:Yes] [N/A:N/A] Clustered Wound: [1:0.6x0.7x0.1] [N/A:N/A] Measurements L x W x D (cm) [1:0.33] [N/A:N/A] A (cm) : rea [1:0.033] [N/A:N/A] Volume (cm) : [1:96.80%] [N/A:N/A] % Reduction in A rea: [1:99.70%] [N/A:N/A] % Reduction in Volume: [1:Full Thickness Without Exposed] [N/A:N/A]  Classification: [1:Support Structures Medium] [N/A:N/A] Exudate Amount: [1:Serosanguineous] [N/A:N/A] Exudate Type: [1:red, brown] [N/A:N/A] Exudate Color: [1:Distinct, outline attached] [N/A:N/A] Wound Margin: [1:Large (67-100%)] [N/A:N/A] Granulation Amount: [1:Red, Pink] [N/A:N/A] Granulation Quality: [1:None Present (0%)] [N/A:N/A] Necrotic Amount: [1:Fat Layer (Subcutaneous Tissue): Yes N/A] Exposed Structures: [1:Fascia: No Tendon: No Muscle: No Joint: No Bone: No Large (67-100%)] [N/A:N/A] Epithelialization: [1:Chemical Cauterization] [N/A:N/A] Treatment Notes Wound #1 (Lower Leg) Wound Laterality: Left, Anterior Cleanser Soap and Water Discharge Instruction: May shower and wash wound with dial antibacterial soap and water prior to dressing change. Wound Cleanser Discharge Instruction: Cleanse the wound with wound cleanser prior to applying a clean dressing using gauze sponges, not tissue or cotton balls. Peri-Wound Care Sween Lotion (Moisturizing lotion) Discharge Instruction: Apply moisturizing lotion as directed Topical Primary Dressing Hydrofera Blue Classic Foam, 2x2 in Discharge Instruction: Moisten with saline prior to applying to wound bed Secondary Dressing Woven Gauze Sponge, Non-Sterile 4x4 in Discharge Instruction:  Apply over primary dressing as directed. Secured With Compression Wrap Kerlix Roll 4.5x3.1 (in/yd) Discharge Instruction: Apply Kerlix and Coban compression as directed. Coban Self-Adherent Wrap 4x5 (in/yd) Discharge Instruction: Apply over Kerlix as directed. Compression Stockings Add-Ons Electronic Signature(s) Signed: 07/12/2021 10:01:24 AM By: Kalman Shan DO Signed: 07/12/2021 4:09:06 PM By: Deon Pilling RN, BSN Entered By: Kalman Shan on 07/12/2021 09:41:01 -------------------------------------------------------------------------------- Multi-Disciplinary Care Plan Details Patient Name: Date of Service: Larry Welch, [place] 07/12/2021 8:30 A M Medical Record Number: [id number] Patient Account Number: [id number] Date of Birth/Sex: Treating RN: [birth year] (76 y.o. [name] Primary Care Makenzey Nanni: Elyn Peers Other Clinician: Referring Taggart Prasad: Treating Taveon Enyeart/Extender: Jeannine Boga in Treatment: [place] reviewed with physician Active Inactive Wound/Skin Impairment Nursing Diagnoses: Impaired tissue integrity Knowledge deficit related to ulceration/compromised skin integrity Goals: Patient/caregiver will verbalize understanding of skin care regimen Date Initiated: 04/06/2021 Target Resolution Date: 08/06/2021 Goal Status: Active Ulcer/skin breakdown will have a volume reduction of 30% by week 4 Date Initiated: 04/06/2021 Date Inactivated: 05/10/2021 Target Resolution Date: 05/10/2021 Goal Status: Met Ulcer/skin breakdown will have a volume reduction of 50% by week 8 Date Initiated: 04/06/2021 Target Resolution Date: 08/06/2021 Goal Status: Active Interventions: Assess patient/caregiver ability to obtain necessary supplies Assess patient/caregiver ability to perform ulcer/skin care regimen upon admission and as needed Assess ulceration(s) every visit Notes: Electronic Signature(s) Signed: 07/13/2021 1:32:11  PM By: Lorrin Jackson Entered By: Lorrin Jackson on 07/12/2021 09:18:10 -------------------------------------------------------------------------------- Pain Assessment Details Patient Name: Date of Service: Larry Welch RLES E. 07/12/2021 8:30 A M Medical Record Number: [id number] Patient Account Number: [id number] Date of Birth/Sex: Treating RN: [birth year] (49 y.o. Larry Welch Primary Care Aniqa Hare: Elyn Peers Other Clinician: Referring Karry Barrilleaux: Treating Tadarrius Burch/Extender: Arnette Schaumann Weeks in Treatment: 13 Active Problems Location of Pain Severity and Description of Pain Patient Has Paino No Site Locations Rate the pain. Rate the pain. Current Pain Level: 0 Pain Management and Medication Current Pain Management: Medication: No Cold Application: No Rest: No Massage: No Activity: No T.E.N.S.: No Heat Application: No Leg drop or elevation: No Is the Current Pain Management Adequate: Adequate How does your wound impact your activities of daily livingo Sleep: No Bathing: No Appetite: No Relationship With Others: No Bladder Continence: No Emotions: No Bowel Continence: No Work: No Toileting: No Drive: No Dressing: No Hobbies: No Engineer, maintenance) Signed: 07/12/2021 4:09:06 PM By: Deon Pilling RN, BSN Entered By: Deon Pilling on 07/12/2021 08:35:26 -------------------------------------------------------------------------------- Patient/Caregiver Education Details Patient Name: Date of Service:  Larry Welch, [name] 2/2/2023andnbsp8:30 A M Medical Record Number: [id number] Patient Account Number: [id number] Date of Birth/Gender: Treating RN: September 01, 1945 (76 y.o. [name] Primary Care Physician: Elyn Peers Other Clinician: Referring Physician: Treating Physician/Extender: Jeannine Boga in Treatment: 13 Education Assessment Education Provided To: Patient Education Topics  Provided Venous: Methods: Explain/Verbal, Printed Responses: State content correctly Wound/Skin Impairment: Methods: Explain/Verbal, Printed Responses: State content correctly Electronic Signature(s) Signed: 07/13/2021 1:32:11 PM By: Lorrin Jackson Signed: 07/13/2021 1:32:11 PM By: Lorrin Jackson Entered By: Lorrin Jackson on 07/12/2021 09:20:00 -------------------------------------------------------------------------------- Wound Assessment Details Patient Name: Date of Service: [place] X, [place] 07/12/2021 8:30 A M Medical Record Number: [id number] Patient Account Number: [id number] Date of Birth/Sex: Treating RN: [birth year] (76 y.o. Larry Welch, Meta.Reding Primary Care Bassel Gaskill: Elyn Peers Other Clinician: Referring Leala Bryand: Treating Timmia Cogburn/Extender: Arnette Schaumann Weeks in Treatment: 13 Wound Status Wound Number: 1 Primary Etiology: Trauma, Other Wound Location: Left, Anterior Lower Leg Wound Status: Open Wounding Event: Trauma Comorbid History: Hypertension Date Acquired: 02/08/2021 Weeks Of Treatment: 13 Clustered Wound: Yes Photos Wound Measurements Length: (cm) 0.6 Width: (cm) 0.7 Depth: (cm) 0.1 Area: (cm) 0.33 Volume: (cm) 0.033 % Reduction in Area: 96.8% % Reduction in Volume: 99.7% Epithelialization: Large (67-100%) Tunneling: No Undermining: No Wound Description Classification: Full Thickness Without Exposed Support Structures Wound Margin: Distinct, outline attached Exudate Amount: Medium Exudate Type: Serosanguineous Exudate Color: red, brown Foul Odor After Cleansing: No Slough/Fibrino No Wound Bed Granulation Amount: Large (67-100%) Exposed Structure Granulation Quality: Red, Pink Fascia Exposed: No Necrotic Amount: None Present (0%) Fat Layer (Subcutaneous Tissue) Exposed: Yes Tendon Exposed: No Muscle Exposed: No Joint Exposed: No Bone Exposed: No Treatment Notes Wound #1 (Lower Leg) Wound Laterality: Left,  Anterior Cleanser Soap and Water Discharge Instruction: May shower and wash wound with dial antibacterial soap and water prior to dressing change. Wound Cleanser Discharge Instruction: Cleanse the wound with wound cleanser prior to applying a clean dressing using gauze sponges, not tissue or cotton balls. Peri-Wound Care Sween Lotion (Moisturizing lotion) Discharge Instruction: Apply moisturizing lotion as directed Topical Primary Dressing Hydrofera Blue Classic Foam, 2x2 in Discharge Instruction: Moisten with saline prior to applying to wound bed Secondary Dressing Woven Gauze Sponge, Non-Sterile 4x4 in Discharge Instruction: Apply over primary dressing as directed. Secured With Compression Wrap Kerlix Roll 4.5x3.1 (in/yd) Discharge Instruction: Apply Kerlix and Coban compression as directed. Coban Self-Adherent Wrap 4x5 (in/yd) Discharge Instruction: Apply over Kerlix as directed. Compression Stockings Add-Ons Electronic Signature(s) Signed: 07/12/2021 4:09:06 PM By: Deon Pilling RN, BSN Entered By: Deon Pilling on 07/12/2021 08:42:19 -------------------------------------------------------------------------------- Vitals Details Patient Name: Date of Service: Larry Welch, [name] 07/12/2021 8:30 A M Medical Record Number: [id number] Patient Account Number: [id number] Date of Birth/Sex: Treating RN: [birth year] (61 y.o. Larry Welch Primary Care Arrin Ishler: Elyn Peers Other Clinician: Referring Irlanda Croghan: Treating Ethal Gotay/Extender: Arnette Schaumann Weeks in Treatment: 13 Vital Signs Time Taken: 08:30 Temperature (F): 97.3 Height (in): 72 Pulse (bpm): 54 Weight (lbs): 195 Respiratory Rate (breaths/min): 16 Body Mass Index (BMI): 26.4 Blood Pressure (mmHg): 128/69 Reference Range: 80 - 120 mg / dl Electronic Signature(s) Signed: 07/12/2021 4:09:06 PM By: Deon Pilling RN, BSN Entered By: Deon Pilling on 07/12/2021 08:35:18

## 2021-07-13 NOTE — Progress Notes (Signed)
Larry Welch ([id number]) Visit Report for 07/12/2021 Chief Complaint Document Details Patient Name: Date of Service: Larry Welch RLES E. 07/12/2021 8:30 A M Medical Record Number: [id number] Patient Account Number: [id number] Date of Birth/Sex: Treating RN: [birth year] (76 y.o. Larry Welch Primary Care Provider: Elyn Peers Other Clinician: Referring Provider: Treating Provider/Extender: Arnette Schaumann Weeks in Treatment: 13 Information Obtained from: Patient Chief Complaint Left lower extremity wound Electronic Signature(s) Signed: 07/12/2021 10:01:24 AM By: Kalman Shan DO Entered By: Kalman Shan on 07/12/2021 09:41:19 -------------------------------------------------------------------------------- HPI Details Patient Name: Date of Service: Larry Welch RLES E. 07/12/2021 8:30 A M Medical Record Number: [id number] Patient Account Number: [id number] Date of Birth/Sex: Treating RN: 01-08-1946 (62 y.o. Larry Welch Primary Care Provider: Elyn Peers Other Clinician: Referring Provider: Treating Provider/Extender: Arnette Schaumann Weeks in Treatment: 13 History of Present Illness HPI Description: Admission 04/06/2021 Mr. Larry Welch is a 76 year old male with a past medical history of type 2 diabetes on oral medications, essential hypertension and OSA who presents to the clinic for a 3-month history of nonhealing wound to his left lower extremity. He states that he developed a wound from a pressure washer in the beginning of September. He states that the wound had improved to the point that it almost closed and then became significantly worse in the past couple weeks. He visited the ED on 04/04/2021 for this issue and was started on doxycycline. He currently denies signs of infection. 11/3; patient presents for follow-up. He has been using Hydrofera Blue without issues. He has no issues or complaints today. He denies signs of infection.  He does not have pain to the wound site. 11/10; patient presents for follow-up. He has no issues or complaints today. He tolerated the compression wrap well. He denies signs of infection. 11/17; patient presents for 1 week follow-up. He has tolerated the compression wrap well. He has no issues or complaints today and denies signs of infection. 12/1; patient presents for follow-up. He has no issues or complaints today. He denies signs of infection. 12/8; patient presents for follow-up. He has no issues or complaints today. He denies signs of infection. 12/15; patient presents for follow-up. He has no issues or complaints today. 12/22; patient presents for follow-up. He is tolerated the compression wrap well. He denies signs of infection. 12/30; patient presents for follow-up. He has no issues or complaints today. He denies signs of infection. 1/12; patient presents for follow-up. He has tolerated the compression wrap well. He is pleased with his wound healing thus far. He denies signs of infection. 1/26; patient presents for follow-up. He reports improvement in wound healing. He denies signs of infection. He has no issues or complaints today. 2/2; patient presents for follow-up. He has no issues or complaints today. He has tolerated the compression wrap well. Electronic Signature(s) Signed: 07/12/2021 10:01:24 AM By: Kalman Shan DO Entered By: Kalman Shan on 07/12/2021 09:41:41 -------------------------------------------------------------------------------- Chemical Cauterization Details Patient Name: Date of Service: KNO X, [place] 07/12/2021 8:30 A M Medical Record Number: [id number] Patient Account Number: [id number] Date of Birth/Sex: Treating RN: 1945-10-28 (5 y.o. Larry Welch Primary Care Provider: Elyn Peers Other Clinician: Referring Provider: Treating Provider/Extender: Arnette Schaumann Weeks in Treatment: 13 Procedure Performed for: Wound #1  Left,Anterior Lower Leg Performed By: Physician Kalman Shan, DO Post Procedure Diagnosis Same as Pre-procedure Electronic Signature(s) Signed: 07/12/2021 10:01:24 AM By: Kalman Shan DO Signed: 07/13/2021 1:32:11 PM  By: Fara Chute By: Lorrin Jackson on 07/12/2021 09:18:29 -------------------------------------------------------------------------------- Physical Exam Details Patient Name: Date of Service: Larry Welch RLES E. 07/12/2021 8:30 A M Medical Record Number: [id number] Patient Account Number: [id number] Date of Birth/Sex: Treating RN: Nov 30, 1945 (52 y.o. Larry Welch Primary Care Provider: Elyn Peers Other Clinician: Referring Provider: Treating Provider/Extender: Arnette Schaumann Weeks in Treatment: 13 Constitutional respirations regular, non-labored and within target range for patient.. Cardiovascular 2+ dorsalis pedis/posterior tibialis pulses. Psychiatric pleasant and cooperative. Notes Left lower extremity: Open wound with hyper granulated tissue. No undermining. No signs of infection. Appears well-healing. Good edema control. Electronic Signature(s) Signed: 07/12/2021 10:01:24 AM By: Kalman Shan DO Entered By: Kalman Shan on 07/12/2021 09:44:04 -------------------------------------------------------------------------------- Physician Orders Details Patient Name: Date of Service: [name], [place] 07/12/2021 8:30 A M Medical Record Number: [id number] Patient Account Number: [id number] Date of Birth/Sex: Treating RN: 23-Jul-1945 (54 y.o. Larry Welch Primary Care Provider: Elyn Peers Other Clinician: Referring Provider: Treating Provider/Extender: Arnette Schaumann Weeks in Treatment: 109 Verbal / Phone Orders: No Diagnosis Coding ICD-10 Coding Code Description [phone] Non-pressure chronic ulcer of other part of left lower leg with fat layer exposed E11.622 Type 2 diabetes mellitus with other skin  ulcer I10 Essential (primary) hypertension H20.032 Secondary infectious iridocyclitis, left eye Follow-up Appointments ppointment in 2 weeks. - Dr Heber [place] Leveda Anna) Return A Nurse Visit: - Next week for wrap change Bathing/ Shower/ Hygiene May shower with protection but do not get wound dressing(s) wet. Edema Control - Lymphedema / SCD / Other Elevate legs to the level of the heart or above for 30 minutes daily and/or when sitting, a frequency of: - throughout the day. Avoid standing for long periods of time. Exercise regularly Wound Treatment Wound #1 - Lower Leg Wound Laterality: Left, Anterior Cleanser: Soap and Water 1 x Per Week Discharge Instructions: May shower and wash wound with dial antibacterial soap and water prior to dressing change. Cleanser: Wound Cleanser 1 x Per Week Discharge Instructions: Cleanse the wound with wound cleanser prior to applying a clean dressing using gauze sponges, not tissue or cotton balls. Peri-Wound Care: Sween Lotion (Moisturizing lotion) 1 x Per Week Discharge Instructions: Apply moisturizing lotion as directed Prim Dressing: Hydrofera Blue Classic Foam, 2x2 in 1 x Per Week ary Discharge Instructions: Moisten with saline prior to applying to wound bed Secondary Dressing: Woven Gauze Sponge, Non-Sterile 4x4 in 1 x Per Week Discharge Instructions: Apply over primary dressing as directed. Compression Wrap: Kerlix Roll 4.5x3.1 (in/yd) 1 x Per Week Discharge Instructions: Apply Kerlix and Coban compression as directed. Compression Wrap: Coban Self-Adherent Wrap 4x5 (in/yd) 1 x Per Week Discharge Instructions: Apply over Kerlix as directed. Electronic Signature(s) Signed: 07/12/2021 10:01:24 AM By: Kalman Shan DO Entered By: Kalman Shan on 07/12/2021 09:45:25 -------------------------------------------------------------------------------- Problem List Details Patient Name: Date of Service: [name], [place] 07/12/2021 8:30 A M Medical  Record Number: [id number] Patient Account Number: [id number] Date of Birth/Sex: Treating RN: 1945/09/26 (44 y.o. Larry Welch Primary Care Provider: Elyn Peers Other Clinician: Referring Provider: Treating Provider/Extender: Arnette Schaumann Weeks in Treatment: 13 Active Problems ICD-10 Encounter Code Description Active Date MDM Diagnosis [phone] Non-pressure chronic ulcer of other part of left lower leg with fat layer 04/06/2021 No Yes exposed E11.622 Type 2 diabetes mellitus with other skin ulcer 04/19/2021 No Yes I10 Essential (primary) hypertension 04/06/2021 No Yes H20.032 Secondary infectious iridocyclitis, left eye 04/12/2021 No Yes  Inactive Problems Resolved Problems Electronic Signature(s) Signed: 07/12/2021 10:01:24 AM By: Kalman Shan DO Entered By: Kalman Shan on 07/12/2021 09:40:52 -------------------------------------------------------------------------------- Progress Note Details Patient Name: Date of Service: Larry Welch RLES E. 07/12/2021 8:30 A M Medical Record Number: [id number] Patient Account Number: [id number] Date of Birth/Sex: Treating RN: [birth year] (47 y.o. Larry Welch Primary Care Provider: Elyn Peers Other Clinician: Referring Provider: Treating Provider/Extender: Arnette Schaumann Weeks in Treatment: 13 Subjective Chief Complaint Information obtained from Patient Left lower extremity wound History of Present Illness (HPI) Admission 04/06/2021 Mr. Jerre Vandrunen is a 76 year old male with a past medical history of type 2 diabetes on oral medications, essential hypertension and OSA who presents to the clinic for a 78-month history of nonhealing wound to his left lower extremity. He states that he developed a wound from a pressure washer in the beginning of September. He states that the wound had improved to the point that it almost closed and then became significantly worse in the past couple weeks.  He visited the ED on 04/04/2021 for this issue and was started on doxycycline. He currently denies signs of infection. 11/3; patient presents for follow-up. He has been using Hydrofera Blue without issues. He has no issues or complaints today. He denies signs of infection. He does not have pain to the wound site. 11/10; patient presents for follow-up. He has no issues or complaints today. He tolerated the compression wrap well. He denies signs of infection. 11/17; patient presents for 1 week follow-up. He has tolerated the compression wrap well. He has no issues or complaints today and denies signs of infection. 12/1; patient presents for follow-up. He has no issues or complaints today. He denies signs of infection. 12/8; patient presents for follow-up. He has no issues or complaints today. He denies signs of infection. 12/15; patient presents for follow-up. He has no issues or complaints today. 12/22; patient presents for follow-up. He is tolerated the compression wrap well. He denies signs of infection. 12/30; patient presents for follow-up. He has no issues or complaints today. He denies signs of infection. 1/12; patient presents for follow-up. He has tolerated the compression wrap well. He is pleased with his wound healing thus far. He denies signs of infection. 1/26; patient presents for follow-up. He reports improvement in wound healing. He denies signs of infection. He has no issues or complaints today. 2/2; patient presents for follow-up. He has no issues or complaints today. He has tolerated the compression wrap well. Patient History Information obtained from Patient, Chart. Family History Unknown History. Social History Never smoker, Marital Status - Married, Alcohol Use - Rarely, Drug Use - No History, Caffeine Use - Rarely. Medical History Cardiovascular Patient has history of Hypertension Denies history of Angina, Arrhythmia, Congestive Heart Failure, Coronary Artery Disease, Deep  Vein Thrombosis, Hypotension, Myocardial Infarction, Peripheral Arterial Disease, Peripheral Venous Disease, Phlebitis, Vasculitis Integumentary (Skin) Denies history of History of Burn Hospitalization/Surgery History - back surgery/foot surgery/ radiation plant sreed. Medical A Surgical History Notes nd Cardiovascular hyperlipdemia Endocrine pre0diabetic Oncologic hx of prostate ca Objective Constitutional respirations regular, non-labored and within target range for patient.. Vitals Time Taken: 8:30 AM, Height: 72 in, Weight: 195 lbs, BMI: 26.4, Temperature: 97.3 F, Pulse: 54 bpm, Respiratory Rate: 16 breaths/min, Blood Pressure: 128/69 mmHg. Cardiovascular 2+ dorsalis pedis/posterior tibialis pulses. Psychiatric pleasant and cooperative. General Notes: Left lower extremity: Open wound with hyper granulated tissue. No undermining. No signs of infection. Appears well-healing. Good edema control. Integumentary (Hair, Skin)  Wound #1 status is Open. Original cause of wound was Trauma. The date acquired was: 02/08/2021. The wound has been in treatment 13 weeks. The wound is located on the Left,Anterior Lower Leg. The wound measures 0.6cm length x 0.7cm width x 0.1cm depth; 0.33cm^2 area and 0.033cm^3 volume. There is Fat Layer (Subcutaneous Tissue) exposed. There is no tunneling or undermining noted. There is a medium amount of serosanguineous drainage noted. The wound margin is distinct with the outline attached to the wound base. There is large (67-100%) red, pink granulation within the wound bed. There is no necrotic tissue within the wound bed. Assessment Active Problems ICD-10 Non-pressure chronic ulcer of other part of left lower leg with fat layer exposed Type 2 diabetes mellitus with other skin ulcer Essential (primary) hypertension Secondary infectious iridocyclitis, left eye Patient's wound has shown improvement in size and appearance since last clinic visit. I used silver  nitrate to the hyper granulated areas. I recommended continuing with Hydrofera Blue under compression therapy. I recommended next week for nurse visit for wrap change and in 2 weeks with me. I am hopeful it will be healed by then. Procedures Wound #1 Pre-procedure diagnosis of Wound #1 is a Trauma, Other located on the Left,Anterior Lower Leg . An Chemical Cauterization procedure was performed by Kalman Shan, DO. Post procedure Diagnosis Wound #1: Same as Pre-Procedure Plan Follow-up Appointments: Return Appointment in 2 weeks. - Dr Heber [place]  Leveda Anna) Nurse Visit: - Next week for wrap change Bathing/ Shower/ Hygiene: May shower with protection but do not get wound dressing(s) wet. Edema Control - Lymphedema / SCD / Other: Elevate legs to the level of the heart or above for 30 minutes daily and/or when sitting, a frequency of: - throughout the day. Avoid standing for long periods of time. Exercise regularly WOUND #1: - Lower Leg Wound Laterality: Left, Anterior Cleanser: Soap and Water 1 x Per Week/ Discharge Instructions: May shower and wash wound with dial antibacterial soap and water prior to dressing change. Cleanser: Wound Cleanser 1 x Per Week/ Discharge Instructions: Cleanse the wound with wound cleanser prior to applying a clean dressing using gauze sponges, not tissue or cotton balls. Peri-Wound Care: Sween Lotion (Moisturizing lotion) 1 x Per Week/ Discharge Instructions: Apply moisturizing lotion as directed Prim Dressing: Hydrofera Blue Classic Foam, 2x2 in 1 x Per Week/ ary Discharge Instructions: Moisten with saline prior to applying to wound bed Secondary Dressing: Woven Gauze Sponge, Non-Sterile 4x4 in 1 x Per Week/ Discharge Instructions: Apply over primary dressing as directed. Com pression Wrap: Kerlix Roll 4.5x3.1 (in/yd) 1 x Per Week/ Discharge Instructions: Apply Kerlix and Coban compression as directed. Com pression Wrap: Coban Self-Adherent Wrap 4x5 (in/yd) 1 x  Per Week/ Discharge Instructions: Apply over Kerlix as directed. 1. Silver nitrate 2. Hydrofera Blue under Kerlix/Coban 3. Follow-up next week for nurse visit and in 2 weeks with me Electronic Signature(s) Signed: 07/12/2021 10:01:24 AM By: Kalman Shan DO Entered By: Kalman Shan on 07/12/2021 09:46:33 -------------------------------------------------------------------------------- HxROS Details Patient Name: Date of Service: [name], [place] 07/12/2021 8:30 A M Medical Record Number: [id number] Patient Account Number: [id number] Date of Birth/Sex: Treating RN: [birth year] (39 y.o. Larry Welch Primary Care Provider: Elyn Peers Other Clinician: Referring Provider: Treating Provider/Extender: Arnette Schaumann Weeks in Treatment: 13 Information Obtained From Patient Chart Cardiovascular Medical History: Positive for: Hypertension Negative for: Angina; Arrhythmia; Congestive Heart Failure; Coronary Artery Disease; Deep Vein Thrombosis; Hypotension; Myocardial Infarction; Peripheral Arterial Disease; Peripheral Venous Disease;  Phlebitis; Vasculitis Past Medical History Notes: hyperlipdemia Endocrine Medical History: Past Medical History Notes: pre0diabetic Integumentary (Skin) Medical History: Negative for: History of Burn Oncologic Medical History: Past Medical History Notes: hx of prostate ca Immunizations Pneumococcal Vaccine: Received Pneumococcal Vaccination: No Implantable Devices None Hospitalization / Surgery History Type of Hospitalization/Surgery back surgery/foot surgery/ radiation plant sreed Family and Social History Unknown History: Yes; Never smoker; Marital Status - Married; Alcohol Use: Rarely; Drug Use: No History; Caffeine Use: Rarely; Financial Concerns: No; Food, Clothing or Shelter Needs: No; Support System Lacking: No; Transportation Concerns: No Electronic Signature(s) Signed: 07/12/2021 10:01:24 AM By: Kalman Shan DO Signed: 07/12/2021 4:09:06 PM By: Deon Pilling RN, BSN Entered By: Kalman Shan on 07/12/2021 09:43:10 -------------------------------------------------------------------------------- SuperBill Details Patient Name: Date of Service: Larry Welch RLES E. 07/12/2021 Medical Record Number: [id number] Patient Account Number: [id number] Date of Birth/Sex: Treating RN: 10-19-1945 (89 y.o. 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

## 2021-07-17 DIAGNOSIS — Z961 Presence of intraocular lens: Secondary | ICD-10-CM | POA: Diagnosis not present

## 2021-07-17 DIAGNOSIS — H33312 Horseshoe tear of retina without detachment, left eye: Secondary | ICD-10-CM | POA: Diagnosis not present

## 2021-07-17 DIAGNOSIS — R7989 Other specified abnormal findings of blood chemistry: Secondary | ICD-10-CM | POA: Diagnosis not present

## 2021-07-17 DIAGNOSIS — Z79899 Other long term (current) drug therapy: Secondary | ICD-10-CM | POA: Diagnosis not present

## 2021-07-17 DIAGNOSIS — H2511 Age-related nuclear cataract, right eye: Secondary | ICD-10-CM | POA: Diagnosis not present

## 2021-07-17 DIAGNOSIS — H35033 Hypertensive retinopathy, bilateral: Secondary | ICD-10-CM | POA: Diagnosis not present

## 2021-07-17 DIAGNOSIS — H209 Unspecified iridocyclitis: Secondary | ICD-10-CM | POA: Diagnosis not present

## 2021-07-17 DIAGNOSIS — H353131 Nonexudative age-related macular degeneration, bilateral, early dry stage: Secondary | ICD-10-CM | POA: Diagnosis not present

## 2021-07-19 ENCOUNTER — Encounter (HOSPITAL_BASED_OUTPATIENT_CLINIC_OR_DEPARTMENT_OTHER): Payer: Medicare PPO | Admitting: Internal Medicine

## 2021-07-19 ENCOUNTER — Other Ambulatory Visit: Payer: Self-pay

## 2021-07-19 DIAGNOSIS — E11622 Type 2 diabetes mellitus with other skin ulcer: Secondary | ICD-10-CM | POA: Diagnosis not present

## 2021-07-19 DIAGNOSIS — L97822 Non-pressure chronic ulcer of other part of left lower leg with fat layer exposed: Secondary | ICD-10-CM | POA: Diagnosis not present

## 2021-07-19 DIAGNOSIS — H20032 Secondary infectious iridocyclitis, left eye: Secondary | ICD-10-CM | POA: Diagnosis not present

## 2021-07-19 DIAGNOSIS — I10 Essential (primary) hypertension: Secondary | ICD-10-CM | POA: Diagnosis not present

## 2021-07-19 DIAGNOSIS — G4733 Obstructive sleep apnea (adult) (pediatric): Secondary | ICD-10-CM | POA: Diagnosis not present

## 2021-07-19 NOTE — Progress Notes (Signed)
HUE, FRICK ([id number]) Visit Report for 07/19/2021 Arrival Information Details Patient Name: Date of Service: Larry Welch 07/19/2021 8:30 A M Medical Record Number: [id number] Patient Account Number: [id number] Date of Birth/Sex: Treating RN: December 05, 1945 (76 y.o. [name] Primary Care Nephtali Docken: Elyn Peers Other Clinician: Referring Melba Araki: Treating Ren Grasse/Extender: Jeannine Boga in Treatment: 14 Visit Information History Since Last Visit Added or deleted any medications: No Patient Arrived: Ambulatory Any new allergies or adverse reactions: No Arrival Time: 08:44 Had a fall or experienced change in No Transfer Assistance: None activities of daily living that may affect Patient Identification Verified: Yes risk of falls: Secondary Verification Process Completed: Yes Signs or symptoms of abuse/neglect since last visito No Patient Requires Transmission-Based Precautions: No Hospitalized since last visit: No Patient Has Alerts: Yes Has Dressing in Place as Prescribed: Yes Patient Alerts: R ABI: 1.2 TBI: 0.88 Has Compression in Place as Prescribed: Yes L ABI: 1.22 TBI: 1.07 Pain Present Now: No Electronic Signature(s) Signed: 07/19/2021 4:48:24 PM By: Sharyn Creamer RN, BSN Entered By: Sharyn Creamer on 07/19/2021 08:44:57 -------------------------------------------------------------------------------- Clinic Level of Care Assessment Details Patient Name: Date of Service: Larry Welch RLES E. 07/19/2021 8:30 A M Medical Record Number: [id number] Patient Account Number: [id number] Date of Birth/Sex: Treating RN: 10-May-1946 (76 y.o. [name] Primary Care Carston Riedl: Elyn Peers Other Clinician: Referring Jarmon Javid: Treating Lasheika Ortloff/Extender: Arnette Schaumann Weeks in Treatment: 14 Clinic Level of Care Assessment Items TOOL 4 Quantity Score X- 1 0 Use when only an EandM is performed on FOLLOW-UP  visit ASSESSMENTS - Nursing Assessment / Reassessment []  - 0 Reassessment of Co-morbidities (includes updates in patient status) X- 1 5 Reassessment of Adherence to Treatment Plan ASSESSMENTS - Wound and Skin A ssessment / Reassessment X - Simple Wound Assessment / Reassessment - one wound 1 5 []  - 0 Complex Wound Assessment / Reassessment - multiple wounds X- 1 10 Dermatologic / Skin Assessment (not related to wound area) ASSESSMENTS - Focused Assessment []  - 0 Circumferential Edema Measurements - multi extremities []  - 0 Nutritional Assessment / Counseling / Intervention []  - 0 Lower Extremity Assessment (monofilament, tuning fork, pulses) []  - 0 Peripheral Arterial Disease Assessment (using hand held doppler) ASSESSMENTS - Ostomy and/or Continence Assessment and Care []  - 0 Incontinence Assessment and Management []  - 0 Ostomy Care Assessment and Management (repouching, etc.) PROCESS - Coordination of Care X - Simple Patient / Family Education for ongoing care 1 15 []  - 0 Complex (extensive) Patient / Family Education for ongoing care X- 1 10 Staff obtains Programmer, systems, Records, T Results / Process Orders est []  - 0 Staff telephones HHA, Nursing Homes / Clarify orders / etc []  - 0 Routine Transfer to another Facility (non-emergent condition) []  - 0 Routine Hospital Admission (non-emergent condition) []  - 0 New Admissions / Biomedical engineer / Ordering NPWT Apligraf, etc. , []  - 0 Emergency Hospital Admission (emergent condition) X- 1 10 Simple Discharge Coordination []  - 0 Complex (extensive) Discharge Coordination PROCESS - Special Needs []  - 0 Pediatric / Minor Patient Management []  - 0 Isolation Patient Management []  - 0 Hearing / Language / Visual special needs []  - 0 Assessment of Community assistance (transportation, D/C planning, etc.) []  - 0 Additional assistance / Altered mentation []  - 0 Support Surface(s) Assessment (bed, cushion, seat,  etc.) INTERVENTIONS - Wound Cleansing / Measurement X - Simple Wound Cleansing - one wound 1 5 []  - 0 Complex  Wound Cleansing - multiple wounds []  - 0 Wound Imaging (photographs - any number of wounds) []  - 0 Wound Tracing (instead of photographs) []  - 0 Simple Wound Measurement - one wound []  - 0 Complex Wound Measurement - multiple wounds INTERVENTIONS - Wound Dressings []  - 0 Small Wound Dressing one or multiple wounds []  - 0 Medium Wound Dressing one or multiple wounds X- 1 20 Large Wound Dressing one or multiple wounds []  - 0 Application of Medications - topical []  - 0 Application of Medications - injection INTERVENTIONS - Miscellaneous []  - 0 External ear exam []  - 0 Specimen Collection (cultures, biopsies, blood, body fluids, etc.) []  - 0 Specimen(s) / Culture(s) sent or taken to Lab for analysis []  - 0 Patient Transfer (multiple staff / Civil Service fast streamer / Similar devices) []  - 0 Simple Staple / Suture removal (25 or less) []  - 0 Complex Staple / Suture removal (26 or more) []  - 0 Hypo / Hyperglycemic Management (close monitor of Blood Glucose) []  - 0 Ankle / Brachial Index (ABI) - do not check if billed separately X- 1 5 Vital Signs Has the patient been seen at the hospital within the last three years: Yes Total Score: 85 Level Of Care: New/Established - Level 3 Electronic Signature(s) Signed: 07/19/2021 4:48:24 PM By: Sharyn Creamer RN, BSN Entered By: Sharyn Creamer on 07/19/2021 09:08:02 -------------------------------------------------------------------------------- Encounter Discharge Information Details Patient Name: Date of Service: Larry Welch RLES E. 07/19/2021 8:30 A M Medical Record Number: [id number] Patient Account Number: [id number] Date of Birth/Sex: Treating RN: 10-15-1945 (76 y.o. [name] Primary Care Willson Lipa: Elyn Peers Other Clinician: Referring Larry Welch: Treating [name]/Extender: Jeannine Boga in  Treatment: 14 Encounter Discharge Information Items Discharge Condition: Stable Ambulatory Status: Ambulatory Discharge Destination: Home Transportation: Private Auto Schedule Follow-up Appointment: Yes Clinical Summary of Care: Patient Declined Electronic Signature(s) Signed: 07/19/2021 4:48:24 PM By: Sharyn Creamer RN, BSN Entered By: Sharyn Creamer on 07/19/2021 09:05:38 -------------------------------------------------------------------------------- Patient/Caregiver Education Details Patient Name: Date of Service: Larry Welch 2/9/2023andnbsp8:30 A M Medical Record Number: [id number] Patient Account Number: [id number] Date of Birth/Gender: Treating RN: 01/07/1946 (68 y.o. [name] Primary Care Physician: Elyn Peers Other Clinician: Referring Physician: Treating Physician/Extender: Jeannine Boga in Treatment: 14 Education Assessment Education Provided To: Patient Education Topics Provided Wound/Skin Impairment: Methods: Explain/Verbal Responses: State content correctly [employer]) Signed: 07/19/2021 4:48:24 PM By: Sharyn Creamer RN, BSN Entered By: Sharyn Creamer on 07/19/2021 09:05:30 -------------------------------------------------------------------------------- Wound Assessment Details Patient Name: Date of Service: Larry Welch RLES E. 07/19/2021 8:30 A M Medical Record Number: [id number] Patient Account Number: [id number] Date of Birth/Sex: Treating RN: 1945/07/09 (76 y.o. [name] Primary Care Kwadwo Taras: Elyn Peers Other Clinician: Referring Zavon Hyson: Treating Dallana Mavity/Extender: Arnette Schaumann Weeks in Treatment: 14 Wound Status Wound Number: 1 Primary Etiology: Trauma, Other Wound Location: Left, Anterior Lower Leg Wound Status: Open Wounding Event: Trauma Comorbid History: Hypertension Date Acquired: 02/08/2021 Weeks Of Treatment: 14 Clustered Wound: Yes Wound  Measurements Length: (cm) 0.6 Width: (cm) 0.7 Depth: (cm) 0.1 Area: (cm) 0.33 Volume: (cm) 0.033 % Reduction in Area: 96.8% % Reduction in Volume: 99.7% Epithelialization: Large (67-100%) Tunneling: No Undermining: No Wound Description Classification: Full Thickness Without Exposed Support Structures Wound Margin: Distinct, outline attached Exudate Amount: Medium Exudate Type: Serosanguineous Exudate Color: red, brown Foul Odor After Cleansing: No Slough/Fibrino No Wound Bed Granulation Amount: Large (67-100%) Exposed Structure Granulation Quality: Red, Pink  Fascia Exposed: No Necrotic Amount: None Present (0%) Fat Layer (Subcutaneous Tissue) Exposed: Yes Tendon Exposed: No Muscle Exposed: No Joint Exposed: No Bone Exposed: No Treatment Notes Wound #1 (Lower Leg) Wound Laterality: Left, Anterior Cleanser Soap and Water Discharge Instruction: May shower and wash wound with dial antibacterial soap and water prior to dressing change. Wound Cleanser Discharge Instruction: Cleanse the wound with wound cleanser prior to applying a clean dressing using gauze sponges, not tissue or cotton balls. Peri-Wound Care Sween Lotion (Moisturizing lotion) Discharge Instruction: Apply moisturizing lotion as directed Topical Primary Dressing Hydrofera Blue Classic Foam, 2x2 in Discharge Instruction: Moisten with saline prior to applying to wound bed Secondary Dressing Woven Gauze Sponge, Non-Sterile 4x4 in Discharge Instruction: Apply over primary dressing as directed. Secured With Compression Wrap Kerlix Roll 4.5x3.1 (in/yd) Discharge Instruction: Apply Kerlix and Coban compression as directed. Coban Self-Adherent Wrap 4x5 (in/yd) Discharge Instruction: Apply over Kerlix as directed. Compression Stockings Add-Ons Electronic Signature(s) Signed: 07/19/2021 4:48:24 PM By: Sharyn Creamer RN, BSN Entered By: Sharyn Creamer on 07/19/2021  08:46:53 -------------------------------------------------------------------------------- [place] Details Patient Name: Date of Service: [name], [name] 07/19/2021 8:30 A M Medical Record Number: [id number] Patient Account Number: [id number] Date of Birth/Sex: Treating RN: 1945-07-07 (76 y.o. [name] Primary Care Tylon Kemmerling: Elyn Peers Other Clinician: Referring Banjamin Stovall: Treating Talor Cheema/Extender: Arnette Schaumann Weeks in Treatment: 14 Vital Signs Time Taken: 08:30 Temperature (F): 97.9 Height (in): 72 Pulse (bpm): 57 Weight (lbs): 195 Respiratory Rate (breaths/min): 18 Body Mass Index (BMI): 26.4 Blood Pressure (mmHg): 136/69 Reference Range: 80 - 120 mg / dl Electronic Signature(s) Signed: 07/19/2021 4:48:24 PM By: Sharyn Creamer RN, BSN Entered By: Sharyn Creamer on 07/19/2021 08:46:02

## 2021-07-19 NOTE — Progress Notes (Signed)
[name], [name] ([id number]) Visit Report for 07/19/2021 SuperBill Details Patient Name: Date of Service: Larry Welch July 07/19/2021 Medical Record Number: [id number] Patient Account Number: [id number] Date of Birth/Sex: Treating RN: [birth year] (76 y.o. 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

## 2021-07-26 ENCOUNTER — Encounter (HOSPITAL_BASED_OUTPATIENT_CLINIC_OR_DEPARTMENT_OTHER): Payer: Medicare PPO | Admitting: Internal Medicine

## 2021-07-26 ENCOUNTER — Other Ambulatory Visit: Payer: Self-pay

## 2021-07-26 DIAGNOSIS — I10 Essential (primary) hypertension: Secondary | ICD-10-CM | POA: Diagnosis not present

## 2021-07-26 DIAGNOSIS — H20032 Secondary infectious iridocyclitis, left eye: Secondary | ICD-10-CM

## 2021-07-26 DIAGNOSIS — L97822 Non-pressure chronic ulcer of other part of left lower leg with fat layer exposed: Secondary | ICD-10-CM | POA: Diagnosis not present

## 2021-07-26 DIAGNOSIS — E11622 Type 2 diabetes mellitus with other skin ulcer: Secondary | ICD-10-CM

## 2021-07-26 DIAGNOSIS — G4733 Obstructive sleep apnea (adult) (pediatric): Secondary | ICD-10-CM | POA: Diagnosis not present

## 2021-07-26 NOTE — Progress Notes (Signed)
Larry Welch ([id number]) Visit Report for 07/26/2021 Chief Complaint Document Details Patient Name: Date of Service: Larry Welch RLES Larry Welch Medical Record Number: [id number] Patient Account Number: [id number] Date of Birth/Sex: Treating RN: 13-Dec-1945 (76 y.o. Larry Welch Primary Care Provider: Elyn Welch Other Clinician: Referring Provider: Treating Provider/Extender: Larry Welch Weeks in Treatment: 15 Information Obtained from: Patient Chief Complaint Left lower extremity wound Electronic Signature(s) Signed: 07/26/2021 9:28:12 AM By: Larry Shan DO Entered By: Larry Welch on 07/26/2021 09:24:57 -------------------------------------------------------------------------------- HPI Details Patient Name: Date of Service: Larry Welch, [name] RLES Larry Welch Medical Record Number: [id number] Patient Account Number: [id number] Date of Birth/Sex: Treating RN: 1945/09/19 (76 y.o. Larry Welch Primary Care Provider: Elyn Welch Other Clinician: Referring Provider: Treating Provider/Extender: Larry Welch Weeks in Treatment: 15 History of Present Illness HPI Description: Admission 04/06/2021 Larry Welch is a 76 year old male with a past medical history of type 2 diabetes on oral medications, essential hypertension and OSA who presents to the clinic for a 33-month history of nonhealing wound to his left lower extremity. He states that he developed a wound from a pressure washer in the beginning of September. He states that the wound had improved to the point that it almost closed and then became significantly worse in the past couple weeks. He visited the ED on 04/04/2021 for this issue and was started on doxycycline. He currently denies signs of infection. 11/3; patient presents for follow-up. He has been using Hydrofera Blue without issues. He has no issues or complaints today. He denies signs of  infection. He does not have pain to the wound site. 11/10; patient presents for follow-up. He has no issues or complaints today. He tolerated the compression wrap well. He denies signs of infection. 11/17; patient presents for 1 week follow-up. He has tolerated the compression wrap well. He has no issues or complaints today and denies signs of infection. 12/1; patient presents for follow-up. He has no issues or complaints today. He denies signs of infection. 12/8; patient presents for follow-up. He has no issues or complaints today. He denies signs of infection. 12/15; patient presents for follow-up. He has no issues or complaints today. 12/22; patient presents for follow-up. He is tolerated the compression wrap well. He denies signs of infection. 12/30; patient presents for follow-up. He has no issues or complaints today. He denies signs of infection. 1/12; patient presents for follow-up. He has tolerated the compression wrap well. He is pleased with his wound healing thus far. He denies signs of infection. 1/26; patient presents for follow-up. He reports improvement in wound healing. He denies signs of infection. He has no issues or complaints today. 2/2; patient presents for follow-up. He has no issues or complaints today. He has tolerated the compression wrap well. 2/16; patient presents for follow-up. He has no issues or complaints today. Electronic Signature(s) Signed: 07/26/2021 9:28:12 AM By: Larry Shan DO Entered By: Larry Welch on 07/26/2021 09:25:13 -------------------------------------------------------------------------------- Physical Exam Details Patient Name: Date of Service: Larry Welch RLES Larry Welch Medical Record Number: [id number] Patient Account Number: [id number] Date of Birth/Sex: Treating RN: 1945/09/23 (76 y.o. Larry Welch Primary Care Provider: Elyn Welch Other Clinician: Referring Provider: Treating Provider/Extender: Larry Welch Weeks in Treatment: 15 Constitutional respirations regular, non-labored and within target range for patient.. Cardiovascular 2+ dorsalis pedis/posterior tibialis pulses. Psychiatric pleasant and cooperative. Notes Left  lower extremity: Epithelization and scab over the previous wound site. No drainage. Surrounding skin intact. Electronic Signature(s) Signed: 07/26/2021 9:28:12 AM By: Larry Shan DO Entered By: Larry Welch on 07/26/2021 09:25:50 -------------------------------------------------------------------------------- Physician Orders Details Patient Name: Date of Service: Larry Welch, [name] RLES Larry Welch Medical Record Number: [id number] Patient Account Number: [id number] Date of Birth/Sex: Treating RN: [birth year] (76 y.o. [name], Larry Welch Primary Care Provider: Elyn Welch Other Clinician: Referring Provider: Treating Provider/Extender: Larry Welch Weeks in Treatment: 15 Verbal / Phone Orders: No Diagnosis Coding Discharge From [facility] Discharge from [place] Signature(s) Signed: 07/26/2021 9:28:12 AM By: Larry Shan DO Entered By: Larry Welch on 07/26/2021 09:26:03 -------------------------------------------------------------------------------- Problem List Details Patient Name: Date of Service: Larry Welch RLES Larry Welch Medical Record Number: [id number] Patient Account Number: [id number] Date of Birth/Sex: Treating RN: March 07, 1946 (76 y.o. Larry Welch Primary Care Provider: Elyn Welch Other Clinician: Referring Provider: Treating Provider/Extender: Larry Welch Weeks in Treatment: 15 Active Problems ICD-10 Encounter Code Description Active Date MDM Diagnosis [phone] Non-pressure chronic ulcer of other part of left lower leg with fat layer 04/06/2021 No Yes exposed E11.622 Type 2 diabetes mellitus with other skin ulcer  04/19/2021 No Yes I10 Essential (primary) hypertension 04/06/2021 No Yes H20.032 Secondary infectious iridocyclitis, left eye 04/12/2021 No Yes Inactive Problems Resolved Problems Electronic Signature(s) Signed: 07/26/2021 9:28:12 AM By: Larry Shan DO Entered By: Larry Welch on 07/26/2021 09:24:43 -------------------------------------------------------------------------------- Progress Note Details Patient Name: Date of Service: Larry Welch RLES Larry Welch Medical Record Number: [id number] Patient Account Number: [id number] Date of Birth/Sex: Treating RN: 09-20-1945 (76 y.o. Larry Welch Primary Care Provider: Elyn Welch Other Clinician: Referring Provider: Treating Provider/Extender: Larry Welch Weeks in Treatment: 15 Subjective Chief Complaint Information obtained from Patient Left lower extremity wound History of Present Illness (HPI) Admission 04/06/2021 Larry Welch is a 76 year old male with a past medical history of type 2 diabetes on oral medications, essential hypertension and OSA who presents to the clinic for a 37-month history of nonhealing wound to his left lower extremity. He states that he developed a wound from a pressure washer in the beginning of September. He states that the wound had improved to the point that it almost closed and then became significantly worse in the past couple weeks. He visited the ED on 04/04/2021 for this issue and was started on doxycycline. He currently denies signs of infection. 11/3; patient presents for follow-up. He has been using Hydrofera Blue without issues. He has no issues or complaints today. He denies signs of infection. He does not have pain to the wound site. 11/10; patient presents for follow-up. He has no issues or complaints today. He tolerated the compression wrap well. He denies signs of infection. 11/17; patient presents for 1 week follow-up. He has tolerated the  compression wrap well. He has no issues or complaints today and denies signs of infection. 12/1; patient presents for follow-up. He has no issues or complaints today. He denies signs of infection. 12/8; patient presents for follow-up. He has no issues or complaints today. He denies signs of infection. 12/15; patient presents for follow-up. He has no issues or complaints today. 12/22; patient presents for follow-up. He is tolerated the compression wrap well. He denies signs of infection. 12/30; patient presents for follow-up. He has no issues or complaints today. He denies signs of infection. 1/12;  patient presents for follow-up. He has tolerated the compression wrap well. He is pleased with his wound healing thus far. He denies signs of infection. 1/26; patient presents for follow-up. He reports improvement in wound healing. He denies signs of infection. He has no issues or complaints today. 2/2; patient presents for follow-up. He has no issues or complaints today. He has tolerated the compression wrap well. 2/16; patient presents for follow-up. He has no issues or complaints today. Patient History Information obtained from Patient, Chart. Family History Unknown History. Social History Never smoker, Marital Status - Married, Alcohol Use - Rarely, Drug Use - No History, Caffeine Use - Rarely. Medical History Cardiovascular Patient has history of Hypertension Denies history of Angina, Arrhythmia, Congestive Heart Failure, Coronary Artery Disease, Deep Vein Thrombosis, Hypotension, Myocardial Infarction, Peripheral Arterial Disease, Peripheral Venous Disease, Phlebitis, Vasculitis Integumentary (Skin) Denies history of History of Burn Hospitalization/Surgery History - back surgery/foot surgery/ radiation plant sreed. Medical A Surgical History Notes nd Cardiovascular hyperlipdemia Endocrine pre0diabetic Oncologic hx of prostate ca Objective Constitutional respirations regular,  non-labored and within target range for patient.. Vitals Time Taken: 8:41 AM, Height: 72 in, Weight: 195 lbs, BMI: 26.4, Temperature: 97.6 F, Pulse: 60 bpm, Respiratory Rate: 17 breaths/min, Blood Pressure: 144/74 mmHg. Cardiovascular 2+ dorsalis pedis/posterior tibialis pulses. Psychiatric pleasant and cooperative. General Notes: Left lower extremity: Epithelization and scab over the previous wound site. No drainage. Surrounding skin intact. Integumentary (Hair, Skin) Wound #1 status is Open. Original cause of wound was Trauma. The date acquired was: 02/08/2021. The wound has been in treatment 15 weeks. The wound is located on the Left,Anterior Lower Leg. The wound measures 0cm length x 0cm width x 0cm depth; 0cm^2 area and 0cm^3 volume. There is Fat Layer (Subcutaneous Tissue) exposed. There is a medium amount of serosanguineous drainage noted. The wound margin is distinct with the outline attached to the wound base. There is large (67-100%) red, pink granulation within the wound bed. There is no necrotic tissue within the wound bed. Assessment Active Problems ICD-10 Non-pressure chronic ulcer of other part of left lower leg with fat layer exposed Type 2 diabetes mellitus with other skin ulcer Essential (primary) hypertension Secondary infectious iridocyclitis, left eye Patient has done well with Hydrofera Blue. His wound is healed. I recommended Vaseline nightly. Follow-up as needed. Plan Discharge From [facility] Services: Discharge from [place] 1. Discharge from clinic due to close wound 2. Follow-up as needed 3. Vaseline nightly Electronic Signature(s) Signed: 07/26/2021 9:28:12 AM By: Larry Shan DO Entered By: Larry Welch on 07/26/2021 09:27:36 -------------------------------------------------------------------------------- HxROS Details Patient Name: Date of Service: Larry Welch, [name] RLES Larry Welch Medical Record Number: [id number] Patient Account Number:  [id number] Date of Birth/Sex: Treating RN: 02/01/1946 (76 y.o. Larry Welch Primary Care Provider: Elyn Welch Other Clinician: Referring Provider: Treating Provider/Extender: Larry Welch Weeks in Treatment: 15 Information Obtained From Patient Chart Cardiovascular Medical History: Positive for: Hypertension Negative for: Angina; Arrhythmia; Congestive Heart Failure; Coronary Artery Disease; Deep Vein Thrombosis; Hypotension; Myocardial Infarction; Peripheral Arterial Disease; Peripheral Venous Disease; Phlebitis; Vasculitis Past Medical History Notes: hyperlipdemia Endocrine Medical History: Past Medical History Notes: pre0diabetic Integumentary (Skin) Medical History: Negative for: History of Burn Oncologic Medical History: Past Medical History Notes: hx of prostate ca Immunizations Pneumococcal Vaccine: Received Pneumococcal Vaccination: No Implantable Devices None Hospitalization / Surgery History Type of Hospitalization/Surgery back surgery/foot surgery/ radiation plant sreed Family and Social History Unknown History: Yes; Never smoker; Marital Status - Married;  Alcohol Use: Rarely; Drug Use: No History; Caffeine Use: Rarely; Financial Concerns: No; Food, Clothing or Shelter Needs: No; Support System Lacking: No; Transportation Concerns: No Electronic Signature(s) Signed: 07/26/2021 9:28:12 AM By: Larry Shan DO Signed: 07/26/2021 6:28:13 PM By: Levan Hurst RN, BSN Entered By: Larry Welch on 07/26/2021 09:25:20 -------------------------------------------------------------------------------- SuperBill Details Patient Name: Date of Service: Larry Welch RLES E. 07/26/2021 Medical Record Number: [id number] Patient Account Number: [id number] Date of Birth/Sex: Treating RN: [birth year] (76 y.o. [name], Larry Welch Primary Care Provider: Elyn Welch Other Clinician: Referring Provider: Treating Provider/Extender: Larry Welch Weeks in Treatment: 15 Diagnosis Coding ICD-10 Codes Code Description [phone] Non-pressure chronic ulcer of other part of left lower leg with fat layer exposed E11.622 Type 2 diabetes mellitus with other skin ulcer I10 Essential (primary) hypertension H20.032 Secondary infectious iridocyclitis, left eye Facility Procedures CPT4 Code: [id number] Description: 99213 - WOUND CARE VISIT-LEV 3 EST PT Modifier: Quantity: 1 Physician Procedures : CPT4 Code Description Modifier [id number] - WC PHYS LEVEL 3 - EST PT ICD-10 Diagnosis Description L97.822 Non-pressure chronic ulcer of other part of left lower leg with fat layer exposed E11.622 Type 2 diabetes mellitus with other skin ulcer I10  Essential (primary) hypertension H20.032 Secondary infectious iridocyclitis, left eye Quantity: 1 Electronic Signature(s) Signed: 07/26/2021 9:28:12 AM By: Larry Shan DO Entered By: Larry Welch on 07/26/2021 09:27:49

## 2021-07-26 NOTE — Progress Notes (Signed)
JAMORION, GOMILLION ([id number]) Visit Report for 07/26/2021 Arrival Information Details Patient Name: Date of Service: [name] 07/26/2021 8:30 A M Medical Record Number: [id number] Patient Account Number: [id number] Date of Birth/Sex: Treating RN: Nov 19, 1945 (76 y.o. [name], [name] Primary Care Kimber Esterly: Elyn Peers Other Clinician: Referring Inita Uram: Treating Trystyn Dolley/Extender: Arnette Schaumann Weeks in Treatment: 15 Visit Information History Since Last Visit Added or deleted any medications: No Patient Arrived: Ambulatory Any new allergies or adverse reactions: No Arrival Time: 08:39 Had a fall or experienced change in No Accompanied By: self activities of daily living that may affect Transfer Assistance: None risk of falls: Patient Identification Verified: Yes Signs or symptoms of abuse/neglect since last visito No Secondary Verification Process Completed: Yes Hospitalized since last visit: No Patient Requires Transmission-Based Precautions: No Implantable device outside of the clinic excluding No Patient Has Alerts: Yes cellular tissue based products placed in the center Patient Alerts: R ABI: 1.2 TBI: 0.88 since last visit: L ABI: 1.22 TBI: 1.07 Has Dressing in Place as Prescribed: Yes Has Compression in Place as Prescribed: Yes Pain Present Now: Yes Electronic Signature(s) Signed: 07/26/2021 5:08:54 PM By: Rhae Hammock RN Entered By: Rhae Hammock on 07/26/2021 11:51:29 -------------------------------------------------------------------------------- Clinic Level of Care Assessment Details Patient Name: Date of Service: [name] 07/26/2021 8:30 A M Medical Record Number: [id number] Patient Account Number: [id number] Date of Birth/Sex: Treating RN: [birth year] (76 y.o. [name], [name] Primary Care General Wearing: Elyn Peers Other Clinician: Referring Gleason Ardoin: Treating Shadiyah Wernli/Extender: Arnette Schaumann Weeks in Treatment: 15 Clinic Level of Care Assessment Items TOOL 4 Quantity Score X- 1 0 Use when only an EandM is performed on FOLLOW-UP visit ASSESSMENTS - Nursing Assessment / Reassessment X- 1 10 Reassessment of Co-morbidities (includes updates in patient status) X- 1 5 Reassessment of Adherence to Treatment Plan ASSESSMENTS - Wound and Skin A ssessment / Reassessment X - Simple Wound Assessment / Reassessment - one wound 1 5 []  - 0 Complex Wound Assessment / Reassessment - multiple wounds []  - 0 Dermatologic / Skin Assessment (not related to wound area) ASSESSMENTS - Focused Assessment X- 1 5 Circumferential Edema Measurements - multi extremities []  - 0 Nutritional Assessment / Counseling / Intervention []  - 0 Lower Extremity Assessment (monofilament, tuning fork, pulses) []  - 0 Peripheral Arterial Disease Assessment (using hand held doppler) ASSESSMENTS - Ostomy and/or Continence Assessment and Care []  - 0 Incontinence Assessment and Management []  - 0 Ostomy Care Assessment and Management (repouching, etc.) PROCESS - Coordination of Care X - Simple Patient / Family Education for ongoing care 1 15 []  - 0 Complex (extensive) Patient / Family Education for ongoing care X- 1 10 Staff obtains Programmer, systems, Records, T Results / Process Orders est []  - 0 Staff telephones HHA, Nursing Homes / Clarify orders / etc []  - 0 Routine Transfer to another Facility (non-emergent condition) []  - 0 Routine Hospital Admission (non-emergent condition) []  - 0 New Admissions / Biomedical engineer / Ordering NPWT Apligraf, etc. , []  - 0 Emergency Hospital Admission (emergent condition) X- 1 10 Simple Discharge Coordination []  - 0 Complex (extensive) Discharge Coordination PROCESS - Special Needs []  - 0 Pediatric / Minor Patient Management []  - 0 Isolation Patient Management []  - 0 Hearing / Language / Visual special needs []  - 0 Assessment of Community assistance  (transportation, D/C planning, etc.) []  - 0 Additional assistance / Altered mentation []  - 0 Support Surface(s) Assessment (bed, cushion, seat,  etc.) INTERVENTIONS - Wound Cleansing / Measurement X - Simple Wound Cleansing - one wound 1 5 []  - 0 Complex Wound Cleansing - multiple wounds X- 1 5 Wound Imaging (photographs - any number of wounds) []  - 0 Wound Tracing (instead of photographs) X- 1 5 Simple Wound Measurement - one wound []  - 0 Complex Wound Measurement - multiple wounds INTERVENTIONS - Wound Dressings []  - 0 Small Wound Dressing one or multiple wounds []  - 0 Medium Wound Dressing one or multiple wounds []  - 0 Large Wound Dressing one or multiple wounds []  - 0 Application of Medications - topical []  - 0 Application of Medications - injection INTERVENTIONS - Miscellaneous []  - 0 External ear exam []  - 0 Specimen Collection (cultures, biopsies, blood, body fluids, etc.) []  - 0 Specimen(s) / Culture(s) sent or taken to Lab for analysis []  - 0 Patient Transfer (multiple staff / Civil Service fast streamer / Similar devices) []  - 0 Simple Staple / Suture removal (25 or less) []  - 0 Complex Staple / Suture removal (26 or more) []  - 0 Hypo / Hyperglycemic Management (close monitor of Blood Glucose) []  - 0 Ankle / Brachial Index (ABI) - do not check if billed separately X- 1 5 Vital Signs Has the patient been seen at the hospital within the last three years: Yes Total Score: 80 Level Of Care: New/Established - Level 3 Electronic Signature(s) Signed: 07/26/2021 5:08:54 PM By: Rhae Hammock RN Entered By: Rhae Hammock on 07/26/2021 08:57:08 -------------------------------------------------------------------------------- Encounter Discharge Information Details Patient Name: Date of Service: [name], [name] 07/26/2021 8:30 A M Medical Record Number: [id number] Patient Account Number: [id number] Date of Birth/Sex: Treating RN: 10-07-1945 (76 y.o. [name],  [name] Primary Care Clarinda Obi: Elyn Peers Other Clinician: Referring Zyhir Cappella: Treating Phynix Horton/Extender: Jeannine Boga in Treatment: 15 Encounter Discharge Information Items Discharge Condition: Stable Ambulatory Status: Ambulatory Discharge Destination: Home Transportation: Private Auto Accompanied By: self Schedule Follow-up Appointment: Yes Clinical Summary of Care: Patient Declined Electronic Signature(s) Signed: 07/26/2021 5:08:54 PM By: Rhae Hammock RN Entered By: Rhae Hammock on 07/26/2021 08:57:56 -------------------------------------------------------------------------------- Lower Extremity Assessment Details Patient Name: Date of Service: [name] 07/26/2021 8:30 A M Medical Record Number: [id number] Patient Account Number: [id number] Date of Birth/Sex: Treating RN: Sep 03, 1945 (76 y.o. [name], [name] Primary Care Deniz Hannan: Elyn Peers Other Clinician: Referring Lyndsee Casa: Treating Magdelena Kinsella/Extender: Arnette Schaumann Weeks in Treatment: 15 Edema Assessment Assessed: [Left: Yes] Patrice Paradise: No] Edema: [Left: N] [Right: o] Calf Left: Right: Point of Measurement: 41 cm From Medial Instep 37.5 cm Ankle Left: Right: Point of Measurement: 8 cm From Medial Instep 23.5 cm Vascular Assessment Pulses: Dorsalis Pedis Palpable: [Left:Yes] Posterior Tibial Palpable: [Left:Yes] Electronic Signature(s) Signed: 07/26/2021 5:08:54 PM By: Rhae Hammock RN Entered By: Rhae Hammock on 07/26/2021 08:46:26 -------------------------------------------------------------------------------- Multi Wound Chart Details Patient Name: Date of Service: [name], [name] 07/26/2021 8:30 A M Medical Record Number: [id number] Patient Account Number: [id number] Date of Birth/Sex: Treating RN: 1946-05-13 (76 y.o. [name] Primary Care Suheyla Mortellaro: Elyn Peers Other Clinician: Referring Geana Walts: Treating  Darcell Yacoub/Extender: Arnette Schaumann Weeks in Treatment: 15 Vital Signs Height(in): 72 Pulse(bpm): 60 Weight(lbs): 195 Blood Pressure(mmHg): 144/74 Body Mass Index(BMI): 26.4 Temperature(F): 97.6 Respiratory Rate(breaths/min): 17 Photos: [N/A:N/A] Left, Anterior Lower Leg N/A N/A Wound Location: Trauma N/A N/A Wounding Event: Trauma, Other N/A N/A Primary Etiology: Hypertension N/A N/A Comorbid History: 02/08/2021 N/A N/A Date Acquired: 15 N/A N/A Weeks  of Treatment: Open N/A N/A Wound Status: No N/A N/A Wound Recurrence: Yes N/A N/A Clustered Wound: 0x0x0 N/A N/A Measurements L x W x D (cm) 0 N/A N/A A (cm) : rea 0 N/A N/A Volume (cm) : 100.00% N/A N/A % Reduction in A rea: 100.00% N/A N/A % Reduction in Volume: Full Thickness Without Exposed N/A N/A Classification: Support Structures Medium N/A N/A Exudate Amount: Serosanguineous N/A N/A Exudate Type: red, brown N/A N/A Exudate Color: Distinct, outline attached N/A N/A Wound Margin: Large (67-100%) N/A N/A Granulation Amount: Red, Pink N/A N/A Granulation Quality: None Present (0%) N/A N/A Necrotic Amount: Fat Layer (Subcutaneous Tissue): Yes N/A N/A Exposed Structures: Fascia: No Tendon: No Muscle: No Joint: No Bone: No Large (67-100%) N/A N/A Epithelialization: Treatment Notes Wound #1 (Lower Leg) Wound Laterality: Left, Anterior Cleanser Peri-Wound Care Topical Primary Dressing Secondary Dressing Secured With Compression Wrap Compression Stockings Add-Ons Electronic Signature(s) Signed: 07/26/2021 9:28:12 AM By: Kalman Shan DO Signed: 07/26/2021 6:28:13 PM By: Levan Hurst RN, BSN Entered By: Kalman Shan on 07/26/2021 09:24:49 -------------------------------------------------------------------------------- Multi-Disciplinary Care Plan Details Patient Name: Date of Service: [name], [name] 07/26/2021 8:30 A M Medical Record Number:  [id number] Patient Account Number: [id number] Date of Birth/Sex: Treating RN: February 19, 1946 (76 y.o. [name], [name] Primary Care Thailyn Khalid: Elyn Peers Other Clinician: Referring Bacilio Abascal: Treating Inara Dike/Extender: Jeannine Boga in Treatment: [place] reviewed with physician Active Inactive Electronic Signature(s) Signed: 07/26/2021 5:08:54 PM By: Rhae Hammock RN Entered By: Rhae Hammock on 07/26/2021 08:55:23 -------------------------------------------------------------------------------- Pain Assessment Details Patient Name: Date of Service: [name] 07/26/2021 8:30 A M Medical Record Number: [id number] Patient Account Number: [id number] Date of Birth/Sex: Treating RN: June 26, 1945 (76 y.o. [name], [name] Primary Care Sherrol Vicars: Elyn Peers Other Clinician: Referring Kylil Swopes: Treating Dulcey Riederer/Extender: Arnette Schaumann Weeks in Treatment: 15 Active Problems Location of Pain Severity and Description of Pain Patient Has Paino Yes Site Locations Pain Location: Pain Location: Generalized Pain, Pain in Ulcers With Dressing Change: Yes Duration of the Pain. Constant / Intermittento Intermittent Rate the pain. Current Pain Level: 3 Worst Pain Level: 10 Least Pain Level: 0 Tolerable Pain Level: 3 Character of Pain Describe the Pain: Aching Pain Management and Medication Current Pain Management: Medication: No Cold Application: No Rest: No Massage: No Activity: No T.E.N.S.: No Heat Application: No Leg drop or elevation: No Is the Current Pain Management Adequate: Adequate How does your wound impact your activities of daily livingo Sleep: No Bathing: No Appetite: No Relationship With Others: No Bladder Continence: No Emotions: No Bowel Continence: No Work: No Toileting: No Drive: No Dressing: No Hobbies: No Electronic Signature(s) Signed: 07/26/2021 5:08:54 PM By:  Rhae Hammock RN Entered By: Rhae Hammock on 07/26/2021 08:42:31 -------------------------------------------------------------------------------- Patient/Caregiver Education Details Patient Name: Date of Service: Orvan July 2/16/2023andnbsp8:30 A M Medical Record Number: [id number] Patient Account Number: [id number] Date of Birth/Gender: Treating RN: 1946/01/28 (21 y.o. [name] Primary Care Physician: Elyn Peers Other Clinician: Referring Physician: Treating Physician/Extender: Jeannine Boga in Treatment: 15 Education Assessment Education Provided To: Patient Education Topics Provided Wound/Skin Impairment: Methods: Explain/Verbal Responses: Reinforcements needed, State content correctly Electronic Signature(s) Signed: 07/26/2021 5:08:54 PM By: Rhae Hammock RN Entered By: Rhae Hammock on 07/26/2021 08:55:51 -------------------------------------------------------------------------------- Wound Assessment Details Patient Name: Date of Service: [name] 07/26/2021 8:30 A M Medical Record Number: [id number] Patient Account Number: [id number] Date of Birth/Sex:  Treating RN: January 18, 1946 (76 y.o. [name], Meta.Reding Primary Care Eric Morganti: Elyn Peers Other Clinician: Referring Yordy Matton: Treating Codie Krogh/Extender: Arnette Schaumann Weeks in Treatment: 15 Wound Status Wound Number: 1 Primary Etiology: Trauma, Other Wound Location: Left, Anterior Lower Leg Wound Status: Open Wounding Event: Trauma Comorbid History: Hypertension Date Acquired: 02/08/2021 Weeks Of Treatment: 15 Clustered Wound: Yes Photos Wound Measurements Length: (cm) Width: (cm) Depth: (cm) Area: (cm) Volume: (cm) 0 % Reduction in Area: 100% 0 % Reduction in Volume: 100% 0 Epithelialization: Large (67-100%) 0 0 Wound Description Classification: Full Thickness Without Exposed Support Structures Wound Margin:  Distinct, outline attached Exudate Amount: Medium Exudate Type: Serosanguineous Exudate Color: red, brown Foul Odor After Cleansing: No Slough/Fibrino No Wound Bed Granulation Amount: Large (67-100%) Exposed Structure Granulation Quality: Red, Pink Fascia Exposed: No Necrotic Amount: None Present (0%) Fat Layer (Subcutaneous Tissue) Exposed: Yes Tendon Exposed: No Muscle Exposed: No Joint Exposed: No Bone Exposed: No Electronic Signature(s) Signed: 07/26/2021 4:02:17 PM By: Deon Pilling RN, BSN Entered By: Deon Pilling on 07/26/2021 08:47:15 -------------------------------------------------------------------------------- Vitals Details Patient Name: Date of Service: [name], [name] 07/26/2021 8:30 A M Medical Record Number: [id number] Patient Account Number: [id number] Date of Birth/Sex: Treating RN: Nov 27, 1945 (76 y.o. [name], [name] Primary Care Reganne Messerschmidt: Elyn Peers Other Clinician: Referring Pedro Whiters: Treating Jarrius Huaracha/Extender: Arnette Schaumann Weeks in Treatment: 15 Vital Signs Time Taken: 08:41 Temperature (F): 97.6 Height (in): 72 Pulse (bpm): 60 Weight (lbs): 195 Respiratory Rate (breaths/min): 17 Body Mass Index (BMI): 26.4 Blood Pressure (mmHg): 144/74 Reference Range: 80 - 120 mg / dl Electronic Signature(s) Signed: 07/26/2021 5:08:54 PM By: Rhae Hammock RN Entered By: Rhae Hammock on 07/26/2021 08:41:30

## 2021-08-21 DIAGNOSIS — I10 Essential (primary) hypertension: Secondary | ICD-10-CM | POA: Diagnosis not present

## 2021-08-21 DIAGNOSIS — E1169 Type 2 diabetes mellitus with other specified complication: Secondary | ICD-10-CM | POA: Diagnosis not present

## 2021-08-21 DIAGNOSIS — M130 Polyarthritis, unspecified: Secondary | ICD-10-CM | POA: Diagnosis not present

## 2021-08-21 DIAGNOSIS — E782 Mixed hyperlipidemia: Secondary | ICD-10-CM | POA: Diagnosis not present

## 2021-10-08 DIAGNOSIS — Z23 Encounter for immunization: Secondary | ICD-10-CM | POA: Diagnosis not present

## 2021-10-30 DIAGNOSIS — Z79899 Other long term (current) drug therapy: Secondary | ICD-10-CM | POA: Diagnosis not present

## 2021-10-30 DIAGNOSIS — Z961 Presence of intraocular lens: Secondary | ICD-10-CM | POA: Diagnosis not present

## 2021-10-30 DIAGNOSIS — H353131 Nonexudative age-related macular degeneration, bilateral, early dry stage: Secondary | ICD-10-CM | POA: Diagnosis not present

## 2021-10-30 DIAGNOSIS — H2511 Age-related nuclear cataract, right eye: Secondary | ICD-10-CM | POA: Diagnosis not present

## 2021-10-30 DIAGNOSIS — H33312 Horseshoe tear of retina without detachment, left eye: Secondary | ICD-10-CM | POA: Diagnosis not present

## 2021-10-30 DIAGNOSIS — H35033 Hypertensive retinopathy, bilateral: Secondary | ICD-10-CM | POA: Diagnosis not present

## 2021-10-30 DIAGNOSIS — H209 Unspecified iridocyclitis: Secondary | ICD-10-CM | POA: Diagnosis not present

## 2021-11-12 DIAGNOSIS — H2511 Age-related nuclear cataract, right eye: Secondary | ICD-10-CM | POA: Diagnosis not present

## 2021-11-12 DIAGNOSIS — H05243 Constant exophthalmos, bilateral: Secondary | ICD-10-CM | POA: Diagnosis not present

## 2021-11-12 DIAGNOSIS — H04123 Dry eye syndrome of bilateral lacrimal glands: Secondary | ICD-10-CM | POA: Diagnosis not present

## 2021-11-12 DIAGNOSIS — H40013 Open angle with borderline findings, low risk, bilateral: Secondary | ICD-10-CM | POA: Diagnosis not present

## 2021-11-12 DIAGNOSIS — H5213 Myopia, bilateral: Secondary | ICD-10-CM | POA: Diagnosis not present

## 2021-11-12 DIAGNOSIS — E119 Type 2 diabetes mellitus without complications: Secondary | ICD-10-CM | POA: Diagnosis not present

## 2021-11-12 DIAGNOSIS — H10413 Chronic giant papillary conjunctivitis, bilateral: Secondary | ICD-10-CM | POA: Diagnosis not present

## 2021-11-26 DIAGNOSIS — E1169 Type 2 diabetes mellitus with other specified complication: Secondary | ICD-10-CM | POA: Diagnosis not present

## 2021-11-26 DIAGNOSIS — E782 Mixed hyperlipidemia: Secondary | ICD-10-CM | POA: Diagnosis not present

## 2021-11-26 DIAGNOSIS — I119 Hypertensive heart disease without heart failure: Secondary | ICD-10-CM | POA: Diagnosis not present

## 2021-11-26 DIAGNOSIS — T7840XS Allergy, unspecified, sequela: Secondary | ICD-10-CM | POA: Diagnosis not present

## 2021-12-05 DIAGNOSIS — R3915 Urgency of urination: Secondary | ICD-10-CM | POA: Diagnosis not present

## 2021-12-05 DIAGNOSIS — N401 Enlarged prostate with lower urinary tract symptoms: Secondary | ICD-10-CM | POA: Diagnosis not present

## 2021-12-05 DIAGNOSIS — N35012 Post-traumatic membranous urethral stricture: Secondary | ICD-10-CM | POA: Diagnosis not present

## 2021-12-05 DIAGNOSIS — Z8546 Personal history of malignant neoplasm of prostate: Secondary | ICD-10-CM | POA: Diagnosis not present

## 2021-12-10 DIAGNOSIS — J301 Allergic rhinitis due to pollen: Secondary | ICD-10-CM | POA: Diagnosis not present

## 2021-12-10 DIAGNOSIS — E1169 Type 2 diabetes mellitus with other specified complication: Secondary | ICD-10-CM | POA: Diagnosis not present

## 2021-12-31 DIAGNOSIS — D84821 Immunodeficiency due to drugs: Secondary | ICD-10-CM | POA: Diagnosis not present

## 2021-12-31 DIAGNOSIS — H3530 Unspecified macular degeneration: Secondary | ICD-10-CM | POA: Diagnosis not present

## 2021-12-31 DIAGNOSIS — H15009 Unspecified scleritis, unspecified eye: Secondary | ICD-10-CM | POA: Diagnosis not present

## 2021-12-31 DIAGNOSIS — I10 Essential (primary) hypertension: Secondary | ICD-10-CM | POA: Diagnosis not present

## 2021-12-31 DIAGNOSIS — H40059 Ocular hypertension, unspecified eye: Secondary | ICD-10-CM | POA: Diagnosis not present

## 2021-12-31 DIAGNOSIS — E1151 Type 2 diabetes mellitus with diabetic peripheral angiopathy without gangrene: Secondary | ICD-10-CM | POA: Diagnosis not present

## 2021-12-31 DIAGNOSIS — M1990 Unspecified osteoarthritis, unspecified site: Secondary | ICD-10-CM | POA: Diagnosis not present

## 2021-12-31 DIAGNOSIS — J309 Allergic rhinitis, unspecified: Secondary | ICD-10-CM | POA: Diagnosis not present

## 2021-12-31 DIAGNOSIS — E785 Hyperlipidemia, unspecified: Secondary | ICD-10-CM | POA: Diagnosis not present

## 2022-01-07 DIAGNOSIS — M1711 Unilateral primary osteoarthritis, right knee: Secondary | ICD-10-CM | POA: Diagnosis not present

## 2022-01-15 ENCOUNTER — Ambulatory Visit
Admission: EM | Admit: 2022-01-15 | Discharge: 2022-01-15 | Disposition: A | Discharge status: Home / Self Care | Payer: Medicare PPO | Attending: Family Medicine | Admitting: Family Medicine

## 2022-01-15 ENCOUNTER — Encounter: Payer: Self-pay | Admitting: Emergency Medicine

## 2022-01-15 DIAGNOSIS — L0231 Cutaneous abscess of buttock: Secondary | ICD-10-CM

## 2022-01-15 MED ORDER — DOXYCYCLINE HYCLATE 100 MG PO CAPS: 100.0000 mg | ORAL_CAPSULE | Freq: Two times a day (BID) | ORAL | 0 refills | Status: DC

## 2022-01-15 NOTE — ED Provider Notes (Signed)
Larry Welch    CSN: [id number] Arrival date & time: 01/15/22  3875      History   Chief Complaint Chief Complaint  Patient presents with  . Abscess    HPI Larry Welch is a 76 y.o. male.   HPI Patient   Past Medical History:  Diagnosis Date  . Cancer [facility])    prostate  . Diabetes mellitus   . High cholesterol   . Hypertension     Patient Active Problem List   Diagnosis Date Noted  . OSA (obstructive sleep apnea) 09/11/2012    Past Surgical History:  Procedure Laterality Date  . BACK SURGERY  2009  . FOOT SURGERY  19080s   Ganglion cyst inner right foot.  . INSERTION PROSTATE RADIATION SEED         Home Medications    Prior to Admission medications   Medication Sig Start Date End Date Taking? Authorizing Provider  doxycycline (VIBRAMYCIN) 100 MG capsule Take 1 capsule (100 mg total) by mouth 2 (two) times daily. 01/15/22  Yes Scot Jun, FNP  aliskiren (TEKTURNA) 300 MG tablet Take 300 mg by mouth daily.    [provider]  amLODipine-olmesartan (AZOR) 10-40 MG per tablet Take 1 tablet by mouth at bedtime.    [provider]  aspirin 325 MG tablet Take 325 mg by mouth at bedtime.    [provider]  Choline Fenofibrate (FENOFIBRIC ACID) 135 MG CPDR Take 135 mg by mouth at bedtime.     [provider]  metFORMIN (GLUCOPHAGE) 500 MG tablet Take 500 mg by mouth at bedtime.    [provider]  rosuvastatin (CRESTOR) 10 MG tablet Take 10 mg by mouth at bedtime.    [provider]  silodosin (RAPAFLO) 8 MG CAPS capsule Take 8 mg by mouth at bedtime.    [provider]  sitaGLIPtin (JANUVIA) 100 MG tablet Take 100 mg by mouth at bedtime.    [provider]    Family History Family History  Problem Relation Age of Onset  . Cancer Father     Social History Social History   Tobacco Use  . Smoking status: Former    Packs/day: 0.75    Years: 12.00    Total pack years:  9.00    Types: Cigarettes    Quit date: 06/10/1989    Years since quitting: 32.6  . Smokeless tobacco: Never  Substance Use Topics  . Alcohol use: Yes  . Drug use: No     Allergies   Patient has no known allergies.   Review of Systems Review of Systems   Physical Exam Triage Vital Signs ED Triage Vitals  Enc Vitals Group     BP 01/15/22 0949 128/65     Pulse Rate 01/15/22 0949 67     Resp 01/15/22 0949 18     Temp 01/15/22 0949 98.2 F (36.8 C)     Temp src --      SpO2 01/15/22 0949 97 %     Weight --      Height --      Head Circumference --      Peak Flow --      Pain Score 01/15/22 0954 8     Pain Loc --      Pain Edu? --      Excl. in [place]? --    No data found.  Updated Vital Signs BP 128/65   Pulse 67   Temp  98.2 F (36.8 C)   Resp 18   SpO2 97%   Visual Acuity Right Eye Distance:   Left Eye Distance:   Bilateral Distance:    Right Eye Near:   Left Eye Near:    Bilateral Near:     Physical Exam   UC Treatments / Results  Labs (all labs ordered are listed, but only abnormal results are displayed) Labs Reviewed - No data to display  EKG   Radiology No results found.  Procedures Incision and Drainage  Date/Time: 01/15/2022 10:10 AM  Performed by: Scot Jun, FNP Authorized by: Scot Jun, FNP   Consent:    Consent obtained:  Verbal   Risks discussed:  Incomplete drainage   Alternatives discussed:  No treatment Universal protocol:    Patient identity confirmed:  Verbally with patient Location:    Type:  Abscess   Size:  9 mm   Location:  Anogenital (Left buttocks) Pre-procedure details:    Skin preparation:  Povidone-iodine Sedation:    Sedation type:  None Anesthesia:    Anesthesia method:  Local infiltration   Local anesthetic:  Lidocaine 1% WITH epi Procedure type:    Complexity:  Complex Procedure details:    Incision types:  Stab incision   Incision depth:  Subcutaneous   Wound management:  Probed  and deloculated   Drainage:  Purulent   Drainage amount:  Copious   Packing materials:  1/4 in iodoform gauze Post-procedure details:    Procedure completion:  Tolerated well, no immediate complications  (including critical care time)  Medications Ordered in UC Medications - No data to display  Initial Impression / Assessment and Plan / UC Course  I have reviewed the triage vital signs and the nursing notes.  Pertinent labs & imaging results that were available during my care of the patient were reviewed by me and considered in my medical decision making (see chart for details).     *** Final Clinical Impressions(s) / UC Diagnoses   Final diagnoses:  Left buttock abscess   Discharge Instructions   None    ED Prescriptions     Medication Sig Dispense Auth. Provider   doxycycline (VIBRAMYCIN) 100 MG capsule Take 1 capsule (100 mg total) by mouth 2 (two) times daily. 20 capsule Scot Jun, FNP      PDMP not reviewed this encounter.

## 2022-01-15 NOTE — ED Triage Notes (Signed)
Pt here with abscess to left buttock x 2 weeks.

## 2022-01-17 ENCOUNTER — Ambulatory Visit
Admission: EM | Admit: 2022-01-17 | Discharge: 2022-01-17 | Disposition: A | Discharge status: Home / Self Care | Payer: Medicare PPO

## 2022-01-17 DIAGNOSIS — L0231 Cutaneous abscess of buttock: Secondary | ICD-10-CM | POA: Diagnosis not present

## 2022-01-17 NOTE — Discharge Instructions (Signed)
Abscess appears to be progressing appropriately, packing was removed today in office  Continue to take antibiotics as directed  Hold warm compresses or complete warm soaks over the affected area at as much as possible to help soften tissue and for facilitate drainage  You may follow-up with urgent care as needed if any concerns regarding healing

## 2022-01-17 NOTE — ED Provider Notes (Signed)
[facility]    CSN: [id number] Arrival date & time: 01/17/22  1126      History   Chief Complaint Chief Complaint  Patient presents with   Wound Check    HPI Larry Welch is a 76 y.o. male.   Patient presents for reevaluation of abscess to the buttocks that was draine and packed in office 2 days ago.  Currently taking doxycycline for treatment.  Drainage has slowed today, has been keeping covered with a nonadherent dressing.  Denies fever or chills.  Past Medical History:  Diagnosis Date   Cancer [facility])    prostate   Diabetes mellitus    High cholesterol    Hypertension     Patient Active Problem List   Diagnosis Date Noted   OSA (obstructive sleep apnea) 09/11/2012    Past Surgical History:  Procedure Laterality Date   BACK SURGERY  2009   FOOT SURGERY  19080s   Ganglion cyst inner right foot.   INSERTION PROSTATE RADIATION SEED         Home Medications    Prior to Admission medications   Medication Sig Start Date End Date Taking? Authorizing Provider  aliskiren (TEKTURNA) 300 MG tablet Take 300 mg by mouth daily.    [provider]  amLODipine-olmesartan (AZOR) 10-40 MG per tablet Take 1 tablet by mouth at bedtime.    [provider]  aspirin 325 MG tablet Take 325 mg by mouth at bedtime.    [provider]  Choline Fenofibrate (FENOFIBRIC ACID) 135 MG CPDR Take 135 mg by mouth at bedtime.     [provider]  doxycycline (VIBRAMYCIN) 100 MG capsule Take 1 capsule (100 mg total) by mouth 2 (two) times daily. 01/15/22   Scot Jun, FNP  metFORMIN (GLUCOPHAGE) 500 MG tablet Take 500 mg by mouth at bedtime.    [provider]  rosuvastatin (CRESTOR) 10 MG tablet Take 10 mg by mouth at bedtime.    [provider]  silodosin (RAPAFLO) 8 MG CAPS capsule Take 8 mg by mouth at bedtime.    [provider]  sitaGLIPtin (JANUVIA) 100 MG tablet Take 100 mg by mouth at bedtime.    [provider]    Family History Family History  Problem Relation Age of Onset   Cancer Father     Social History Social History   Tobacco Use   Smoking status: Former    Packs/day: 0.75    Years: 12.00    Total pack years: 9.00    Types: Cigarettes    Quit date: 06/10/1989    Years since quitting: 32.6   Smokeless tobacco: Never  Substance Use Topics   Alcohol use: Yes   Drug use: No     Allergies   Patient has no known allergies.   Review of Systems Review of Systems   Physical Exam Triage Vital Signs ED Triage Vitals [01/17/22 1158]  Enc Vitals Group     BP 120/76     Pulse Rate (!) 56     Resp 18     Temp 98.7 F (37.1 C)     Temp Source Oral     SpO2 97 %     Weight      Height      Head Circumference      Peak Flow      Pain Score      Pain Loc      Pain Edu?  Excl. in [place]?    No data found.  Updated Vital Signs BP 120/76 (BP Location: Right Arm)   Pulse (!) 56   Temp 98.7 F (37.1 C) (Oral)   Resp 18   SpO2 97%   Visual Acuity Right Eye Distance:   Left Eye Distance:   Bilateral Distance:    Right Eye Near:   Left Eye Near:    Bilateral Near:     Physical Exam Constitutional:      Appearance: Normal appearance.  Eyes:     Extraocular Movements: Extraocular movements intact.  Pulmonary:     Effort: Pulmonary effort is normal.  Skin:    Comments: 9 mm abscess present to the left buttocks, approximately 2 x 2 cm opening to the center, packing present with small amount of yellow purulent drainage  Neurological:     Mental Status: He is alert and oriented to person, place, and time. Mental status is at baseline.  Psychiatric:        Mood and Affect: Mood normal.        Behavior: Behavior normal.      UC Treatments / Results  Labs (all labs ordered are listed, but only abnormal results are displayed) Labs Reviewed - No data to display  EKG   Radiology No results found.  Procedures Procedures (including critical  care time)  Medications Ordered in UC Medications - No data to display  Initial Impression / Assessment and Plan / UC Course  I have reviewed the triage vital signs and the nursing notes.  Pertinent labs & imaging results that were available during my care of the patient were reviewed by me and considered in my medical decision making (see chart for details).  Left buttocks abscess  Site appears to be healing appropriately, packing removed today in office, recommended continued use of antibiotic as prescribed as well as warm compresses or soaks over the affected area to help facilitate drainage, may follow-up with urgent care as needed for 9 healing site Final Clinical Impressions(s) / UC Diagnoses   Final diagnoses:  None   Discharge Instructions   None    ED Prescriptions   None    PDMP not reviewed this encounter.   Hans Eden, NP 01/17/22 1213

## 2022-01-17 NOTE — ED Triage Notes (Signed)
Patient presents to Urgent Care for follow-up appt for wound check. Was seen x 2 days ago for abscess and prescribed antibiotics.

## 2022-02-19 DIAGNOSIS — Z961 Presence of intraocular lens: Secondary | ICD-10-CM | POA: Diagnosis not present

## 2022-02-19 DIAGNOSIS — H33312 Horseshoe tear of retina without detachment, left eye: Secondary | ICD-10-CM | POA: Diagnosis not present

## 2022-02-19 DIAGNOSIS — H35033 Hypertensive retinopathy, bilateral: Secondary | ICD-10-CM | POA: Diagnosis not present

## 2022-02-19 DIAGNOSIS — H353131 Nonexudative age-related macular degeneration, bilateral, early dry stage: Secondary | ICD-10-CM | POA: Diagnosis not present

## 2022-02-19 DIAGNOSIS — Z79899 Other long term (current) drug therapy: Secondary | ICD-10-CM | POA: Diagnosis not present

## 2022-02-19 DIAGNOSIS — H2511 Age-related nuclear cataract, right eye: Secondary | ICD-10-CM | POA: Diagnosis not present

## 2022-02-19 DIAGNOSIS — H209 Unspecified iridocyclitis: Secondary | ICD-10-CM | POA: Diagnosis not present

## 2022-02-21 DIAGNOSIS — E1169 Type 2 diabetes mellitus with other specified complication: Secondary | ICD-10-CM | POA: Diagnosis not present

## 2022-03-08 DIAGNOSIS — I10 Essential (primary) hypertension: Secondary | ICD-10-CM | POA: Diagnosis not present

## 2022-03-08 DIAGNOSIS — E782 Mixed hyperlipidemia: Secondary | ICD-10-CM | POA: Diagnosis not present

## 2022-03-08 DIAGNOSIS — E1169 Type 2 diabetes mellitus with other specified complication: Secondary | ICD-10-CM | POA: Diagnosis not present

## 2022-03-22 ENCOUNTER — Ambulatory Visit: Payer: Medicare PPO | Admitting: Internal Medicine

## 2022-03-22 ENCOUNTER — Encounter: Payer: Self-pay | Admitting: Internal Medicine

## 2022-03-22 VITALS — BP 122/78 | HR 64 | Temp 98.0°F | Resp 16 | Ht 70.0 in | Wt 187.8 lb

## 2022-03-22 DIAGNOSIS — Z01818 Encounter for other preprocedural examination: Secondary | ICD-10-CM | POA: Insufficient documentation

## 2022-03-22 DIAGNOSIS — E782 Mixed hyperlipidemia: Secondary | ICD-10-CM | POA: Diagnosis not present

## 2022-03-22 DIAGNOSIS — I10 Essential (primary) hypertension: Secondary | ICD-10-CM | POA: Diagnosis not present

## 2022-03-22 DIAGNOSIS — E119 Type 2 diabetes mellitus without complications: Secondary | ICD-10-CM | POA: Diagnosis not present

## 2022-03-22 NOTE — Progress Notes (Signed)
Primary Physician/Referring:  Lucianne Lei, MD  Patient ID: Larry Welch, male    DOB: 08-23-1945, 76 y.o.   MRN: [id number]  Chief Complaint  Patient presents with   Pre-op Exam   New Patient (Initial Visit)   HPI:    Larry Welch  is a 76 y.o. male with past medical history significant for hypertension, hyperlipidemia, and diabetes who is here to establish care with cardiology for surgical risk stratification prior to knee replacement.  Patient denies history of heart disease or arrhythmia ever in the past.  He is compliant with his medications.  Patient was never a smoker and he does not drink alcohol.  He golfs 18 holes however he uses the golf cart rather than walking he is not sure if this is just limited due to his knee pain.  It has been a couple of years since he has had a stress test or an echo in the past.  He denies chest pain, shortness of breath, palpitations, diaphoresis, syncope, edema, orthopnea, PND.  Past Medical History:  Diagnosis Date   Cancer [facility])    prostate   Diabetes mellitus    High cholesterol    Hypertension    Past Surgical History:  Procedure Laterality Date   BACK SURGERY  2009   FOOT SURGERY  [phone]   Ganglion cyst inner right foot.   INSERTION PROSTATE RADIATION SEED     Family History  Problem Relation Age of Onset   Cancer Father        Prostate and Bladder    Social History   Tobacco Use   Smoking status: Former    Packs/day: 0.75    Years: 12.00    Total pack years: 9.00    Types: Cigarettes    Quit date: 06/10/1989    Years since quitting: 32.8   Smokeless tobacco: Never  Substance Use Topics   Alcohol use: Yes   Marital Status: Married  ROS  Review of Systems  Cardiovascular:  Negative for chest pain, claudication, cyanosis, dyspnea on exertion, irregular heartbeat, leg swelling, near-syncope, orthopnea, palpitations, paroxysmal nocturnal dyspnea and syncope.  Objective  Blood pressure 122/78, pulse 64, temperature 98 F  (36.7 C), temperature source Temporal, resp. rate 16, height '5\' 10"'$  (1.778 m), weight 187 lb 12.8 oz (85.2 kg), SpO2 99 %. Body mass index is 26.95 kg/m.     03/22/2022   10:43 AM 01/17/2022   11:58 AM 01/15/2022    9:49 AM  Vitals with BMI  Height '5\' 10"'$     Weight 187 lbs 13 oz    BMI 02.40    Systolic 973 532 992  Diastolic 78 76 65  Pulse 64 56 67     Physical Exam Vitals and nursing note reviewed.  Constitutional:      General: He is not in acute distress. HENT:     Head: Normocephalic and atraumatic.  Cardiovascular:     Rate and Rhythm: Normal rate and regular rhythm.     Pulses: Normal pulses.     Heart sounds: Normal heart sounds. No murmur heard.    No gallop.  Pulmonary:     Effort: Pulmonary effort is normal.     Breath sounds: Normal breath sounds.  Abdominal:     General: Bowel sounds are normal.  Musculoskeletal:     Right lower leg: No edema.     Left lower leg: No edema.  Skin:    General: Skin is warm and dry.  Neurological:  Mental Status: He is alert.   Medications and allergies  No Known Allergies   Medication list after today's encounter   Current Outpatient Medications:    amLODipine-olmesartan (AZOR) 10-40 MG per tablet, Take 1 tablet by mouth at bedtime., Disp: , Rfl:    Choline Fenofibrate (FENOFIBRIC ACID) 135 MG CPDR, Take 135 mg by mouth at bedtime. , Disp: , Rfl:    doxycycline (VIBRAMYCIN) 100 MG capsule, Take 1 capsule (100 mg total) by mouth 2 (two) times daily., Disp: 20 capsule, Rfl: 0   metFORMIN (GLUCOPHAGE) 500 MG tablet, Take 500 mg by mouth at bedtime., Disp: , Rfl:    rosuvastatin (CRESTOR) 20 MG tablet, Take 10 mg by mouth at bedtime., Disp: , Rfl:    sitaGLIPtin (JANUVIA) 100 MG tablet, Take 100 mg by mouth at bedtime., Disp: , Rfl:   Laboratory examination:   Lab Results  Component Value Date   NA 137 04/04/2021   K 4.2 04/04/2021   CO2 26 04/04/2021   GLUCOSE 85 04/04/2021   BUN 15 04/04/2021   CREATININE 1.24  04/04/2021   CALCIUM 9.5 04/04/2021   GFRNONAA >60 04/04/2021       Latest Ref Rng & Units 04/04/2021    8:57 PM 11/19/2015    6:45 PM 10/18/2013   12:04 AM  CMP  Glucose 70 - 99 mg/dL 85  106  107   BUN 8 - 23 mg/dL '15  16  12   '$ Creatinine 0.61 - 1.24 mg/dL 1.24  1.17  1.10   Sodium 135 - 145 mmol/L 137  141  143   Potassium 3.5 - 5.1 mmol/L 4.2  3.6  3.9   Chloride 98 - 111 mmol/L 105  110  106   CO2 22 - 32 mmol/L 26  22    Calcium 8.9 - 10.3 mg/dL 9.5  9.5    Total Protein 6.5 - 8.1 g/dL 7.3     Total Bilirubin 0.3 - 1.2 mg/dL 0.4     Alkaline Phos 38 - 126 U/L 33     AST 15 - 41 U/L 23     ALT 0 - 44 U/L 17         Latest Ref Rng & Units 04/04/2021    8:57 PM 11/19/2015    6:45 PM 10/18/2013   12:04 AM  CBC  WBC 4.0 - 10.5 K/uL 7.8  7.2    Hemoglobin 13.0 - 17.0 g/dL 12.1  12.7  14.6   Hematocrit 39.0 - 52.0 % 37.6  37.8  43.0   Platelets 150 - 400 K/uL 270  273      Lipid Panel No results for input(s): "CHOL", "TRIG", "[place]", "VLDL", "HDL", "CHOLHDL", "LDLDIRECT" in the last 8760 hours.  HEMOGLOBIN A1C No results found for: "HGBA1C", "MPG" TSH No results for input(s): "TSH" in the last 8760 hours.  External labs:   03/08/2022 total cholesterol 118, HDL 41, triglycerides 88, LDL 60 Glucose 78, BUN 16, creatinine 1.28, potassium 4.8 Hemoglobin A1c 5.6  Radiology:    Cardiac Studies:        EKG:   03/22/2022: NSR, normal axis, normal R wave progression, non-specific ST changes  Assessment     ICD-10-CM   1. Pre-operative clearance  Z01.818 EKG 12-Lead    PCV MYOCARDIAL PERFUSION WO LEXISCAN    2. Primary hypertension  I10 PCV MYOCARDIAL PERFUSION WO LEXISCAN    3. Mixed hyperlipidemia  E78.2 PCV MYOCARDIAL PERFUSION WO LEXISCAN    4. Type 2  diabetes mellitus without complication, without long-term current use of insulin ([place])  E11.9 PCV MYOCARDIAL PERFUSION WO LEXISCAN       Orders Placed This Encounter  Procedures   PCV MYOCARDIAL  PERFUSION WO LEXISCAN    Standing Status:   Future    Standing Expiration Date:   05/22/2022   EKG 12-Lead    No orders of the defined types were placed in this encounter.   Medications Discontinued During This Encounter  Medication Reason   aliskiren (TEKTURNA) 300 MG tablet    aspirin 325 MG tablet    silodosin (RAPAFLO) 8 MG CAPS capsule      Recommendations:   HABIB KISE is a 76 y.o.  M here for pre-op clearance  Pre-operative clearance Schedule imaging tests in office - stress test. Will clear patient for surgery once stress test complete   Primary hypertension Continue current cardiac medications. Encourage low-sodium diet, less than 2000 mg daily.   Mixed hyperlipidemia Continue Crestor   Type 2 diabetes mellitus without complication, without long-term current use of insulin ([place]) Continue Januvia, metformin hgbA1c well controlled  Follow-up annually or sooner if needed      Floydene Flock, DO, [facility]  03/22/2022, 11:09 AM Office: [phone] Pager: [phone]

## 2022-04-01 ENCOUNTER — Ambulatory Visit: Payer: Medicare PPO

## 2022-04-01 DIAGNOSIS — I10 Essential (primary) hypertension: Secondary | ICD-10-CM

## 2022-04-01 DIAGNOSIS — Z01810 Encounter for preprocedural cardiovascular examination: Secondary | ICD-10-CM | POA: Diagnosis not present

## 2022-04-01 DIAGNOSIS — E782 Mixed hyperlipidemia: Secondary | ICD-10-CM

## 2022-04-01 DIAGNOSIS — Z01818 Encounter for other preprocedural examination: Secondary | ICD-10-CM

## 2022-04-01 DIAGNOSIS — E119 Type 2 diabetes mellitus without complications: Secondary | ICD-10-CM

## 2022-04-01 DIAGNOSIS — R9431 Abnormal electrocardiogram [ECG] [EKG]: Secondary | ICD-10-CM | POA: Diagnosis not present

## 2022-04-05 DIAGNOSIS — H6020 Malignant otitis externa, unspecified ear: Secondary | ICD-10-CM | POA: Diagnosis not present

## 2022-04-22 ENCOUNTER — Ambulatory Visit: Payer: Self-pay | Admitting: Physician Assistant

## 2022-04-22 DIAGNOSIS — M1711 Unilateral primary osteoarthritis, right knee: Secondary | ICD-10-CM | POA: Diagnosis not present

## 2022-04-22 DIAGNOSIS — G8929 Other chronic pain: Secondary | ICD-10-CM

## 2022-04-22 NOTE — H&P (View-Only) (Signed)
TOTAL KNEE ADMISSION H&P  Patient is being admitted for right total knee arthroplasty.  Subjective:  Chief Complaint:right knee pain.  HPI: Larry Welch, 76 y.o. male, has a history of pain and functional disability in the right knee due to arthritis and has failed non-surgical conservative treatments for greater than 12 weeks to includeNSAID's and/or analgesics, corticosteriod injections, viscosupplementation injections, flexibility and strengthening excercises, use of assistive devices, and activity modification.  Onset of symptoms was gradual, starting >10 years ago with gradually worsening course since that time. The patient noted no past surgery on the right knee(s).  Patient currently rates pain in the right knee(s) at 6 out of 10 with activity. Patient has night pain, worsening of pain with activity and weight bearing, pain that interferes with activities of daily living, pain with passive range of motion, crepitus, and joint swelling.  Patient has evidence of periarticular osteophytes and joint space narrowing by imaging studies.  There is no active infection.  Patient Active Problem List   Diagnosis Date Noted   Type 2 diabetes mellitus without complication, without long-term current use of insulin ([place]) 03/22/2022   Mixed hyperlipidemia 03/22/2022   Primary hypertension 03/22/2022   Pre-operative clearance 03/22/2022   OSA (obstructive sleep apnea) 09/11/2012   Past Medical History:  Diagnosis Date   Cancer [facility])    prostate   Diabetes mellitus    High cholesterol    Hypertension     Past Surgical History:  Procedure Laterality Date   BACK SURGERY  2009   FOOT SURGERY  19080s   Ganglion cyst inner right foot.   INSERTION PROSTATE RADIATION SEED      Current Outpatient Medications  Medication Sig Dispense Refill Last Dose   amLODipine-olmesartan (AZOR) 10-40 MG per tablet Take 1 tablet by mouth at bedtime.      Choline Fenofibrate (FENOFIBRIC ACID) 135 MG CPDR Take 135  mg by mouth at bedtime.       doxycycline (VIBRAMYCIN) 100 MG capsule Take 1 capsule (100 mg total) by mouth 2 (two) times daily. 20 capsule 0    metFORMIN (GLUCOPHAGE) 500 MG tablet Take 500 mg by mouth at bedtime.      rosuvastatin (CRESTOR) 20 MG tablet Take 10 mg by mouth at bedtime.      sitaGLIPtin (JANUVIA) 100 MG tablet Take 100 mg by mouth at bedtime.      No current facility-administered medications for this visit.   No Known Allergies  Social History   Tobacco Use   Smoking status: Former    Packs/day: 0.75    Years: 12.00    Total pack years: 9.00    Types: Cigarettes    Quit date: 06/10/1989    Years since quitting: 32.8   Smokeless tobacco: Never  Substance Use Topics   Alcohol use: Yes    Family History  Problem Relation Age of Onset   Cancer Father        Prostate and Bladder     Review of Systems  Musculoskeletal:  Positive for arthralgias.  All other systems reviewed and are negative.   Objective:  Physical Exam Constitutional:      General: He is not in acute distress.    Appearance: Normal appearance.  HENT:     Head: Normocephalic and atraumatic.     Nose: Nose normal.  Eyes:     Extraocular Movements: Extraocular movements intact.     Pupils: Pupils are equal, round, and reactive to light.  Cardiovascular:  Rate and Rhythm: Normal rate and regular rhythm.     Pulses: Normal pulses.     Heart sounds: Normal heart sounds.  Pulmonary:     Effort: Pulmonary effort is normal. No respiratory distress.     Breath sounds: Normal breath sounds. No wheezing.  Abdominal:     General: Abdomen is flat. Bowel sounds are normal. There is no distension.     Palpations: Abdomen is soft.     Tenderness: There is no abdominal tenderness.  Musculoskeletal:     Cervical back: Normal range of motion and neck supple.     Right knee: Swelling, bony tenderness and crepitus present. No effusion or erythema. Normal range of motion. Tenderness present.   Lymphadenopathy:     Cervical: No cervical adenopathy.  Skin:    General: Skin is warm and dry.     Findings: No erythema or rash.  Neurological:     General: No focal deficit present.     Mental Status: He is alert and oriented to person, place, and time.  Psychiatric:        Mood and Affect: Mood normal.        Behavior: Behavior normal.     Vital signs in last 24 hours: '@VSRANGES'$ @  Labs:   Estimated body mass index is 26.95 kg/m as calculated from the following:   Height as of 03/22/22: '5\' 10"'$  (1.778 m).   Weight as of 03/22/22: 85.2 kg.   Imaging Review Plain radiographs demonstrate moderate degenerative joint disease of the right knee(s). The overall alignment ismild valgus. The bone quality appears to be good for age and reported activity level.      Assessment/Plan:  End stage arthritis, right knee   The patient history, physical examination, clinical judgment of the provider and imaging studies are consistent with end stage degenerative joint disease of the right knee(s) and total knee arthroplasty is deemed medically necessary. The treatment options including medical management, injection therapy arthroscopy and arthroplasty were discussed at length. The risks and benefits of total knee arthroplasty were presented and reviewed. The risks due to aseptic loosening, infection, stiffness, patella tracking problems, thromboembolic complications and other imponderables were discussed. The patient acknowledged the explanation, agreed to proceed with the plan and consent was signed. Patient is being admitted for inpatient treatment for surgery, pain control, PT, OT, prophylactic antibiotics, VTE prophylaxis, progressive ambulation and ADL's and discharge planning. The patient is planning to be discharged  home with outpt PT.     Patient's anticipated LOS is less than 2 midnights, meeting these requirements: - Younger than 31 - Lives within 1 hour of care - Has a  competent adult at home to recover with post-op recover - NO history of  - Chronic pain requiring opiods  - Diabetes  - Coronary Artery Disease  - Heart failure  - Heart attack  - Stroke  - DVT/VTE  - Cardiac arrhythmia  - Respiratory Failure/COPD  - Renal failure  - Anemia  - Advanced Liver disease

## 2022-04-22 NOTE — H&P (Signed)
TOTAL KNEE ADMISSION H&P  Patient is being admitted for right total knee arthroplasty.  Subjective:  Chief Complaint:right knee pain.  HPI: Larry Welch, 76 y.o. male, has a history of pain and functional disability in the right knee due to arthritis and has failed non-surgical conservative treatments for greater than 12 weeks to includeNSAID's and/or analgesics, corticosteriod injections, viscosupplementation injections, flexibility and strengthening excercises, use of assistive devices, and activity modification.  Onset of symptoms was gradual, starting >10 years ago with gradually worsening course since that time. The patient noted no past surgery on the right knee(s).  Patient currently rates pain in the right knee(s) at 6 out of 10 with activity. Patient has night pain, worsening of pain with activity and weight bearing, pain that interferes with activities of daily living, pain with passive range of motion, crepitus, and joint swelling.  Patient has evidence of periarticular osteophytes and joint space narrowing by imaging studies.  There is no active infection.  Patient Active Problem List   Diagnosis Date Noted   Type 2 diabetes mellitus without complication, without long-term current use of insulin ([place]) 03/22/2022   Mixed hyperlipidemia 03/22/2022   Primary hypertension 03/22/2022   Pre-operative clearance 03/22/2022   OSA (obstructive sleep apnea) 09/11/2012   Past Medical History:  Diagnosis Date   Cancer [facility])    prostate   Diabetes mellitus    High cholesterol    Hypertension     Past Surgical History:  Procedure Laterality Date   BACK SURGERY  2009   FOOT SURGERY  19080s   Ganglion cyst inner right foot.   INSERTION PROSTATE RADIATION SEED      Current Outpatient Medications  Medication Sig Dispense Refill Last Dose   amLODipine-olmesartan (AZOR) 10-40 MG per tablet Take 1 tablet by mouth at bedtime.      Choline Fenofibrate (FENOFIBRIC ACID) 135 MG CPDR Take 135  mg by mouth at bedtime.       doxycycline (VIBRAMYCIN) 100 MG capsule Take 1 capsule (100 mg total) by mouth 2 (two) times daily. 20 capsule 0    metFORMIN (GLUCOPHAGE) 500 MG tablet Take 500 mg by mouth at bedtime.      rosuvastatin (CRESTOR) 20 MG tablet Take 10 mg by mouth at bedtime.      sitaGLIPtin (JANUVIA) 100 MG tablet Take 100 mg by mouth at bedtime.      No current facility-administered medications for this visit.   No Known Allergies  Social History   Tobacco Use   Smoking status: Former    Packs/day: 0.75    Years: 12.00    Total pack years: 9.00    Types: Cigarettes    Quit date: 06/10/1989    Years since quitting: 32.8   Smokeless tobacco: Never  Substance Use Topics   Alcohol use: Yes    Family History  Problem Relation Age of Onset   Cancer Father        Prostate and Bladder     Review of Systems  Musculoskeletal:  Positive for arthralgias.  All other systems reviewed and are negative.   Objective:  Physical Exam Constitutional:      General: He is not in acute distress.    Appearance: Normal appearance.  HENT:     Head: Normocephalic and atraumatic.     Nose: Nose normal.  Eyes:     Extraocular Movements: Extraocular movements intact.     Pupils: Pupils are equal, round, and reactive to light.  Cardiovascular:  Rate and Rhythm: Normal rate and regular rhythm.     Pulses: Normal pulses.     Heart sounds: Normal heart sounds.  Pulmonary:     Effort: Pulmonary effort is normal. No respiratory distress.     Breath sounds: Normal breath sounds. No wheezing.  Abdominal:     General: Abdomen is flat. Bowel sounds are normal. There is no distension.     Palpations: Abdomen is soft.     Tenderness: There is no abdominal tenderness.  Musculoskeletal:     Cervical back: Normal range of motion and neck supple.     Right knee: Swelling, bony tenderness and crepitus present. No effusion or erythema. Normal range of motion. Tenderness present.   Lymphadenopathy:     Cervical: No cervical adenopathy.  Skin:    General: Skin is warm and dry.     Findings: No erythema or rash.  Neurological:     General: No focal deficit present.     Mental Status: He is alert and oriented to person, place, and time.  Psychiatric:        Mood and Affect: Mood normal.        Behavior: Behavior normal.     Vital signs in last 24 hours: '@VSRANGES'$ @  Labs:   Estimated body mass index is 26.95 kg/m as calculated from the following:   Height as of 03/22/22: '5\' 10"'$  (1.778 m).   Weight as of 03/22/22: 85.2 kg.   Imaging Review Plain radiographs demonstrate moderate degenerative joint disease of the right knee(s). The overall alignment ismild valgus. The bone quality appears to be good for age and reported activity level.      Assessment/Plan:  End stage arthritis, right knee   The patient history, physical examination, clinical judgment of the provider and imaging studies are consistent with end stage degenerative joint disease of the right knee(s) and total knee arthroplasty is deemed medically necessary. The treatment options including medical management, injection therapy arthroscopy and arthroplasty were discussed at length. The risks and benefits of total knee arthroplasty were presented and reviewed. The risks due to aseptic loosening, infection, stiffness, patella tracking problems, thromboembolic complications and other imponderables were discussed. The patient acknowledged the explanation, agreed to proceed with the plan and consent was signed. Patient is being admitted for inpatient treatment for surgery, pain control, PT, OT, prophylactic antibiotics, VTE prophylaxis, progressive ambulation and ADL's and discharge planning. The patient is planning to be discharged  home with outpt PT.     Patient's anticipated LOS is less than 2 midnights, meeting these requirements: - Younger than 68 - Lives within 1 hour of care - Has a  competent adult at home to recover with post-op recover - NO history of  - Chronic pain requiring opiods  - Diabetes  - Coronary Artery Disease  - Heart failure  - Heart attack  - Stroke  - DVT/VTE  - Cardiac arrhythmia  - Respiratory Failure/COPD  - Renal failure  - Anemia  - Advanced Liver disease

## 2022-04-24 NOTE — Care Plan (Signed)
Ortho Bundle Case Management Note  Patient Details  Name: Larry Welch MRN: [id number] Date of Birth: 04-18-1946      patient met PA in the office for H&P. CM spoke with him on the phone today. He will discharge to home with family to assist. rolling walker and CPM order OPPT set up with [name]. discharge instructions discussed and mailed to patient. questions answered. Choice offered                DME Arranged:  Walker rolling, CPM DME Agency:  [employer]  HH Arranged:    [place] Agency:     Additional Comments: Please contact me with any questions of if this plan should need to change.  Ladell Heads,  [facility]  [phone] 04/24/2022, 2:22 PM

## 2022-04-29 NOTE — Progress Notes (Signed)
COVID Vaccine Completed: yes  Date of COVID positive in last 90 days:  PCP - Lucianne Lei, MD Cardiologist - Floydene Flock, DO  Chest x-ray -  EKG - 03/22/22 Epic Stress Test - 04/01/22 Epic ECHO -  Cardiac Cath -  Pacemaker/ICD device last checked: Spinal Cord Stimulator:  Bowel Prep -   Sleep Study -  CPAP -   Fasting Blood Sugar -  Checks Blood Sugar _____ times a day  Last dose of GLP1 agonist-  N/A GLP1 instructions:  N/A   Last dose of SGLT-2 inhibitors-  N/A SGLT-2 instructions: N/A   Blood Thinner Instructions: Aspirin Instructions: Last Dose:  Activity level:  Can go up a flight of stairs and perform activities of daily living without stopping and without symptoms of chest pain or shortness of breath.  Able to exercise without symptoms  Unable to go up a flight of stairs without symptoms of     Anesthesia review: need cardiac clearance, HTN, DM, OSA, none healing wound  Patient denies shortness of breath, fever, cough and chest pain at PAT appointment  Patient verbalized understanding of instructions that were given to them at the PAT appointment. Patient was also instructed that they will need to review over the PAT instructions again at home before surgery.

## 2022-04-29 NOTE — Patient Instructions (Signed)
SURGICAL WAITING ROOM VISITATION Patients having surgery or a procedure may have no more than 2 support people in the waiting area - these visitors may rotate.   Children under the age of 77 must have an adult with them who is not the patient. If the patient needs to stay at the hospital during part of their recovery, the visitor guidelines for inpatient rooms apply. Pre-op nurse will coordinate an appropriate time for 1 support person to accompany patient in pre-op.  This support person may not rotate.    Please refer to the [facility] website for the visitor guidelines for Inpatients (after your surgery is over and you are in a regular room).     Your procedure is scheduled on: 05/10/22   Report to [facility] Main Entrance    Report to admitting at 5:15 AM   Call this number if you have problems the morning of surgery [phone]   Do not eat food :After Midnight.   After Midnight you may have the following liquids until 4:30 AM DAY OF SURGERY  Water Non-Citrus Juices (without pulp, NO RED) Carbonated Beverages Black Coffee (NO MILK/CREAM OR CREAMERS, sugar ok)  Clear Tea (NO MILK/CREAM OR CREAMERS, sugar ok) regular and decaf                             Plain Jell-O (NO RED)                                           Fruit ices (not with fruit pulp, NO RED)                                     Popsicles (NO RED)                                                               Sports drinks like Gatorade (NO RED)                 The day of surgery:  Drink ONE (1) Pre-Surgery G2 at 4:30 AM the morning of surgery. Drink in one sitting. Do not sip.  This drink was given to you during your hospital  pre-op appointment visit. Nothing else to drink after completing the  Pre-Surgery G2.          If you have questions, please contact your surgeon's office.   FOLLOW BOWEL PREP AND ANY ADDITIONAL PRE OP INSTRUCTIONS YOU RECEIVED FROM YOUR SURGEON'S OFFICE!!!     Oral Hygiene  is also important to reduce your risk of infection.                                    Remember - BRUSH YOUR TEETH THE MORNING OF SURGERY WITH YOUR REGULAR TOOTHPASTE   Take these medicines the morning of surgery with A SIP OF WATER: Tylenol, Flonase   DO NOT TAKE ANY ORAL DIABETIC MEDICATIONS DAY OF YOUR SURGERY  How to Manage Your Diabetes  Before and After Surgery  Why is it important to control my blood sugar before and after surgery? Improving blood sugar levels before and after surgery helps healing and can limit problems. A way of improving blood sugar control is eating a healthy diet by:  Eating less sugar and carbohydrates  Increasing activity/exercise  Talking with your doctor about reaching your blood sugar goals High blood sugars (greater than 180 mg/dL) can raise your risk of infections and slow your recovery, so you will need to focus on controlling your diabetes during the weeks before surgery. Make sure that the doctor who takes care of your diabetes knows about your planned surgery including the date and location.  How do I manage my blood sugar before surgery? Check your blood sugar at least 4 times a day, starting 2 days before surgery, to make sure that the level is not too high or low. Check your blood sugar the morning of your surgery when you wake up and every 2 hours until you get to the Short Stay unit. If your blood sugar is less than 70 mg/dL, you will need to treat for low blood sugar: Do not take insulin. Treat a low blood sugar (less than 70 mg/dL) with  cup of clear juice (cranberry or apple), 4 glucose tablets, OR glucose gel. Recheck blood sugar in 15 minutes after treatment (to make sure it is greater than 70 mg/dL). If your blood sugar is not greater than 70 mg/dL on recheck, call [phone] for further instructions. Report your blood sugar to the short stay nurse when you get to Short Stay.  If you are admitted to the hospital after surgery: Your  blood sugar will be checked by the staff and you will probably be given insulin after surgery (instead of oral diabetes medicines) to make sure you have good blood sugar levels. The goal for blood sugar control after surgery is 80-180 mg/dL.   WHAT DO I DO ABOUT MY DIABETES MEDICATION?  Do not take oral diabetes medicines (pills) the morning of surgery.  THE DAY BEFORE SURGERY, take Metformin and Januvia as prescribed.        THE MORNING OF SURGERY, do not take Metformin or Januvia.   Reviewed and Endorsed by [facility] Patient Education Committee, August 2015                              You may not have any metal on your body including jewelry, and body piercing             Do not wear lotions, powders, cologne, or deodorant              Men may shave face and neck.   Do not bring valuables to the hospital. [place].  DO NOT [place]. PHARMACY WILL DISPENSE MEDICATIONS LISTED ON YOUR MEDICATION LIST TO YOU DURING YOUR ADMISSION [place]!    Patients discharged on the day of surgery will not be allowed to drive home.  Someone NEEDS to stay with you for the first 24 hours after anesthesia.              Please read over the following fact sheets you were given: IF YOU HAVE QUESTIONS ABOUT YOUR PRE-OP INSTRUCTIONS PLEASE CALL [phone]- Apolonio Schneiders  If you received a COVID test during your pre-op visit  it is requested that you wear a mask when out in public, stay away from anyone that may not be feeling well and notify your surgeon if you develop symptoms. If you test positive for Covid or have been in contact with anyone that has tested positive in the last 10 days please notify you surgeon.    [facility] - Preparing for Surgery Before surgery, you can play an important role.  Because skin is not sterile, your skin needs to be as free of germs as possible.  You can reduce the number of  germs on your skin by washing with CHG (chlorahexidine gluconate) soap before surgery.  CHG is an antiseptic cleaner which kills germs and bonds with the skin to continue killing germs even after washing. Please DO NOT use if you have an allergy to CHG or antibacterial soaps.  If your skin becomes reddened/irritated stop using the CHG and inform your nurse when you arrive at Short Stay. Do not shave (including legs and underarms) for at least 48 hours prior to the first CHG shower.  You may shave your face/neck.  Please follow these instructions carefully:  1.  Shower with CHG Soap the night before surgery and the  morning of surgery.  2.  If you choose to wash your hair, wash your hair first as usual with your normal  shampoo.  3.  After you shampoo, rinse your hair and body thoroughly to remove the shampoo.                             4.  Use CHG as you would any other liquid soap.  You can apply chg directly to the skin and wash.  Gently with a scrungie or clean washcloth.  5.  Apply the CHG Soap to your body ONLY FROM THE NECK DOWN.   Do   not use on face/ open                           Wound or open sores. Avoid contact with eyes, ears mouth and   genitals (private parts).                       Wash face,  Genitals (private parts) with your normal soap.             6.  Wash thoroughly, paying special attention to the area where your    surgery  will be performed.  7.  Thoroughly rinse your body with warm water from the neck down.  8.  DO NOT shower/wash with your normal soap after using and rinsing off the CHG Soap.                9.  Pat yourself dry with a clean towel.            10.  Wear clean pajamas.            11.  Place clean sheets on your bed the night of your first shower and do not  sleep with pets. Day of Surgery : Do not apply any lotions/deodorants the morning of surgery.  Please wear clean clothes to the hospital/surgery center.  FAILURE TO FOLLOW THESE INSTRUCTIONS MAY  RESULT IN THE CANCELLATION OF YOUR SURGERY  PATIENT SIGNATURE_________________________________  NURSE SIGNATURE__________________________________  ________________________________________________________________________  Incentive Spirometer  An incentive spirometer is a tool that can help keep your lungs clear and active. This tool measures how well you are filling your lungs with each breath. Taking long deep breaths may help reverse or decrease the chance of developing breathing (pulmonary) problems (especially infection) following: A long period of time when you are unable to move or be active. BEFORE THE PROCEDURE  If the spirometer includes an indicator to show your best effort, your nurse or respiratory therapist will set it to a desired goal. If possible, sit up straight or lean slightly forward. Try not to slouch. Hold the incentive spirometer in an upright position. INSTRUCTIONS FOR USE  Sit on the edge of your bed if possible, or sit up as far as you can in bed or on a chair. Hold the incentive spirometer in an upright position. Breathe out normally. Place the mouthpiece in your mouth and seal your lips tightly around it. Breathe in slowly and as deeply as possible, raising the piston or the ball toward the top of the column. Hold your breath for 3-5 seconds or for as long as possible. Allow the piston or ball to fall to the bottom of the column. Remove the mouthpiece from your mouth and breathe out normally. Rest for a few seconds and repeat Steps 1 through 7 at least 10 times every 1-2 hours when you are awake. Take your time and take a few normal breaths between deep breaths. The spirometer may include an indicator to show your best effort. Use the indicator as a goal to work toward during each repetition. After each set of 10 deep breaths, practice coughing to be sure your lungs are clear. If you have an incision (the cut made at the time of surgery), support your incision  when coughing by placing a pillow or rolled up towels firmly against it. Once you are able to get out of bed, walk around indoors and cough well. You may stop using the incentive spirometer when instructed by your caregiver.  RISKS AND COMPLICATIONS Take your time so you do not get dizzy or light-headed. If you are in pain, you may need to take or ask for pain medication before doing incentive spirometry. It is harder to take a deep breath if you are having pain. AFTER USE Rest and breathe slowly and easily. It can be helpful to keep track of a log of your progress. Your caregiver can provide you with a simple table to help with this. If you are using the spirometer at home, follow these instructions: [place] IF:  You are having difficultly using the spirometer. You have trouble using the spirometer as often as instructed. Your pain medication is not giving enough relief while using the spirometer. You develop fever of 100.5 F (38.1 C) or higher. SEEK IMMEDIATE MEDICAL CARE IF:  You cough up bloody sputum that had not been present before. You develop fever of 102 F (38.9 C) or greater. You develop worsening pain at or near the incision site. MAKE SURE YOU:  Understand these instructions. Will watch your condition. Will get help right away if you are not doing well or get worse. Document Released: 10/07/2006 Document Revised: 08/19/2011 Document Reviewed: 12/08/2006 [employer] Patient Information 2014 [employer], [place].   ________________________________________________________________________ WHAT IS A BLOOD TRANSFUSION? Blood Transfusion Information  A transfusion is the replacement of blood or some of its parts. Blood is made up of multiple cells which provide different functions. Red blood cells carry  oxygen and are used for blood loss replacement. White blood cells fight against infection. Platelets control bleeding. Plasma helps clot blood. Other blood products are  available for specialized needs, such as hemophilia or other clotting disorders. BEFORE THE TRANSFUSION  Who gives blood for transfusions?  Healthy volunteers who are fully evaluated to make sure their blood is safe. This is blood bank blood. Transfusion therapy is the safest it has ever been in the practice of medicine. Before blood is taken from a donor, a complete history is taken to make sure that person has no history of diseases nor engages in risky social behavior (examples are intravenous drug use or sexual activity with multiple partners). The donor's travel history is screened to minimize risk of transmitting infections, such as malaria. The donated blood is tested for signs of infectious diseases, such as HIV and hepatitis. The blood is then tested to be sure it is compatible with you in order to minimize the chance of a transfusion reaction. If you or a relative donates blood, this is often done in anticipation of surgery and is not appropriate for emergency situations. It takes many days to process the donated blood. RISKS AND COMPLICATIONS Although transfusion therapy is very safe and saves many lives, the main dangers of transfusion include:  Getting an infectious disease. Developing a transfusion reaction. This is an allergic reaction to something in the blood you were given. Every precaution is taken to prevent this. The decision to have a blood transfusion has been considered carefully by your caregiver before blood is given. Blood is not given unless the benefits outweigh the risks. AFTER THE TRANSFUSION Right after receiving a blood transfusion, you will usually feel much better and more energetic. This is especially true if your red blood cells have gotten low (anemic). The transfusion raises the level of the red blood cells which carry oxygen, and this usually causes an energy increase. The nurse administering the transfusion will monitor you carefully for complications. HOME CARE  INSTRUCTIONS  No special instructions are needed after a transfusion. You may find your energy is better. Speak with your caregiver about any limitations on activity for underlying diseases you may have. SEEK MEDICAL CARE IF:  Your condition is not improving after your transfusion. You develop redness or irritation at the intravenous (IV) site. SEEK IMMEDIATE MEDICAL CARE IF:  Any of the following symptoms occur over the next 12 hours: Shaking chills. You have a temperature by mouth above 102 F (38.9 C), not controlled by medicine. Chest, back, or muscle pain. People around you feel you are not acting correctly or are confused. Shortness of breath or difficulty breathing. Dizziness and fainting. You get a rash or develop hives. You have a decrease in urine output. Your urine turns a dark color or changes to pink, red, or brown. Any of the following symptoms occur over the next 10 days: You have a temperature by mouth above 102 F (38.9 C), not controlled by medicine. Shortness of breath. Weakness after normal activity. The white part of the eye turns yellow (jaundice). You have a decrease in the amount of urine or are urinating less often. Your urine turns a dark color or changes to pink, red, or brown. Document Released: 05/24/2000 Document Revised: 08/19/2011 Document Reviewed: 01/11/2008 [facility] Patient Information 2014 [place], [place].  _______________________________________________________________________

## 2022-04-30 ENCOUNTER — Encounter (HOSPITAL_COMMUNITY): Payer: Self-pay

## 2022-04-30 ENCOUNTER — Encounter (HOSPITAL_COMMUNITY)
Admission: RE | Admit: 2022-04-30 | Discharge: 2022-04-30 | Disposition: A | Discharge status: Home / Self Care | Payer: Medicare PPO | Source: Ambulatory Visit | Attending: Orthopedic Surgery | Admitting: Orthopedic Surgery

## 2022-04-30 VITALS — BP 124/72 | HR 56 | Temp 97.8°F | Resp 12 | Ht 70.0 in | Wt 185.0 lb

## 2022-04-30 DIAGNOSIS — Z01818 Encounter for other preprocedural examination: Secondary | ICD-10-CM | POA: Insufficient documentation

## 2022-04-30 DIAGNOSIS — M25561 Pain in right knee: Secondary | ICD-10-CM | POA: Diagnosis not present

## 2022-04-30 DIAGNOSIS — G8929 Other chronic pain: Secondary | ICD-10-CM | POA: Diagnosis not present

## 2022-04-30 DIAGNOSIS — E119 Type 2 diabetes mellitus without complications: Secondary | ICD-10-CM | POA: Diagnosis not present

## 2022-04-30 HISTORY — DX: Pneumonia, unspecified organism: J18.9

## 2022-04-30 LAB — SURGICAL PCR SCREEN
MRSA, PCR: POSITIVE — AB
Staphylococcus aureus: POSITIVE — AB

## 2022-04-30 LAB — COMPREHENSIVE METABOLIC PANEL
ALT: 9 U/L (ref 0–44)
AST: 14 U/L — ABNORMAL LOW (ref 15–41)
Albumin: 3.7 g/dL (ref 3.5–5.0)
Alkaline Phosphatase: 37 U/L — ABNORMAL LOW (ref 38–126)
Anion gap: 8 (ref 5–15)
BUN: 15 mg/dL (ref 8–23)
CO2: 24 mmol/L (ref 22–32)
Calcium: 9.4 mg/dL (ref 8.9–10.3)
Chloride: 108 mmol/L (ref 98–111)
Creatinine, Ser: 1.11 mg/dL (ref 0.61–1.24)
GFR, Estimated: >60 mL/min (ref >60)
Glucose, Bld: 97 mg/dL (ref 70–99)
Potassium: 4.2 mmol/L (ref 3.5–5.1)
Sodium: 140 mmol/L (ref 135–145)
Total Bilirubin: 0.4 mg/dL (ref 0.3–1.2)
Total Protein: 7.5 g/dL (ref 6.5–8.1)

## 2022-04-30 LAB — CBC WITH DIFFERENTIAL/PLATELET
Abs Immature Granulocytes: 0.04 10*3/uL (ref 0.00–0.07)
Basophils Absolute: 0 10*3/uL (ref 0.0–0.1)
Basophils Relative: 0 %
Eosinophils Absolute: 0 10*3/uL (ref 0.0–0.5)
Eosinophils Relative: 1 %
HCT: 34.5 % — ABNORMAL LOW (ref 39.0–52.0)
Hemoglobin: 11.1 g/dL — ABNORMAL LOW (ref 13.0–17.0)
Immature Granulocytes: 1 %
Lymphocytes Relative: 12 %
Lymphs Abs: 0.9 10*3/uL (ref 0.7–4.0)
MCH: 32.8 pg (ref 26.0–34.0)
MCHC: 32.2 g/dL (ref 30.0–36.0)
MCV: 102.1 fL — ABNORMAL HIGH (ref 80.0–100.0)
Monocytes Absolute: 0.7 10*3/uL (ref 0.1–1.0)
Monocytes Relative: 10 %
Neutro Abs: 5.2 10*3/uL (ref 1.7–7.7)
Neutrophils Relative %: 76 %
Platelets: 278 10*3/uL (ref 150–400)
RBC: 3.38 MIL/uL — ABNORMAL LOW (ref 4.22–5.81)
RDW: 14.7 % (ref 11.5–15.5)
WBC: 6.9 10*3/uL (ref 4.0–10.5)
nRBC: 0 % (ref 0.0–0.2)

## 2022-04-30 LAB — HEMOGLOBIN A1C
Hgb A1c MFr Bld: 5.5 % (ref 4.8–5.6)
Mean Plasma Glucose: 111.15 mg/dL

## 2022-04-30 LAB — GLUCOSE, CAPILLARY: Glucose-Capillary: 87 mg/dL (ref 70–99)

## 2022-04-30 NOTE — Progress Notes (Signed)
See above

## 2022-04-30 NOTE — Progress Notes (Signed)
Patient scheduled to see you 05/06/22, he wanted to discuss results

## 2022-05-06 ENCOUNTER — Encounter: Payer: Self-pay | Admitting: Internal Medicine

## 2022-05-06 ENCOUNTER — Ambulatory Visit: Payer: Medicare PPO | Admitting: Internal Medicine

## 2022-05-06 VITALS — BP 137/72 | HR 56 | Ht 70.0 in | Wt 188.8 lb

## 2022-05-06 DIAGNOSIS — E119 Type 2 diabetes mellitus without complications: Secondary | ICD-10-CM

## 2022-05-06 DIAGNOSIS — I10 Essential (primary) hypertension: Secondary | ICD-10-CM | POA: Diagnosis not present

## 2022-05-06 DIAGNOSIS — E782 Mixed hyperlipidemia: Secondary | ICD-10-CM | POA: Diagnosis not present

## 2022-05-06 MED ORDER — ASPIRIN 81 MG PO TBEC: 81.0000 mg | DELAYED_RELEASE_TABLET | Freq: Every day | ORAL | 3 refills | Status: DC

## 2022-05-06 MED ORDER — TRANEXAMIC ACID 1000 MG/10ML IV SOLN
2000.0000 mg | INTRAVENOUS | Status: DC
  Filled 2022-05-06 (×2): qty 20

## 2022-05-06 NOTE — Progress Notes (Signed)
Primary Physician/Referring:  Lucianne Lei, MD  Patient ID: Larry Welch, male    DOB: 1945/10/31, 76 y.o.   MRN: [id number]  Chief Complaint  Patient presents with   Pre-operative clearance   Results   Follow-up   HPI:    Larry Welch  is a 76 y.o. male with past medical history significant for hypertension, hyperlipidemia, and diabetes who is here for a follow-up visit. Unfortunately, patient has knee replacement surgery scheduled for this Friday but he had a very abnormal nuclear stress test last month. Patient wanted to discuss these results in person prior to scheduling a procedure. He is agreeable to cardiac catheterization tomorrow with Dr Larry Welch. Patient understands if stent is placed his surgery will need to be delayed for at least 3 months and he is ok with this. He does recall having an episode in August where he called 911 due to feeling weak and out of it. Ultimately, no underlying cause was found for that event but patient now thinks it may have been heart related. Today, he denies chest pain, shortness of breath, palpitations, diaphoresis, syncope, edema, orthopnea, PND.  Past Medical History:  Diagnosis Date   Cancer [facility])    prostate   Diabetes mellitus    High cholesterol    Hypertension    Pneumonia    childhood   Past Surgical History:  Procedure Laterality Date   BACK SURGERY  2009   FOOT SURGERY  [phone]   Ganglion cyst inner right foot.   INSERTION PROSTATE RADIATION SEED     Family History  Problem Relation Age of Onset   Cancer Father        Prostate and Bladder    Social History   Tobacco Use   Smoking status: Former    Packs/day: 0.75    Years: 12.00    Total pack years: 9.00    Types: Cigarettes    Quit date: 06/10/1989    Years since quitting: 32.9   Smokeless tobacco: Never  Substance Use Topics   Alcohol use: Yes   Marital Status: Married  ROS  Review of Systems  Cardiovascular:  Negative for chest pain, claudication, cyanosis,  dyspnea on exertion, irregular heartbeat, leg swelling, near-syncope, orthopnea, palpitations, paroxysmal nocturnal dyspnea and syncope.   Objective  Blood pressure 137/72, pulse (!) 56, height 5' 10" (1.778 m), weight 188 lb 12.8 oz (85.6 kg), SpO2 99 %. Body mass index is 27.09 kg/m.     05/06/2022   10:20 AM 04/30/2022    9:16 AM 03/22/2022   10:43 AM  Vitals with BMI  Height 5' 10" 5' 10" 5' 10"  Weight 188 lbs 13 oz 185 lbs 187 lbs 13 oz  BMI 27.09 46.96 29.52  Systolic 841 324 401  Diastolic 72 72 78  Pulse 56 56 64     Physical Exam Vitals and nursing note reviewed.  Constitutional:      General: He is not in acute distress. HENT:     Head: Normocephalic and atraumatic.  Cardiovascular:     Rate and Rhythm: Normal rate and regular rhythm.     Pulses: Normal pulses.     Heart sounds: Normal heart sounds. No murmur heard.    No gallop.  Pulmonary:     Effort: Pulmonary effort is normal.     Breath sounds: Normal breath sounds.  Abdominal:     General: Bowel sounds are normal.  Musculoskeletal:     Right lower leg: No edema.  Left lower leg: No edema.  Skin:    General: Skin is warm and dry.  Neurological:     Mental Status: He is alert.    Medications and allergies  No Known Allergies   Medication list after today's encounter   Current Outpatient Medications:    acetaminophen (TYLENOL) 650 MG CR tablet, Take 650 mg by mouth every 8 (eight) hours as needed for pain., Disp: , Rfl:    amLODipine-olmesartan (AZOR) 10-40 MG per tablet, Take 1 tablet by mouth at bedtime., Disp: , Rfl:    aspirin EC 81 MG tablet, Take 1 tablet (81 mg total) by mouth daily., Disp: 90 tablet, Rfl: 3   Choline Fenofibrate (FENOFIBRIC ACID) 135 MG CPDR, Take 135 mg by mouth at bedtime. , Disp: , Rfl:    dorzolamide-timolol (COSOPT) 2-0.5 % ophthalmic solution, Place 1 drop into both eyes 2 (two) times daily., Disp: , Rfl:    fluticasone (FLONASE) 50 MCG/ACT nasal spray, Place 1  spray into both nostrils in the morning and at bedtime., Disp: , Rfl:    folic acid (FOLVITE) 1 MG tablet, Take 1 mg by mouth daily., Disp: , Rfl:    metFORMIN (GLUCOPHAGE) 1000 MG tablet, Take 1,000 mg by mouth daily with breakfast., Disp: , Rfl:    methotrexate (RHEUMATREX) 2.5 MG tablet, Take 15 mg by mouth every Tuesday., Disp: , Rfl:    rosuvastatin (CRESTOR) 20 MG tablet, Take 20 mg by mouth at bedtime., Disp: , Rfl:    sitaGLIPtin (JANUVIA) 100 MG tablet, Take 100 mg by mouth at bedtime., Disp: , Rfl:    solifenacin (VESICARE) 5 MG tablet, Take 5 mg by mouth daily., Disp: , Rfl:    spironolactone (ALDACTONE) 25 MG tablet, Take 25 mg by mouth daily., Disp: , Rfl:    valACYclovir (VALTREX) 1000 MG tablet, Take 1,000 mg by mouth 2 (two) times daily., Disp: , Rfl:   Laboratory examination:   Lab Results  Component Value Date   NA 140 04/30/2022   K 4.2 04/30/2022   CO2 24 04/30/2022   GLUCOSE 97 04/30/2022   BUN 15 04/30/2022   CREATININE 1.11 04/30/2022   CALCIUM 9.4 04/30/2022   GFRNONAA >60 04/30/2022       Latest Ref Rng & Units 04/30/2022    9:41 AM 04/04/2021    8:57 PM 11/19/2015    6:45 PM  CMP  Glucose 70 - 99 mg/dL 97  85  106   BUN 8 - 23 mg/dL _0 Creatinine 0.61 - 1.24 mg/dL 1.11  1.24  1.17   Sodium 135 - 145 mmol/L 140  137  141   Potassium 3.5 - 5.1 mmol/L 4.2  4.2  3.6   Chloride 98 - 111 mmol/L 108  105  110   CO2 22 - 32 mmol/L _1 Calcium 8.9 - 10.3 mg/dL 9.4  9.5  9.5   Total Protein 6.5 - 8.1 g/dL 7.5  7.3    Total Bilirubin 0.3 - 1.2 mg/dL 0.4  0.4    Alkaline Phos 38 - 126 U/L 37  33    AST 15 - 41 U/L 14  23    ALT 0 - 44 U/L 9  17        Latest Ref Rng & Units 04/30/2022    9:41 AM 04/04/2021    8:57 PM 11/19/2015    6:45 PM  CBC  WBC 4.0 - 10.5 K/uL  6.9  7.8  7.2   Hemoglobin 13.0 - 17.0 g/dL 11.1  12.1  12.7   Hematocrit 39.0 - 52.0 % 34.5  37.6  37.8   Platelets 150 - 400 K/uL 278  270  273     Lipid Panel No  results for input(s): "CHOL", "TRIG", "[place]", "VLDL", "HDL", "CHOLHDL", "LDLDIRECT" in the last 8760 hours.  HEMOGLOBIN A1C Lab Results  Component Value Date   HGBA1C 5.5 04/30/2022   MPG 111.15 04/30/2022   TSH No results for input(s): "TSH" in the last 8760 hours.  External labs:   03/08/2022 total cholesterol 118, HDL 41, triglycerides 88, LDL 60 Glucose 78, BUN 16, creatinine 1.28, potassium 4.8 Hemoglobin A1c 5.6  Radiology:    Cardiac Studies:   Exercise Tetrofosmin stress test 04/01/2022: Exercise nuclear stress test was performed using Bruce protocol. Patient reached 7 METS, and 86% of age predicted maximum heart rate. Exercise capacity was fair. No chest pain reported. Heart rate and hemodynamic response were normal. Stress EKG revealed no ischemic changes. SPECT images show small sized, severe intensity, reversible perfusion defect, with associated decreased myocardial thickening and wall motion. Stress LVEF mildly reduced at 44%. High risk study due to reduced LVEF and perfusion defect, as detailed.    EKG:   03/22/2022: NSR, normal axis, normal R wave progression, non-specific ST changes  Assessment     ICD-10-CM   1. Type 2 diabetes mellitus without complication, without long-term current use of insulin (HCC)  E11.9 CMP14+EGFR    CBC    2. Primary hypertension  I10 CMP14+EGFR    CBC    3. Mixed hyperlipidemia  E78.2 CMP14+EGFR    CBC        Orders Placed This Encounter  Procedures   CMP14+EGFR   CBC    Meds ordered this encounter  Medications   aspirin EC 81 MG tablet    Sig: Take 1 tablet (81 mg total) by mouth daily.    Dispense:  90 tablet    Refill:  3    Medications Discontinued During This Encounter  Medication Reason   doxycycline (VIBRAMYCIN) 100 MG capsule Completed Course     Recommendations:   Larry Welch is a 76 y.o.  M here for pre-op clearance  Pre-operative clearance Nuclear stress test grossly abnormal Plan for  cardiac catheterization tomorrow with Dr Larry Welch Aspirin sent, labs ordered, procedure discussed   Primary hypertension Continue current cardiac medications. Encourage low-sodium diet, less than 2000 mg daily.   Mixed hyperlipidemia Continue Crestor   Type 2 diabetes mellitus without complication, without long-term current use of insulin (HCC) Continue Januvia, metformin hgbA1c well controlled   Follow-up in 4 weeks or sooner if needed Schedule for cardiac catheterization, and possible angioplasty. We discussed regarding risks, benefits, alternatives to this including stress testing, CTA and continued medical therapy. Patient wants to proceed. Understands <1-2% risk of death, stroke, MI, urgent CABG, bleeding, infection, renal failure but not limited to these. Patient instructed not to do heavy lifting, heavy exertional activity, swimming until evaluation is complete.  Patient instructed to call if symptoms worse or to go to the ED for further evaluation.     Larry Flock, DO, [facility]  05/06/2022, 11:49 AM Office: [phone] Pager: [phone]

## 2022-05-07 ENCOUNTER — Encounter (HOSPITAL_COMMUNITY): Payer: Self-pay | Admitting: Physician Assistant

## 2022-05-07 LAB — CMP14+EGFR
ALT: 10 IU/L (ref 0–44)
AST: 14 IU/L (ref 0–40)
Albumin/Globulin Ratio: 1.6 (ref 1.2–2.2)
Albumin: 4.4 g/dL (ref 3.8–4.8)
Alkaline Phosphatase: 41 IU/L — ABNORMAL LOW (ref 44–121)
BUN/Creatinine Ratio: 10 (ref 10–24)
BUN: 13 mg/dL (ref 8–27)
Bilirubin Total: <0.2 mg/dL (ref 0.0–1.2)
CO2: 23 mmol/L (ref 20–29)
Calcium: 9.9 mg/dL (ref 8.6–10.2)
Chloride: 107 mmol/L — ABNORMAL HIGH (ref 96–106)
Creatinine, Ser: 1.27 mg/dL (ref 0.76–1.27)
Globulin, Total: 2.7 g/dL (ref 1.5–4.5)
Glucose: 78 mg/dL (ref 70–99)
Potassium: 4.9 mmol/L (ref 3.5–5.2)
Sodium: 144 mmol/L (ref 134–144)
Total Protein: 7.1 g/dL (ref 6.0–8.5)
eGFR: 59 mL/min/{1.73_m2} — ABNORMAL LOW (ref >59)

## 2022-05-07 LAB — CBC
Hematocrit: 34 % — ABNORMAL LOW (ref 37.5–51.0)
Hemoglobin: 11.5 g/dL — ABNORMAL LOW (ref 13.0–17.7)
MCH: 33.1 pg — ABNORMAL HIGH (ref 26.6–33.0)
MCHC: 33.8 g/dL (ref 31.5–35.7)
MCV: 98 fL — ABNORMAL HIGH (ref 79–97)
Platelets: 391 10*3/uL (ref 150–450)
RBC: 3.47 x10E6/uL — ABNORMAL LOW (ref 4.14–5.80)
RDW: 14.2 % (ref 11.6–15.4)
WBC: 5.4 10*3/uL (ref 3.4–10.8)

## 2022-05-07 LAB — PCV MYOCARDIAL PERFUSION WO LEXISCAN
Angina Index: 0
ST Elevation (mm): 1 mm

## 2022-05-07 NOTE — Anesthesia Preprocedure Evaluation (Addendum)
Anesthesia Evaluation  Patient identified by MRN, date of birth, ID band Patient awake    Reviewed: Allergy & Precautions, H&P , NPO status , Patient's Chart, lab work & pertinent test results  Airway Mallampati: II  TM Distance: >3 FB Neck ROM: Full    Dental no notable dental hx.    Pulmonary sleep apnea , former smoker   Pulmonary exam normal breath sounds clear to auscultation       Cardiovascular hypertension, Pt. on medications Normal cardiovascular exam Rhythm:Regular Rate:Normal     Neuro/Psych negative neurological ROS  negative psych ROS   GI/Hepatic negative GI ROS, Neg liver ROS,,,  Endo/Other  diabetes, Type 2    Renal/GU negative Renal ROS  negative genitourinary   Musculoskeletal negative musculoskeletal ROS (+)    Abdominal   Peds negative pediatric ROS (+)  Hematology negative hematology ROS (+)   Anesthesia Other Findings   Reproductive/Obstetrics negative OB ROS                             Anesthesia Physical Anesthesia Plan  ASA: 3  Anesthesia Plan: Spinal   Post-op Pain Management: Regional block*   Induction: Intravenous  PONV Risk Score and Plan: 1 and Propofol infusion, Ondansetron and Treatment may vary due to age or medical condition  Airway Management Planned: Simple Face Mask  Additional Equipment:   Intra-op Plan:   Post-operative Plan:   Informed Consent: I have reviewed the patients History and Physical, chart, labs and discussed the procedure including the risks, benefits and alternatives for the proposed anesthesia with the patient or authorized representative who has indicated his/her understanding and acceptance.     Dental advisory given  Plan Discussed with: CRNA and Surgeon  Anesthesia Plan Comments: (See PAT note 05/07/2022)       Anesthesia Quick Evaluation

## 2022-05-07 NOTE — Progress Notes (Signed)
Anesthesia Chart Review   Case: [id number] Date/Time: 05/10/22 0830   Procedure: TOTAL KNEE ARTHROPLASTY (Right: Knee)   Anesthesia type: Choice   Pre-op diagnosis: OA RIGHT KNEE   Location: [place] / [facility]   Surgeons: Earlie Server, MD       DISCUSSION:76 y.o. former smoker with h/o HTN, DM II, right knee OA scheduled for above procedure 05/10/2022 with Dr. Earlie Server.    Pt seen by cardiology 03/22/2022 for preoperative evaluation.  Stress test ordered for risk stratification.    Seen by Dr. Shellia Carwin following stress test.  Per cardiology note 05/06/2022, "Nuclear stress test reviewed again and report addended to more accurately reflect risk stratification. While stress test is intermediate risk, patient had good exercise and functional capacity, no EKG changes definitive of ischemia. He has no angina symptoms at baseline and has >4 METS functional capacity, as reflected during his stress test. His perioperative risk for knee surgery is not elevated. Recommend proceeding with knee surgery as scheduled. Continue outpatient follow up for cardiac risk factor modification."  Cardiology is ordering an echo to be done after his upcoming knee surgery.  VS: There were no vitals taken for this visit.  PROVIDERS: Lucianne Lei, MD is PCP    LABS: Labs reviewed: Acceptable for surgery. (all labs ordered are listed, but only abnormal results are displayed)  Labs Reviewed - No data to display   IMAGES:   EKG:   CV: Exercise Tetrofosmin stress test 04/01/2022: Exercise nuclear stress test was performed using Bruce protocol. Patient reached 7 METS, and 86% of age predicted maximum heart rate. Exercise capacity was fair. No chest pain reported. Heart rate and hemodynamic response were normal.  Stress EKG showed sinus tachycardia, <1 mm upsloping ST depression in leads II, III, aVF that normalize by 2 min into recovery. These changes are equivocal for ischemia.  SPECT images show  small sized, severe intensity, reversible perfusion defect in apical anteroseptal myocardium, with associated decreased myocardial thickening and wall motion. Stress LVEF mildly reduced at 44%.  Intermediate risk study.  Past Medical History:  Diagnosis Date   Cancer [facility])    prostate   Diabetes mellitus    High cholesterol    Hypertension    Pneumonia    childhood    Past Surgical History:  Procedure Laterality Date   BACK SURGERY  2009   FOOT SURGERY  [phone]   Ganglion cyst inner right foot.   INSERTION PROSTATE RADIATION SEED      MEDICATIONS: No current facility-administered medications for this encounter.    acetaminophen (TYLENOL) 650 MG CR tablet   amLODipine-olmesartan (AZOR) 10-40 MG per tablet   Choline Fenofibrate (FENOFIBRIC ACID) 135 MG CPDR   dorzolamide-timolol (COSOPT) 2-0.5 % ophthalmic solution   fluticasone (FLONASE) 50 MCG/ACT nasal spray   folic acid (FOLVITE) 1 MG tablet   metFORMIN (GLUCOPHAGE) 1000 MG tablet   methotrexate (RHEUMATREX) 2.5 MG tablet   rosuvastatin (CRESTOR) 20 MG tablet   sitaGLIPtin (JANUVIA) 100 MG tablet   solifenacin (VESICARE) 5 MG tablet   spironolactone (ALDACTONE) 25 MG tablet   valACYclovir (VALTREX) 1000 MG tablet   aspirin EC 81 MG tablet    tranexamic acid (CYKLOKAPRON) 2,000 mg in sodium chloride 0.9 % 50 mL Topical Application    Shigeru Lampert Ward, PA-C [facility] [phone]

## 2022-05-07 NOTE — Addendum Note (Signed)
Addended by: Floydene Flock on: 05/07/2022 08:58 AM   Modules accepted: Orders

## 2022-05-08 ENCOUNTER — Ambulatory Visit: Payer: Medicare PPO

## 2022-05-08 DIAGNOSIS — I10 Essential (primary) hypertension: Secondary | ICD-10-CM | POA: Diagnosis not present

## 2022-05-09 ENCOUNTER — Ambulatory Visit: Payer: Self-pay | Admitting: Physician Assistant

## 2022-05-10 ENCOUNTER — Other Ambulatory Visit: Payer: Self-pay

## 2022-05-10 ENCOUNTER — Ambulatory Visit (HOSPITAL_BASED_OUTPATIENT_CLINIC_OR_DEPARTMENT_OTHER): Payer: Medicare PPO | Admitting: Physician Assistant

## 2022-05-10 ENCOUNTER — Ambulatory Visit (HOSPITAL_COMMUNITY)
Admission: RE | Admit: 2022-05-10 | Discharge: 2022-05-10 | Disposition: A | Discharge status: Home / Self Care | Payer: Medicare PPO | Source: Ambulatory Visit | Attending: Orthopedic Surgery | Admitting: Orthopedic Surgery

## 2022-05-10 ENCOUNTER — Ambulatory Visit (HOSPITAL_COMMUNITY): Payer: Medicare PPO | Admitting: Physician Assistant

## 2022-05-10 ENCOUNTER — Encounter (HOSPITAL_COMMUNITY): Payer: Self-pay | Admitting: Orthopedic Surgery

## 2022-05-10 ENCOUNTER — Encounter (HOSPITAL_COMMUNITY)
Admission: RE | Disposition: A | Discharge status: Home / Self Care | Payer: Self-pay | Source: Ambulatory Visit | Attending: Orthopedic Surgery

## 2022-05-10 DIAGNOSIS — M1711 Unilateral primary osteoarthritis, right knee: Secondary | ICD-10-CM | POA: Insufficient documentation

## 2022-05-10 DIAGNOSIS — G4733 Obstructive sleep apnea (adult) (pediatric): Secondary | ICD-10-CM | POA: Insufficient documentation

## 2022-05-10 DIAGNOSIS — M71061 Abscess of bursa, right knee: Secondary | ICD-10-CM | POA: Diagnosis not present

## 2022-05-10 DIAGNOSIS — M71161 Other infective bursitis, right knee: Secondary | ICD-10-CM | POA: Diagnosis not present

## 2022-05-10 DIAGNOSIS — I10 Essential (primary) hypertension: Secondary | ICD-10-CM

## 2022-05-10 DIAGNOSIS — L02425 Furuncle of right lower limb: Secondary | ICD-10-CM | POA: Diagnosis not present

## 2022-05-10 DIAGNOSIS — Z87891 Personal history of nicotine dependence: Secondary | ICD-10-CM

## 2022-05-10 DIAGNOSIS — Z01818 Encounter for other preprocedural examination: Secondary | ICD-10-CM

## 2022-05-10 DIAGNOSIS — E119 Type 2 diabetes mellitus without complications: Secondary | ICD-10-CM | POA: Diagnosis not present

## 2022-05-10 DIAGNOSIS — G4730 Sleep apnea, unspecified: Secondary | ICD-10-CM

## 2022-05-10 DIAGNOSIS — G8918 Other acute postprocedural pain: Secondary | ICD-10-CM | POA: Diagnosis not present

## 2022-05-10 HISTORY — PX: INCISION AND DRAINAGE: SHX5863

## 2022-05-10 LAB — GLUCOSE, CAPILLARY
Glucose-Capillary: 80 mg/dL (ref 70–99)
Glucose-Capillary: 103 mg/dL — ABNORMAL HIGH (ref 70–99)

## 2022-05-10 LAB — TYPE AND SCREEN
ABO/RH(D): O POS
Antibody Screen: NEGATIVE

## 2022-05-10 SURGERY — INCISION AND DRAINAGE
Anesthesia: Spinal | Site: Knee | Laterality: Right

## 2022-05-10 MED ORDER — SODIUM CHLORIDE 0.9 % IV SOLN: INTRAVENOUS | Status: DC

## 2022-05-10 MED ORDER — FENTANYL CITRATE PF 50 MCG/ML IJ SOSY
PREFILLED_SYRINGE | INTRAMUSCULAR | Status: AC
  Administered 2022-05-10: 50 ug
  Filled 2022-05-10: qty 2

## 2022-05-10 MED ORDER — VANCOMYCIN HCL 1000 MG IV SOLR
INTRAVENOUS | Status: DC | PRN
  Administered 2022-05-10: 1000 mg

## 2022-05-10 MED ORDER — CEFAZOLIN SODIUM-DEXTROSE 2-4 GM/100ML-% IV SOLN
2.0000 g | INTRAVENOUS | Status: AC
  Administered 2022-05-10: 2 g via INTRAVENOUS
  Filled 2022-05-10: qty 100

## 2022-05-10 MED ORDER — BUPIVACAINE-EPINEPHRINE (PF) 0.25% -1:200000 IJ SOLN: INTRAMUSCULAR | Status: DC | PRN

## 2022-05-10 MED ORDER — EPHEDRINE SULFATE-NACL 50-0.9 MG/10ML-% IV SOSY
PREFILLED_SYRINGE | INTRAVENOUS | Status: DC | PRN
  Administered 2022-05-10: 10 mg via INTRAVENOUS
  Administered 2022-05-10: 5 mg via INTRAVENOUS
  Administered 2022-05-10: 10 mg via INTRAVENOUS

## 2022-05-10 MED ORDER — LIDOCAINE 2% (20 MG/ML) 5 ML SYRINGE
INTRAMUSCULAR | Status: DC | PRN
  Administered 2022-05-10: 100 mg via INTRAVENOUS

## 2022-05-10 MED ORDER — OXYCODONE HCL 5 MG/5ML PO SOLN: 5.0000 mg | Freq: Once | ORAL | Status: AC | PRN

## 2022-05-10 MED ORDER — FENTANYL CITRATE (PF) 100 MCG/2ML IJ SOLN
INTRAMUSCULAR | Status: AC
  Filled 2022-05-10: qty 2

## 2022-05-10 MED ORDER — OXYCODONE HCL 5 MG PO TABS
ORAL_TABLET | ORAL | Status: AC
  Filled 2022-05-10: qty 1

## 2022-05-10 MED ORDER — LACTATED RINGERS IV SOLN: INTRAVENOUS | Status: DC

## 2022-05-10 MED ORDER — HYDROMORPHONE HCL 1 MG/ML IJ SOLN
INTRAMUSCULAR | Status: AC
  Filled 2022-05-10: qty 1

## 2022-05-10 MED ORDER — BUPIVACAINE LIPOSOME 1.3 % IJ SUSP
INTRAMUSCULAR | Status: AC
  Filled 2022-05-10: qty 20

## 2022-05-10 MED ORDER — ONDANSETRON HCL 4 MG/2ML IJ SOLN
INTRAMUSCULAR | Status: AC
  Filled 2022-05-10: qty 2

## 2022-05-10 MED ORDER — OXYCODONE HCL 5 MG PO TABS
5.0000 mg | ORAL_TABLET | Freq: Once | ORAL | Status: AC | PRN
  Administered 2022-05-10: 5 mg via ORAL

## 2022-05-10 MED ORDER — PROPOFOL 10 MG/ML IV BOLUS
INTRAVENOUS | Status: DC | PRN
  Administered 2022-05-10: 150 mg via INTRAVENOUS

## 2022-05-10 MED ORDER — VANCOMYCIN HCL 1000 MG IV SOLR
INTRAVENOUS | Status: AC
  Filled 2022-05-10: qty 20

## 2022-05-10 MED ORDER — ACETAMINOPHEN 500 MG PO TABS
1000.0000 mg | ORAL_TABLET | Freq: Once | ORAL | Status: AC
  Administered 2022-05-10: 1000 mg via ORAL
  Filled 2022-05-10: qty 2

## 2022-05-10 MED ORDER — SODIUM CHLORIDE (PF) 0.9 % IJ SOLN
INTRAMUSCULAR | Status: AC
  Filled 2022-05-10: qty 50

## 2022-05-10 MED ORDER — HYDROMORPHONE HCL 1 MG/ML IJ SOLN
0.2500 mg | INTRAMUSCULAR | Status: DC | PRN
  Administered 2022-05-10 (×4): 0.5 mg via INTRAVENOUS

## 2022-05-10 MED ORDER — PROPOFOL 1000 MG/100ML IV EMUL
INTRAVENOUS | Status: AC
  Filled 2022-05-10: qty 100

## 2022-05-10 MED ORDER — PROPOFOL 10 MG/ML IV BOLUS
INTRAVENOUS | Status: AC
  Filled 2022-05-10: qty 20

## 2022-05-10 MED ORDER — BUPIVACAINE LIPOSOME 1.3 % IJ SUSP: 20.0000 mL | Freq: Once | INTRAMUSCULAR | Status: DC

## 2022-05-10 MED ORDER — 0.9 % SODIUM CHLORIDE (POUR BTL) OPTIME
TOPICAL | Status: DC | PRN
  Administered 2022-05-10: 1000 mL

## 2022-05-10 MED ORDER — TRANEXAMIC ACID-NACL 1000-0.7 MG/100ML-% IV SOLN
1000.0000 mg | INTRAVENOUS | Status: DC
  Filled 2022-05-10 (×2): qty 100

## 2022-05-10 MED ORDER — ONDANSETRON HCL 4 MG/2ML IJ SOLN: 4.0000 mg | Freq: Once | INTRAMUSCULAR | Status: DC | PRN

## 2022-05-10 MED ORDER — DEXAMETHASONE SODIUM PHOSPHATE 10 MG/ML IJ SOLN
INTRAMUSCULAR | Status: AC
  Filled 2022-05-10: qty 1

## 2022-05-10 MED ORDER — FENTANYL CITRATE (PF) 100 MCG/2ML IJ SOLN
INTRAMUSCULAR | Status: DC | PRN
  Administered 2022-05-10: 25 ug via INTRAVENOUS
  Administered 2022-05-10: 50 ug via INTRAVENOUS

## 2022-05-10 MED ORDER — ORAL CARE MOUTH RINSE: 15.0000 mL | Freq: Once | OROMUCOSAL | Status: AC

## 2022-05-10 MED ORDER — VANCOMYCIN HCL IN DEXTROSE 1-5 GM/200ML-% IV SOLN
1000.0000 mg | INTRAVENOUS | Status: AC
  Administered 2022-05-10: 1000 mg via INTRAVENOUS
  Filled 2022-05-10: qty 200

## 2022-05-10 MED ORDER — HYDROCODONE-ACETAMINOPHEN 5-325 MG PO TABS: 1.0000 | ORAL_TABLET | ORAL | 0 refills | Status: DC | PRN

## 2022-05-10 MED ORDER — BUPIVACAINE-EPINEPHRINE (PF) 0.25% -1:200000 IJ SOLN
INTRAMUSCULAR | Status: AC
  Filled 2022-05-10: qty 30

## 2022-05-10 MED ORDER — ASPIRIN 81 MG PO TBEC: 81.0000 mg | DELAYED_RELEASE_TABLET | Freq: Two times a day (BID) | ORAL | 3 refills | Status: AC

## 2022-05-10 MED ORDER — CHLORHEXIDINE GLUCONATE 0.12 % MT SOLN
15.0000 mL | Freq: Once | OROMUCOSAL | Status: AC
  Administered 2022-05-10: 15 mL via OROMUCOSAL

## 2022-05-10 MED ORDER — SODIUM CHLORIDE 0.9 % IR SOLN
Status: DC | PRN
  Administered 2022-05-10: 1000 mL

## 2022-05-10 MED ORDER — DOXYCYCLINE MONOHYDRATE 100 MG PO TABS: 100.0000 mg | ORAL_TABLET | Freq: Two times a day (BID) | ORAL | 0 refills | Status: AC

## 2022-05-10 MED ORDER — DEXAMETHASONE SODIUM PHOSPHATE 10 MG/ML IJ SOLN
INTRAMUSCULAR | Status: DC | PRN
  Administered 2022-05-10: 4 mg via INTRAVENOUS

## 2022-05-10 MED ORDER — ONDANSETRON HCL 4 MG/2ML IJ SOLN
INTRAMUSCULAR | Status: DC | PRN
  Administered 2022-05-10: 4 mg via INTRAVENOUS

## 2022-05-10 MED ORDER — ROPIVACAINE HCL 7.5 MG/ML IJ SOLN
INTRAMUSCULAR | Status: DC | PRN
  Administered 2022-05-10: 20 mL via PERINEURAL

## 2022-05-10 MED ORDER — POVIDONE-IODINE 10 % EX SWAB
2.0000 | Freq: Once | CUTANEOUS | Status: AC
  Administered 2022-05-10: 2 via TOPICAL

## 2022-05-10 MED ORDER — LIDOCAINE HCL (PF) 2 % IJ SOLN
INTRAMUSCULAR | Status: AC
  Filled 2022-05-10: qty 5

## 2022-05-10 SURGICAL SUPPLY — 55 items
BAG COUNTER SPONGE SURGICOUNT (BAG) ×1 IMPLANT
BAG DECANTER FOR FLEXI CONT (MISCELLANEOUS) ×1 IMPLANT
BAG ZIPLOCK 12X15 (MISCELLANEOUS) ×1 IMPLANT
BENZOIN TINCTURE PRP APPL 2/3 (GAUZE/BANDAGES/DRESSINGS) ×1 IMPLANT
BLADE SAGITTAL 25.0X1.19X90 (BLADE) ×1 IMPLANT
BLADE SAW SGTL 13X75X1.27 (BLADE) ×1 IMPLANT
BLADE SURG 15 STRL LF DISP TIS (BLADE) ×1 IMPLANT
BLADE SURG 15 STRL SS (BLADE) ×1
BLADE SURG SZ10 CARB STEEL (BLADE) ×2 IMPLANT
BNDG ELASTIC 6X10 VLCR STRL LF (GAUZE/BANDAGES/DRESSINGS) IMPLANT
BNDG ELASTIC 6X15 VLCR STRL LF (GAUZE/BANDAGES/DRESSINGS) ×1 IMPLANT
BNDG GAUZE DERMACEA FLUFF 4 (GAUZE/BANDAGES/DRESSINGS) IMPLANT
BOWL SMART MIX CTS (DISPOSABLE) ×1 IMPLANT
CLSR STERI-STRIP ANTIMIC 1/2X4 (GAUZE/BANDAGES/DRESSINGS) ×2 IMPLANT
COVER SURGICAL LIGHT HANDLE (MISCELLANEOUS) ×1 IMPLANT
CUFF TOURN SGL QUICK 34 (TOURNIQUET CUFF) ×1
CUFF TRNQT CYL 34X4.125X (TOURNIQUET CUFF) ×1 IMPLANT
DRAPE INCISE IOBAN 66X45 STRL (DRAPES) ×1 IMPLANT
DRAPE U-SHAPE 47X51 STRL (DRAPES) ×1 IMPLANT
DRESSING AQUACEL AG SP 3.5X10 (GAUZE/BANDAGES/DRESSINGS) ×1 IMPLANT
DRSG AQUACEL AG SP 3.5X10 (GAUZE/BANDAGES/DRESSINGS) ×1
DURAPREP 26ML APPLICATOR (WOUND CARE) ×2 IMPLANT
ELECT REM PT RETURN 15FT ADLT (MISCELLANEOUS) ×1 IMPLANT
GAUZE PAD ABD 8X10 STRL (GAUZE/BANDAGES/DRESSINGS) IMPLANT
GAUZE SPONGE 4X4 12PLY STRL (GAUZE/BANDAGES/DRESSINGS) IMPLANT
GAUZE XEROFORM 1X8 LF (GAUZE/BANDAGES/DRESSINGS) IMPLANT
GLOVE BIOGEL PI IND STRL 8 (GLOVE) ×2 IMPLANT
GLOVE SURG ORTHO 8.0 STRL STRW (GLOVE) ×1 IMPLANT
GLOVE SURG POLYISO LF SZ7.5 (GLOVE) ×1 IMPLANT
GOWN STRL REUS W/ TWL XL LVL3 (GOWN DISPOSABLE) ×2 IMPLANT
GOWN STRL REUS W/TWL XL LVL3 (GOWN DISPOSABLE) ×2
HANDPIECE INTERPULSE COAX TIP (DISPOSABLE) ×1
HOLDER FOLEY CATH W/STRAP (MISCELLANEOUS) IMPLANT
HOOD PEEL AWAY T7 (MISCELLANEOUS) ×1 IMPLANT
IMMOBILIZER KNEE 20 (SOFTGOODS) ×1
IMMOBILIZER KNEE 20 THIGH 36 (SOFTGOODS) ×1 IMPLANT
KIT TURNOVER KIT A (KITS) IMPLANT
MANIFOLD NEPTUNE II (INSTRUMENTS) ×1 IMPLANT
NEEDLE HYPO 22GX1.5 SAFETY (NEEDLE) ×2 IMPLANT
NS IRRIG 1000ML POUR BTL (IV SOLUTION) ×1 IMPLANT
PACK TOTAL KNEE CUSTOM (KITS) ×1 IMPLANT
PROTECTOR NERVE ULNAR (MISCELLANEOUS) ×1 IMPLANT
SET HNDPC FAN SPRY TIP SCT (DISPOSABLE) ×1 IMPLANT
SPIKE FLUID TRANSFER (MISCELLANEOUS) ×2 IMPLANT
SUT ETHIBOND NAB CT1 #1 30IN (SUTURE) ×2 IMPLANT
SUT MNCRL AB 3-0 PS2 18 (SUTURE) ×1 IMPLANT
SUT VIC AB 0 CT1 36 (SUTURE) ×1 IMPLANT
SUT VIC AB 2-0 CT1 27 (SUTURE) ×2
SUT VIC AB 2-0 CT1 TAPERPNT 27 (SUTURE) ×2 IMPLANT
SYR CONTROL 10ML LL (SYRINGE) ×3 IMPLANT
TOWEL OR 17X26 10 PK STRL BLUE (TOWEL DISPOSABLE) ×1 IMPLANT
TRAY FOLEY MTR SLVR 16FR STAT (SET/KITS/TRAYS/PACK) ×1 IMPLANT
TUBE SUCTION HIGH CAP CLEAR NV (SUCTIONS) ×1 IMPLANT
WATER STERILE IRR 1000ML POUR (IV SOLUTION) ×2 IMPLANT
WRAP KNEE MAXI GEL POST OP (GAUZE/BANDAGES/DRESSINGS) ×1 IMPLANT

## 2022-05-10 NOTE — Discharge Instructions (Addendum)
Diet: As you were doing prior to hospitalization   Activity: Increase activity slowly as tolerated  No lifting or driving for 48 hours   Dressing: You should keep dressing in place until followup.  Keep clean and dry.  Weight Bearing: weight bearing as tolerated.  To prevent constipation: you may use a stool softener such as -  Colace ( over the counter) 100 mg by mouth twice a day  Drink plenty of fluids ( prune juice may be helpful) and high fiber foods  Miralax ( over the counter) for constipation as needed.   Precautions: If you experience chest pain or shortness of breath - call 911 immediately For transfer to the hospital emergency department!!  If you develop a fever greater that 101 F, purulent drainage from wound, increased redness or drainage from wound, or calf pain -- Call the office   Follow- Up Appointment: Please call for an appointment to be seen on Tues of next week [facility] - [phone]

## 2022-05-10 NOTE — Brief Op Note (Signed)
05/10/2022  10:09 AM  PATIENT:  Larry Welch  76 y.o. male  PRE-OPERATIVE DIAGNOSIS:  RIGHT KNEE INFECTED BURSA  POST-OPERATIVE DIAGNOSIS:  RIGHT KNEE INFECTED BURSA  PROCEDURE:  Procedure(s): INCISION AND DRAINAGE OF RIGHT KNEE BURSA, PREPATELLAR BURSECTOMY (Right)  SURGEON:  Surgeon(s) and Role:    * Earlie Server, MD - Primary  PHYSICIAN ASSISTANT: Chriss Czar, PA-C  ASSISTANTS:    ANESTHESIA:   regional and general  EBL:  10 mL   BLOOD ADMINISTERED:none  DRAINS: none   LOCAL MEDICATIONS USED:  NONE  SPECIMEN:  Source of Specimen:  R knee prepatellar bursal tissue   DISPOSITION OF SPECIMEN:   micro  COUNTS:  YES  TOURNIQUET:  tourniquet right thigh   DICTATION: .Other Dictation: Dictation Number unknown  PLAN OF CARE: Discharge to home after PACU  PATIENT DISPOSITION:  PACU - hemodynamically stable.   Delay start of Pharmacological VTE agent (>24hrs) due to surgical blood loss or risk of bleeding: yes

## 2022-05-10 NOTE — Anesthesia Procedure Notes (Signed)
Anesthesia Regional Block: Adductor canal block   Pre-Anesthetic Checklist: , timeout performed,  Correct Patient, Correct Site, Correct Laterality,  Correct Procedure, Correct Position, site marked,  Risks and benefits discussed,  Surgical consent,  Pre-op evaluation,  At surgeon's request and post-op pain management  Laterality: Right  Prep: chloraprep       Needles:  Injection technique: Single-shot  Needle Type: Echogenic Needle     Needle Length: 9cm      Additional Needles:   Procedures:,,,, ultrasound used (permanent image in chart),,    Narrative:  Start time: 05/10/2022 8:04 AM End time: 05/10/2022 8:09 AM Injection made incrementally with aspirations every 5 mL.  Performed by: Personally  Anesthesiologist: Myrtie Soman, MD  Additional Notes: Patient tolerated the procedure well without complications

## 2022-05-10 NOTE — Anesthesia Procedure Notes (Signed)
Procedure Name: LMA Insertion Date/Time: 05/10/2022 9:11 AM  Performed by: Eben Burow, CRNAPre-anesthesia Checklist: Patient identified, Emergency Drugs available, Suction available, Patient being monitored and Timeout performed Patient Re-evaluated:Patient Re-evaluated prior to induction Oxygen Delivery Method: Circle system utilized Preoxygenation: Pre-oxygenation with 100% oxygen Induction Type: IV induction Ventilation: Mask ventilation without difficulty LMA: LMA inserted LMA Size: 4.0 Number of attempts: 1 Tube secured with: Tape Dental Injury: Teeth and Oropharynx as per pre-operative assessment

## 2022-05-10 NOTE — Transfer of Care (Signed)
Immediate Anesthesia Transfer of Care Note  Patient: Larry Welch  Procedure(s) Performed: INCISION AND DRAINAGE OF RIGHT KNEE BURSA, PREPATELLAR BURSECTOMY (Right: Knee)  Patient Location: PACU  Anesthesia Type:General  Level of Consciousness: awake, drowsy, and patient cooperative  Airway & Oxygen Therapy: Patient Spontanous Breathing and Patient connected to face mask oxygen  Post-op Assessment: Report given to RN and Post -op Vital signs reviewed and stable  Post vital signs: Reviewed and stable  Last Vitals:  Vitals Value Taken Time  BP 154/84 05/10/22 1004  Temp    Pulse 59 05/10/22 1007  Resp 10 05/10/22 1007  SpO2 100 % 05/10/22 1007  Vitals shown include unvalidated device data.  Last Pain:  Vitals:   05/10/22 0701  TempSrc:   PainSc: 5       Patients Stated Pain Goal: 4 (90/12/22 4114)  Complications: No notable events documented.

## 2022-05-10 NOTE — Interval H&P Note (Signed)
History and Physical Interval Note:  05/10/2022 7:34 AM  Larry Welch  has presented today for surgery, with the diagnosis of OA RIGHT KNEE.  The various methods of treatment have been discussed with the patient and family. After consideration of risks, benefits and other options for treatment, the patient has consented to  Procedure(s): TOTAL KNEE ARTHROPLASTY (Right) as a surgical intervention.  The patient's history has been reviewed, patient examined, no change in status, stable for surgery.  I have reviewed the patient's chart and labs.  Questions were answered to the patient's satisfaction.     Yvette Rack

## 2022-05-10 NOTE — Anesthesia Procedure Notes (Signed)
Anesthesia Procedure Image    

## 2022-05-10 NOTE — Op Note (Signed)
Larry Welch, Larry Welch MEDICAL RECORD NO: [id number] ACCOUNT NO: [id number] DATE OF BIRTH: 08/01/1945 FACILITY: [facility] LOCATION: [facility] PHYSICIAN: W D. Valeta Harms., MD  Operative Report   DATE OF PROCEDURE: 05/10/2022   PREOPERATIVE DIAGNOSIS:  Infected right prepatellar bursa.  POSTOPERATIVE DIAGNOSIS:  Infected right prepatellar bursa.  OPERATION:   1.  Incision and drainage, right knee.   2.  Resection of prepatellar bursa.  SURGEON:  W D. Valeta Harms., MD.  ASSISTANTMarjo Bicker, PA.  ANESTHESIA:  General with adductor block.  TOURNIQUET TIME:  Approximately 30 minutes.  DESCRIPTION OF PROCEDURE:  Note in the record, this patient has been scheduled for an elective total knee, history of MRSA positivity of her nasal swab with additional diagnosis of diabetes and rheumatoid, presented in the holding area with an infected  going in the front of his knee, making it contraindicated due to the original plan for a total knee.  He was informed of this, thought it was his best interest to perform an I and D in the operative setting to obtain cultures.  He understands this  with the laser knee replacement for the foreseeable future.  Elevation of the leg inflation of thigh tourniquet 350.  Incision was made midline over the infected tourniquet which was excised, a lot of indurated tissue was encountered.  No gross abscess.  We sent some of this tissue for pathology as well as culture  and resected the bursa and irrigated it with pulsatile lavage.  We placed vancomycin powder into the wound.  This was not into the joint, however.  Closure was affected with nylon, placed in a very bulky dressing.  Tourniquet was released after  application of the dressing.     Elián.Darby D: 05/10/2022 9:43:49 am T: 05/10/2022 10:27:00 am  JOB: [id number]/ [id number]

## 2022-05-10 NOTE — Anesthesia Postprocedure Evaluation (Signed)
Anesthesia Post Note  Patient: Larry Welch  Procedure(s) Performed: INCISION AND DRAINAGE OF RIGHT KNEE BURSA, PREPATELLAR BURSECTOMY (Right: Knee)     Patient location during evaluation: PACU Anesthesia Type: General Level of consciousness: awake and alert Pain management: pain level controlled Vital Signs Assessment: post-procedure vital signs reviewed and stable Respiratory status: spontaneous breathing, nonlabored ventilation, respiratory function stable and patient connected to nasal cannula oxygen Cardiovascular status: blood pressure returned to baseline and stable Postop Assessment: no apparent nausea or vomiting Anesthetic complications: no  No notable events documented.  Last Vitals:  Vitals:   05/10/22 1130 05/10/22 1145  BP: 137/84 124/86  Pulse: (!) 57 61  Resp: 15 12  Temp:    SpO2: 100% 100%    Last Pain:  Vitals:   05/10/22 1145  TempSrc:   PainSc: 4                  Brizza Nathanson S

## 2022-05-11 ENCOUNTER — Encounter (HOSPITAL_COMMUNITY): Payer: Self-pay | Admitting: Orthopedic Surgery

## 2022-05-14 ENCOUNTER — Encounter (HOSPITAL_COMMUNITY): Admission: RE | Payer: Self-pay | Source: Ambulatory Visit

## 2022-05-14 ENCOUNTER — Ambulatory Visit (HOSPITAL_COMMUNITY): Admission: RE | Admit: 2022-05-14 | Payer: Medicare PPO | Source: Ambulatory Visit | Admitting: Cardiology

## 2022-05-14 SURGERY — LEFT HEART CATH AND CORONARY ANGIOGRAPHY
Anesthesia: LOCAL

## 2022-05-15 LAB — AEROBIC/ANAEROBIC CULTURE W GRAM STAIN (SURGICAL/DEEP WOUND)
Culture: NO GROWTH
Culture: NO GROWTH
Gram Stain: NONE SEEN
Gram Stain: NONE SEEN

## 2022-05-29 ENCOUNTER — Ambulatory Visit
Admission: EM | Admit: 2022-05-29 | Discharge: 2022-05-29 | Disposition: A | Discharge status: Home / Self Care | Payer: Medicare PPO | Attending: Emergency Medicine | Admitting: Emergency Medicine

## 2022-05-29 ENCOUNTER — Ambulatory Visit (INDEPENDENT_AMBULATORY_CARE_PROVIDER_SITE_OTHER): Payer: Medicare PPO

## 2022-05-29 DIAGNOSIS — S0990XA Unspecified injury of head, initial encounter: Secondary | ICD-10-CM

## 2022-05-29 DIAGNOSIS — M79642 Pain in left hand: Secondary | ICD-10-CM

## 2022-05-29 DIAGNOSIS — W19XXXA Unspecified fall, initial encounter: Secondary | ICD-10-CM | POA: Diagnosis not present

## 2022-05-29 DIAGNOSIS — S01111A Laceration without foreign body of right eyelid and periocular area, initial encounter: Secondary | ICD-10-CM | POA: Diagnosis not present

## 2022-05-29 DIAGNOSIS — Z23 Encounter for immunization: Secondary | ICD-10-CM | POA: Diagnosis not present

## 2022-05-29 DIAGNOSIS — M7989 Other specified soft tissue disorders: Secondary | ICD-10-CM | POA: Diagnosis not present

## 2022-05-29 MED ORDER — TETANUS-DIPHTH-ACELL PERTUSSIS 5-2.5-18.5 LF-MCG/0.5 IM SUSY
0.5000 mL | PREFILLED_SYRINGE | Freq: Once | INTRAMUSCULAR | Status: AC
  Administered 2022-05-29: 0.5 mL via INTRAMUSCULAR

## 2022-05-29 NOTE — ED Provider Notes (Signed)
Larry Welch    CSN: [id number] Arrival date & time: 05/29/22  1613      History   Chief Complaint Chief Complaint  Patient presents with   Laceration    HPI Larry Welch is a 76 y.o. male.  Patient presents with a laceration of his right eyebrow area after he fell down his steps at home this afternoon.  He also has swelling and pain of his left hand.  He states his knee "gave out" on him which caused his fall; approximately 15 steps.  He hit his head; No loss of consciousness.  He denies headache, dizziness, focal weakness, vision change, chest pain, shortness of breath, abdominal pain, or other symptoms.  Last tetanus unknown.  His medical history includes diabetes and hypertension.  He is on low dose aspirin daily.    The history is provided by the patient and medical records.    Past Medical History:  Diagnosis Date   Cancer [facility])    prostate   Diabetes mellitus    High cholesterol    Hypertension    Pneumonia    childhood    Patient Active Problem List   Diagnosis Date Noted   Type 2 diabetes mellitus without complication, without long-term current use of insulin ([place]) 03/22/2022   Mixed hyperlipidemia 03/22/2022   Primary hypertension 03/22/2022   Pre-operative clearance 03/22/2022   OSA (obstructive sleep apnea) 09/11/2012    Past Surgical History:  Procedure Laterality Date   BACK SURGERY  2009   FOOT SURGERY  19080s   Ganglion cyst inner right foot.   INCISION AND DRAINAGE Right 05/10/2022   Procedure: INCISION AND DRAINAGE OF RIGHT KNEE BURSA, PREPATELLAR BURSECTOMY;  Surgeon: Earlie Server, MD;  Location: [facility];  Service: Orthopedics;  Laterality: Right;   INSERTION PROSTATE RADIATION SEED         Home Medications    Prior to Admission medications   Medication Sig Start Date End Date Taking? Authorizing Provider  amLODipine-olmesartan (AZOR) 10-40 MG per tablet Take 1 tablet by mouth at bedtime.    [provider]  aspirin  EC 81 MG tablet Take 1 tablet (81 mg total) by mouth 2 (two) times daily. 05/10/22 06/09/22  Chadwell, Vonna Kotyk, PA-C  Choline Fenofibrate (FENOFIBRIC ACID) 135 MG CPDR Take 135 mg by mouth at bedtime.     [provider]  dorzolamide-timolol (COSOPT) 2-0.5 % ophthalmic solution Place 1 drop into both eyes 2 (two) times daily. 03/23/22   [provider]  doxycycline (ADOXA) 100 MG tablet Take 1 tablet (100 mg total) by mouth 2 (two) times daily. 05/10/22   Chadwell, Vonna Kotyk, PA-C  fluticasone (FLONASE) 50 MCG/ACT nasal spray Place 1 spray into both nostrils in the morning and at bedtime.    [provider]  folic acid (FOLVITE) 1 MG tablet Take 1 mg by mouth daily. 03/26/22   [provider]  HYDROcodone-acetaminophen (NORCO/VICODIN) 5-325 MG tablet Take 1 tablet by mouth every 4 (four) hours as needed for moderate pain. 05/10/22 05/10/23  Chadwell, Vonna Kotyk, PA-C  metFORMIN (GLUCOPHAGE) 1000 MG tablet Take 1,000 mg by mouth daily with breakfast.    [provider]  methotrexate (RHEUMATREX) 2.5 MG tablet Take 15 mg by mouth every Tuesday. 03/22/22   [provider]  rosuvastatin (CRESTOR) 20 MG tablet Take 20 mg by mouth at bedtime.    [provider]  sitaGLIPtin (JANUVIA) 100 MG tablet Take 100 mg by mouth at bedtime.    [provider]  solifenacin (VESICARE) 5 MG tablet Take 5 mg by mouth daily. 03/17/22   [provider]  spironolactone (ALDACTONE) 25 MG tablet Take 25 mg by mouth daily.    [provider]  valACYclovir (VALTREX) 1000 MG tablet Take 1,000 mg by mouth 2 (two) times daily. 04/12/22   [provider]    Family History Family History  Problem Relation Age of Onset   Cancer Father        Prostate and Bladder    Social History Social History   Tobacco Use   Smoking status: Former    Packs/day: 0.75    Years: 12.00    Total pack years: 9.00    Types: Cigarettes    Quit date:  06/10/1989    Years since quitting: 32.9   Smokeless tobacco: Never  Vaping Use   Vaping Use: Never used  Substance Use Topics   Alcohol use: Yes   Drug use: No     Allergies   Patient has no known allergies.   Review of Systems Review of Systems  Constitutional:  Negative for chills and fever.  Eyes:  Negative for visual disturbance.  Respiratory:  Negative for cough and shortness of breath.   Cardiovascular:  Negative for chest pain and palpitations.  Gastrointestinal:  Negative for abdominal pain, nausea and vomiting.  Musculoskeletal:  Positive for arthralgias and joint swelling. Negative for gait problem.  Skin:  Positive for wound. Negative for color change.  Neurological:  Negative for dizziness, seizures, syncope, facial asymmetry, speech difficulty, weakness, light-headedness, numbness and headaches.  All other systems reviewed and are negative.    Physical Exam Triage Vital Signs ED Triage Vitals  Enc Vitals Group     BP 05/29/22 1703 (!) 141/83     Pulse Rate 05/29/22 1658 62     Resp 05/29/22 1658 18     Temp 05/29/22 1658 98.1 F (36.7 C)     Temp src --      SpO2 05/29/22 1658 98 %     Weight 05/29/22 1701 180 lb (81.6 kg)     Height 05/29/22 1701 '5\' 10"'$  (1.778 m)     Head Circumference --      Peak Flow --      Pain Score 05/29/22 1658 4     Pain Loc --      Pain Edu? --      Excl. in [place]? --    No data found.  Updated Vital Signs BP (!) 141/83   Pulse 62   Temp 98.1 F (36.7 C)   Resp 18   Ht '5\' 10"'$  (1.778 m)   Wt 180 lb (81.6 kg)   SpO2 98%   BMI 25.83 kg/m   Visual Acuity Right Eye Distance:   Left Eye Distance:   Bilateral Distance:    Right Eye Near:   Left Eye Near:    Bilateral Near:     Physical Exam Vitals and nursing note reviewed.  Constitutional:      General: He is not in acute distress.    Appearance: Normal appearance. He is well-developed. He is not ill-appearing.  HENT:     Mouth/Throat:     Mouth: Mucous  membranes are moist.  Eyes:     Extraocular Movements: Extraocular movements intact.     Conjunctiva/sclera: Conjunctivae normal.     Pupils: Pupils are equal, round, and reactive to light.  Cardiovascular:     Rate and Rhythm: Normal rate and regular  rhythm.     Heart sounds: Normal heart sounds.  Pulmonary:     Effort: Pulmonary effort is normal. No respiratory distress.     Breath sounds: Normal breath sounds.  Abdominal:     General: Bowel sounds are normal.     Palpations: Abdomen is soft.     Tenderness: There is no abdominal tenderness. There is no guarding or rebound.  Musculoskeletal:        General: Swelling and tenderness present. No deformity. Normal range of motion.     Cervical back: Neck supple.  Skin:    General: Skin is warm and dry.     Capillary Refill: Capillary refill takes less than 2 seconds.     Findings: Lesion present.     Comments: 2 cm jagged laceration on right eyebrow. Bleeding controlled.   Neurological:     General: No focal deficit present.     Mental Status: He is alert and oriented to person, place, and time.     Sensory: No sensory deficit.     Motor: No weakness.     Gait: Gait normal.  Psychiatric:        Mood and Affect: Mood normal.        Behavior: Behavior normal.      UC Treatments / Results  Labs (all labs ordered are listed, but only abnormal results are displayed) Labs Reviewed - No data to display  EKG   Radiology DG Hand Complete Left  Result Date: 05/29/2022 CLINICAL DATA:  Pain and swelling status post fall. Fell down 15 steps. Left hand pain. EXAM: LEFT HAND - COMPLETE 3+ VIEW COMPARISON:  None Available. FINDINGS: There is diffuse decreased bone mineralization. Moderate triscaphe and mild thumb carpometacarpal joint space narrowing and subchondral sclerosis. Moderate thumb interphalangeal and fourth DIP joint space narrowing and peripheral osteophytosis. Moderate to severe third DIP joint space narrowing and  peripheral osteophytosis. Mild to moderate second and fifth DIP joint space narrowing and peripheral osteophytosis. Mild second through fourth metacarpophalangeal joint space narrowing. No acute fracture is seen.  No dislocation. IMPRESSION: 1. No acute fracture is seen. 2. Osteoarthritis as above, greatest within the DIP joints, thumb interphalangeal joint, and thumb carpometacarpal joint. Electronically Signed   By: Yvonne Kendall M.D.   On: 05/29/2022 17:52    Procedures Laceration Repair  Date/Time: 05/29/2022 5:53 PM  Performed by: Sharion Balloon, NP Authorized by: Sharion Balloon, NP   Consent:    Consent obtained:  Verbal   Consent given by:  Patient   Risks discussed:  Infection, pain, poor cosmetic result and poor wound healing Universal protocol:    Procedure explained and questions answered to patient or proxy's satisfaction: yes   Anesthesia:    Anesthesia method:  Local infiltration   Local anesthetic:  Lidocaine 1% w/o epi Laceration details:    Location:  Face   Face location:  R eyebrow   Length (cm):  2   Depth (mm):  2 Pre-procedure details:    Preparation:  Patient was prepped and draped in usual sterile fashion Exploration:    Hemostasis achieved with:  Direct pressure   Wound exploration: wound explored through full range of motion and entire depth of wound visualized   Treatment:    Area cleansed with:  Povidone-iodine   Amount of cleaning:  Standard   Irrigation solution:  Sterile saline   Irrigation method:  Syringe Skin repair:    Repair method:  Sutures   Suture size:  5-0  Suture material:  Nylon   Suture technique:  Simple interrupted   Number of sutures:  4 Repair type:    Repair type:  Simple Post-procedure details:    Dressing:  Antibiotic ointment and non-adherent dressing   Procedure completion:  Tolerated well, no immediate complications  (including critical care time)  Medications Ordered in UC Medications  Tdap (BOOSTRIX) injection 0.5  mL (0.5 mLs Intramuscular Given 05/29/22 1754)    Initial Impression / Assessment and Plan / UC Course  I have reviewed the triage vital signs and the nursing notes.  Pertinent labs & imaging results that were available during my care of the patient were reviewed by me and considered in my medical decision making (see chart for details).   Left hand pain and swelling, laceration of right eyebrow, fall, head injury.  4 sutures.  Tetanus updated today.  No loss of consciousness.  Patient declines transfer to the ED.  Wound care instructions and signs of infection discussed.  Instructed patient to return right away if he has signs of infection.  Education provided on laceration care, fall prevention in the home, head injury.  ED precautions discussed.  Instructed him to follow-up with his PCP.  Patient agrees to plan of care.  Final Clinical Impressions(s) / UC Diagnoses   Final diagnoses:  Left hand pain  Laceration of right eyebrow, initial encounter  Fall, initial encounter  Injury of head, initial encounter     Discharge Instructions      See the attached handout on head injury.  Go to the emergency department if you have concerning symptoms.    Your tetanus was updated today.    Your stitches need to be removed in 7 to 10 days.    See the attached information on laceration care.    See the attached information on fall prevention in the home.    Follow up with your primary care provider.        ED Prescriptions   None    PDMP not reviewed this encounter.   Sharion Balloon, NP 05/29/22 [phone]

## 2022-05-29 NOTE — ED Triage Notes (Addendum)
Patient to Urgent Care with complaints of laceration present to right eyebrow that occurred today. Bleeding well controlled at this time.   Patient reports that he is waiting on a knee replacement and his right knee gave away this afternoon and he fell down approx 15 steps then hit his head. Denies LOC. No blood thinner usage.   Unsure of last TDAP.

## 2022-05-29 NOTE — Discharge Instructions (Addendum)
See the attached handout on head injury.  Go to the emergency department if you have concerning symptoms.    Your tetanus was updated today.    Your stitches need to be removed in 7 to 10 days.    See the attached information on laceration care.    See the attached information on fall prevention in the home.    Follow up with your primary care provider.

## 2022-05-30 ENCOUNTER — Ambulatory Visit: Payer: Medicare PPO | Admitting: Internal Medicine

## 2022-06-06 ENCOUNTER — Ambulatory Visit
Admission: RE | Admit: 2022-06-06 | Discharge: 2022-06-06 | Disposition: A | Discharge status: Home / Self Care | Payer: Medicare PPO | Source: Ambulatory Visit | Attending: Internal Medicine | Admitting: Internal Medicine

## 2022-06-06 DIAGNOSIS — S01111A Laceration without foreign body of right eyelid and periocular area, initial encounter: Secondary | ICD-10-CM

## 2022-06-06 DIAGNOSIS — W19XXXA Unspecified fall, initial encounter: Secondary | ICD-10-CM | POA: Diagnosis not present

## 2022-06-06 NOTE — ED Notes (Addendum)
4 sutures removed from the right eyebrow , Pt. Tolerated well.

## 2022-06-06 NOTE — ED Notes (Addendum)
Patient here for suture removal for laceration on 05/29/2022

## 2022-06-13 DIAGNOSIS — M1711 Unilateral primary osteoarthritis, right knee: Secondary | ICD-10-CM | POA: Diagnosis not present

## 2022-07-05 DIAGNOSIS — M130 Polyarthritis, unspecified: Secondary | ICD-10-CM | POA: Diagnosis not present

## 2022-07-05 DIAGNOSIS — H918X1 Other specified hearing loss, right ear: Secondary | ICD-10-CM | POA: Diagnosis not present

## 2022-07-05 DIAGNOSIS — I10 Essential (primary) hypertension: Secondary | ICD-10-CM | POA: Diagnosis not present

## 2022-07-05 DIAGNOSIS — E1169 Type 2 diabetes mellitus with other specified complication: Secondary | ICD-10-CM | POA: Diagnosis not present

## 2022-07-29 DIAGNOSIS — M1711 Unilateral primary osteoarthritis, right knee: Secondary | ICD-10-CM | POA: Diagnosis not present

## 2022-07-30 NOTE — Progress Notes (Signed)
Surgery orders requested via Epic inbox. °

## 2022-08-02 NOTE — Progress Notes (Signed)
Second request for pre op orders in [facility]: Spoke with [employer]

## 2022-08-02 NOTE — Progress Notes (Signed)
Anesthesia Review:  PCP: Cardiologist : Chest x-ray : EKG : 03/22/22  Echo : 05/08/22  Stress test: 05/07/22  Cardiac Cath :  Activity level:  Sleep Study/ CPAP : Fasting Blood Sugar :      / Checks Blood Sugar -- times a day:   Blood Thinner/ Instructions /Last Dose: ASA / Instructions/ Last Dose :    DM- type  2  Hgba1c-

## 2022-08-05 ENCOUNTER — Ambulatory Visit: Payer: Self-pay | Admitting: Physician Assistant

## 2022-08-05 DIAGNOSIS — G8929 Other chronic pain: Secondary | ICD-10-CM

## 2022-08-05 NOTE — Patient Instructions (Signed)
SURGICAL WAITING ROOM VISITATION  Patients having surgery or a procedure may have no more than 2 support people in the waiting area - these visitors may rotate.    Children under the age of 46 must have an adult with them who is not the patient.  Due to an increase in RSV and influenza rates and associated hospitalizations, children ages 2 and under may not visit patients in [place].  If the patient needs to stay at the hospital during part of their recovery, the visitor guidelines for inpatient rooms apply. Pre-op nurse will coordinate an appropriate time for 1 support person to accompany patient in pre-op.  This support person may not rotate.    Please refer to the [facility] website for the visitor guidelines for Inpatients (after your surgery is over and you are in a regular room).       Your procedure is scheduled on:  08/19/22    Report to [facility] Main Entrance    Report to admitting at  [place] AM   Call this number if you have problems the morning of surgery [phone]   Do not eat food :After Midnight.   After Midnight you may have the following liquids until _ 0415_____ AM  DAY OF SURGERY  Water Non-Citrus Juices (without pulp, NO RED-Apple, White grape, White cranberry) Black Coffee (NO MILK/CREAM OR CREAMERS, sugar ok)  Clear Tea (NO MILK/CREAM OR CREAMERS, sugar ok) regular and decaf                             Plain Jell-O (NO RED)                                           Fruit ices (not with fruit pulp, NO RED)                                     Popsicles (NO RED)                                                               Sports drinks like Gatorade (NO RED)                    The day of surgery:  Drink ONE (1) Pre-Surgery Clear Ensure or G2 at   0415 AM ( have completed by )  the morning of surgery. Drink in one sitting. Do not sip.  This drink was given to you during your hospital  pre-op appointment visit. Nothing else to  drink after completing the  Pre-Surgery Clear Ensure or G2.          If you have questions, please contact your surgeon's office.   FOLLOW BOWEL PREP AND ANY ADDITIONAL PRE OP INSTRUCTIONS YOU RECEIVED FROM YOUR SURGEON'S OFFICE!!!     Oral Hygiene is also important to reduce your risk of infection.  Remember - BRUSH YOUR TEETH THE MORNING OF SURGERY WITH YOUR REGULAR TOOTHPASTE  DENTURES WILL BE REMOVED PRIOR TO SURGERY PLEASE DO NOT APPLY "Poly grip" OR ADHESIVES!!!   Do NOT smoke after Midnight   Take these medicines the morning of surgery with A SIP OF WATER:  eye drops as usual , flonase, vesicare   DO NOT TAKE ANY ORAL DIABETIC MEDICATIONS DAY OF YOUR SURGERY  Bring CPAP mask and tubing day of surgery.                              You may not have any metal on your body including hair pins, jewelry, and body piercing             Do not wear make-up, lotions, powders, perfumes/cologne, or deodorant  Do not wear nail polish including gel and S&S, artificial/acrylic nails, or any other type of covering on natural nails including finger and toenails. If you have artificial nails, gel coating, etc. that needs to be removed by a nail salon please have this removed prior to surgery or surgery may need to be canceled/ delayed if the surgeon/ anesthesia feels like they are unable to be safely monitored.   Do not shave  48 hours prior to surgery.               Men may shave face and neck.   Do not bring valuables to the hospital. [place].   Contacts, glasses, dentures or bridgework may not be worn into surgery.   Bring small overnight bag day of surgery.   DO NOT [place]. PHARMACY WILL DISPENSE MEDICATIONS LISTED ON YOUR MEDICATION LIST TO YOU DURING YOUR ADMISSION [place]!    Patients discharged on the day of surgery will not be allowed to drive home.   Someone NEEDS to stay with you for the first 24 hours after anesthesia.   Special Instructions: Bring a copy of your healthcare power of attorney and living will documents the day of surgery if you haven't scanned them before.              Please read over the following fact sheets you were given: IF [place] [phone]   If you received a COVID test during your pre-op visit  it is requested that you wear a mask when out in public, stay away from anyone that may not be feeling well and notify your surgeon if you develop symptoms. If you test positive for Covid or have been in contact with anyone that has tested positive in the last 10 days please notify you surgeon.    [place] - Preparing for Surgery Before surgery, you can play an important role.  Because skin is not sterile, your skin needs to be as free of germs as possible.  You can reduce the number of germs on your skin by washing with CHG (chlorahexidine gluconate) soap before surgery.  CHG is an antiseptic cleaner which kills germs and bonds with the skin to continue killing germs even after washing. Please DO NOT use if you have an allergy to CHG or antibacterial soaps.  If your skin becomes reddened/irritated stop using the CHG and inform your nurse when you arrive at Short Stay. Do not  shave (including legs and underarms) for at least 48 hours prior to the first CHG shower.  You may shave your face/neck. Please follow these instructions carefully:  1.  Shower with CHG Soap the night before surgery and the  morning of Surgery.  2.  If you choose to wash your hair, wash your hair first as usual with your  normal  shampoo.  3.  After you shampoo, rinse your hair and body thoroughly to remove the  shampoo.                           4.  Use CHG as you would any other liquid soap.  You can apply chg directly  to the skin and wash                       Gently with a scrungie or clean  washcloth.  5.  Apply the CHG Soap to your body ONLY FROM THE NECK DOWN.   Do not use on face/ open                           Wound or open sores. Avoid contact with eyes, ears mouth and genitals (private parts).                       Wash face,  Genitals (private parts) with your normal soap.             6.  Wash thoroughly, paying special attention to the area where your surgery  will be performed.  7.  Thoroughly rinse your body with warm water from the neck down.  8.  DO NOT shower/wash with your normal soap after using and rinsing off  the CHG Soap.                9.  Pat yourself dry with a clean towel.            10.  Wear clean pajamas.            11.  Place clean sheets on your bed the night of your first shower and do not  sleep with pets. Day of Surgery : Do not apply any lotions/deodorants the morning of surgery.  Please wear clean clothes to the hospital/surgery center.  FAILURE TO FOLLOW THESE INSTRUCTIONS MAY RESULT IN THE CANCELLATION OF YOUR SURGERY PATIENT SIGNATURE_________________________________  NURSE SIGNATURE__________________________________  ________________________________________________________________________

## 2022-08-06 ENCOUNTER — Other Ambulatory Visit: Payer: Self-pay

## 2022-08-06 ENCOUNTER — Encounter (HOSPITAL_COMMUNITY): Payer: Self-pay

## 2022-08-06 ENCOUNTER — Encounter (HOSPITAL_COMMUNITY)
Admission: RE | Admit: 2022-08-06 | Discharge: 2022-08-06 | Disposition: A | Discharge status: Home / Self Care | Payer: Medicare PPO | Source: Ambulatory Visit | Attending: Orthopedic Surgery | Admitting: Orthopedic Surgery

## 2022-08-06 VITALS — BP 129/81 | HR 53 | Temp 98.4°F | Resp 16 | Ht 69.0 in | Wt 187.0 lb

## 2022-08-06 DIAGNOSIS — G8929 Other chronic pain: Secondary | ICD-10-CM | POA: Diagnosis not present

## 2022-08-06 DIAGNOSIS — Z01812 Encounter for preprocedural laboratory examination: Secondary | ICD-10-CM | POA: Diagnosis not present

## 2022-08-06 DIAGNOSIS — E119 Type 2 diabetes mellitus without complications: Secondary | ICD-10-CM | POA: Insufficient documentation

## 2022-08-06 DIAGNOSIS — M25561 Pain in right knee: Secondary | ICD-10-CM | POA: Insufficient documentation

## 2022-08-06 DIAGNOSIS — Z01818 Encounter for other preprocedural examination: Secondary | ICD-10-CM

## 2022-08-06 HISTORY — DX: Unspecified osteoarthritis, unspecified site: M19.90

## 2022-08-06 LAB — COMPREHENSIVE METABOLIC PANEL
ALT: 15 U/L (ref 0–44)
AST: 19 U/L (ref 15–41)
Albumin: 4.1 g/dL (ref 3.5–5.0)
Alkaline Phosphatase: 32 U/L — ABNORMAL LOW (ref 38–126)
Anion gap: 6 (ref 5–15)
BUN: 16 mg/dL (ref 8–23)
CO2: 26 mmol/L (ref 22–32)
Calcium: 9.8 mg/dL (ref 8.9–10.3)
Chloride: 108 mmol/L (ref 98–111)
Creatinine, Ser: 1.09 mg/dL (ref 0.61–1.24)
GFR, Estimated: >60 mL/min (ref >60)
Glucose, Bld: 83 mg/dL (ref 70–99)
Potassium: 4.1 mmol/L (ref 3.5–5.1)
Sodium: 140 mmol/L (ref 135–145)
Total Bilirubin: 0.5 mg/dL (ref 0.3–1.2)
Total Protein: 7.1 g/dL (ref 6.5–8.1)

## 2022-08-06 LAB — CBC WITH DIFFERENTIAL/PLATELET
Abs Immature Granulocytes: 0 10*3/uL (ref 0.00–0.07)
Basophils Absolute: 0 10*3/uL (ref 0.0–0.1)
Basophils Relative: 1 %
Eosinophils Absolute: 0.1 10*3/uL (ref 0.0–0.5)
Eosinophils Relative: 2 %
HCT: 36.7 % — ABNORMAL LOW (ref 39.0–52.0)
Hemoglobin: 11.9 g/dL — ABNORMAL LOW (ref 13.0–17.0)
Immature Granulocytes: 0 %
Lymphocytes Relative: 23 %
Lymphs Abs: 1 10*3/uL (ref 0.7–4.0)
MCH: 32.9 pg (ref 26.0–34.0)
MCHC: 32.4 g/dL (ref 30.0–36.0)
MCV: 101.4 fL — ABNORMAL HIGH (ref 80.0–100.0)
Monocytes Absolute: 0.5 10*3/uL (ref 0.1–1.0)
Monocytes Relative: 13 %
Neutro Abs: 2.5 10*3/uL (ref 1.7–7.7)
Neutrophils Relative %: 61 %
Platelets: 277 10*3/uL (ref 150–400)
RBC: 3.62 MIL/uL — ABNORMAL LOW (ref 4.22–5.81)
RDW: 15 % (ref 11.5–15.5)
WBC: 4.1 10*3/uL (ref 4.0–10.5)
nRBC: 0 % (ref 0.0–0.2)

## 2022-08-06 LAB — TYPE AND SCREEN
ABO/RH(D): O POS
Antibody Screen: NEGATIVE

## 2022-08-06 LAB — SURGICAL PCR SCREEN
MRSA, PCR: NEGATIVE
Staphylococcus aureus: NEGATIVE

## 2022-08-06 LAB — GLUCOSE, CAPILLARY: Glucose-Capillary: 81 mg/dL (ref 70–99)

## 2022-08-07 ENCOUNTER — Ambulatory Visit: Payer: Self-pay | Admitting: Physician Assistant

## 2022-08-07 LAB — HEMOGLOBIN A1C
Hgb A1c MFr Bld: 5.8 % — ABNORMAL HIGH (ref 4.8–5.6)
Mean Plasma Glucose: 120 mg/dL

## 2022-08-07 NOTE — H&P (View-Only) (Signed)
TOTAL KNEE ADMISSION H&P  Patient is being admitted for right total knee arthroplasty.  Subjective:  Chief Complaint:right knee pain.  HPI: Larry Welch, 77 y.o. male, has a history of pain and functional disability in the right knee due to arthritis and has failed non-surgical conservative treatments for greater than 12 weeks to includeNSAID's and/or analgesics, corticosteriod injections, viscosupplementation injections, use of assistive devices, and activity modification.  Onset of symptoms was gradual, starting 5 years ago with gradually worsening course since that time. The patient noted no past surgery on the right knee(s).  Patient currently rates pain in the right knee(s) at 8 out of 10 with activity. Patient has night pain, worsening of pain with activity and weight bearing, pain that interferes with activities of daily living, pain with passive range of motion, crepitus, and joint swelling.  Patient has evidence of periarticular osteophytes and joint space narrowing by imaging studies. There is no active infection.  Patient Active Problem List   Diagnosis Date Noted  . Type 2 diabetes mellitus without complication, without long-term current use of insulin (HCC) 03/22/2022  . Mixed hyperlipidemia 03/22/2022  . Primary hypertension 03/22/2022  . Pre-operative clearance 03/22/2022  . OSA (obstructive sleep apnea) 09/11/2012   Past Medical History:  Diagnosis Date  . Arthritis   . Cancer (HCC)    prostate  . Diabetes mellitus   . High cholesterol   . Hypertension     Past Surgical History:  Procedure Laterality Date  . BACK SURGERY  2009  . FOOT SURGERY  19080s   Ganglion cyst inner right foot.  . INCISION AND DRAINAGE Right 05/10/2022   Procedure: INCISION AND DRAINAGE OF RIGHT KNEE BURSA, PREPATELLAR BURSECTOMY;  Surgeon: Caffrey, Daniel, MD;  Location: [facility];  Service: Orthopedics;  Laterality: Right;  . INSERTION PROSTATE RADIATION SEED      Current Outpatient  Medications  Medication Sig Dispense Refill Last Dose  . amLODipine-olmesartan (AZOR) 10-40 MG per tablet Take 1 tablet by mouth at bedtime.     . aspirin EC 81 MG tablet Take 81 mg by mouth every evening. Swallow whole.     . Choline Fenofibrate (FENOFIBRIC ACID) 135 MG CPDR Take 135 mg by mouth at bedtime.      . dorzolamide-timolol (COSOPT) 2-0.5 % ophthalmic solution Place 1 drop into both eyes 2 (two) times daily.     . doxycycline (ADOXA) 100 MG tablet Take 1 tablet (100 mg total) by mouth 2 (two) times daily. (Patient not taking: Reported on 08/01/2022) 30 tablet 0   . fluticasone (FLONASE) 50 MCG/ACT nasal spray Place 1 spray into both nostrils in the morning and at bedtime.     . folic acid (FOLVITE) 1 MG tablet Take 1 mg by mouth in the morning.     . HYDROcodone-acetaminophen (NORCO/VICODIN) 5-325 MG tablet Take 1 tablet by mouth every 4 (four) hours as needed for moderate pain. (Patient not taking: Reported on 08/01/2022) 30 tablet 0   . metFORMIN (GLUCOPHAGE) 1000 MG tablet Take 1,000 mg by mouth daily with breakfast.     . methotrexate (RHEUMATREX) 2.5 MG tablet Take 15 mg by mouth every Tuesday.     . Multiple Vitamin (MULTIVITAMIN WITH MINERALS) TABS tablet Take 1 tablet by mouth in the morning.     . rosuvastatin (CRESTOR) 20 MG tablet Take 20 mg by mouth in the morning.     . sitaGLIPtin (JANUVIA) 100 MG tablet Take 100 mg by mouth in the morning.     .   solifenacin (VESICARE) 5 MG tablet Take 5 mg by mouth in the morning.     . spironolactone (ALDACTONE) 25 MG tablet Take 25 mg by mouth daily.     . valACYclovir (VALTREX) 1000 MG tablet Take 1,000 mg by mouth 2 (two) times daily.      No current facility-administered medications for this visit.   No Known Allergies  Social History   Tobacco Use  . Smoking status: Former    Packs/day: 0.75    Years: 12.00    Total pack years: 9.00    Types: Cigarettes    Quit date: 06/10/1989    Years since quitting: 33.1  . Smokeless  tobacco: Never  Substance Use Topics  . Alcohol use: Yes    Comment: once per week    Family History  Problem Relation Age of Onset  . Cancer Father        Prostate and Bladder     Review of Systems  Musculoskeletal:  Positive for arthralgias.  All other systems reviewed and are negative.  Objective:  Physical Exam Constitutional:      General: He is not in acute distress.    Appearance: Normal appearance.  HENT:     Head: Normocephalic and atraumatic.  Eyes:     Extraocular Movements: Extraocular movements intact.     Pupils: Pupils are equal, round, and reactive to light.  Cardiovascular:     Rate and Rhythm: Normal rate and regular rhythm.     Pulses: Normal pulses.     Heart sounds: Normal heart sounds.  Pulmonary:     Effort: Pulmonary effort is normal. No respiratory distress.     Breath sounds: Normal breath sounds. No wheezing.  Abdominal:     General: Abdomen is flat. Bowel sounds are normal. There is no distension.     Palpations: Abdomen is soft.     Tenderness: There is no abdominal tenderness.  Musculoskeletal:     Cervical back: Normal range of motion and neck supple.     Comments: :  examination of the right lower extremity shows he is neurovascularly intact.  He has good range of motion with the right knee.  He does have joint line tenderness to palpation more so laterally.  Stable to varus and valgus stress.    Lymphadenopathy:     Cervical: No cervical adenopathy.  Skin:    General: Skin is warm and dry.     Findings: No erythema or rash.  Neurological:     General: No focal deficit present.     Mental Status: He is alert and oriented to person, place, and time.  Psychiatric:        Mood and Affect: Mood normal.        Behavior: Behavior normal.   Vital signs in last 24 hours: @VSRANGES@  Labs:   Estimated body mass index is 27.62 kg/m as calculated from the following:   Height as of 08/06/22: 5' 9" (1.753 m).   Weight as of 08/06/22: 84.8  kg.   Imaging Review Plain radiographs demonstrate moderate degenerative joint disease of the right knee(s). The overall alignment ismild valgus. The bone quality appears to be good for age and reported activity level.      Assessment/Plan:  End stage arthritis, right knee   The patient history, physical examination, clinical judgment of the provider and imaging studies are consistent with end stage degenerative joint disease of the right knee(s) and total knee arthroplasty is deemed medically necessary. The   treatment options including medical management, injection therapy arthroscopy and arthroplasty were discussed at length. The risks and benefits of total knee arthroplasty were presented and reviewed. The risks due to aseptic loosening, infection, stiffness, patella tracking problems, thromboembolic complications and other imponderables were discussed. The patient acknowledged the explanation, agreed to proceed with the plan and consent was signed. Patient is being admitted for inpatient treatment for surgery, pain control, PT, OT, prophylactic antibiotics, VTE prophylaxis, progressive ambulation and ADL's and discharge planning. The patient is planning to be discharged  home with outpt PT     Patient's anticipated LOS is less than 2 midnights, meeting these requirements: - Younger than 65 - Lives within 1 hour of care - Has a competent adult at home to recover with post-op recover - NO history of  - Chronic pain requiring opiods  - Diabetes  - Coronary Artery Disease  - Heart failure  - Heart attack  - Stroke  - DVT/VTE  - Cardiac arrhythmia  - Respiratory Failure/COPD  - Renal failure  - Anemia  - Advanced Liver disease   

## 2022-08-07 NOTE — H&P (Signed)
TOTAL KNEE ADMISSION H&P  Patient is being admitted for right total knee arthroplasty.  Subjective:  Chief Complaint:right knee pain.  HPI: Larry Welch, 77 y.o. male, has a history of pain and functional disability in the right knee due to arthritis and has failed non-surgical conservative treatments for greater than 12 weeks to includeNSAID's and/or analgesics, corticosteriod injections, viscosupplementation injections, use of assistive devices, and activity modification.  Onset of symptoms was gradual, starting 5 years ago with gradually worsening course since that time. The patient noted no past surgery on the right knee(s).  Patient currently rates pain in the right knee(s) at 8 out of 10 with activity. Patient has night pain, worsening of pain with activity and weight bearing, pain that interferes with activities of daily living, pain with passive range of motion, crepitus, and joint swelling.  Patient has evidence of periarticular osteophytes and joint space narrowing by imaging studies. There is no active infection.  Patient Active Problem List   Diagnosis Date Noted  . Type 2 diabetes mellitus without complication, without long-term current use of insulin ([place]) 03/22/2022  . Mixed hyperlipidemia 03/22/2022  . Primary hypertension 03/22/2022  . Pre-operative clearance 03/22/2022  . OSA (obstructive sleep apnea) 09/11/2012   Past Medical History:  Diagnosis Date  . Arthritis   . Cancer [facility])    prostate  . Diabetes mellitus   . High cholesterol   . Hypertension     Past Surgical History:  Procedure Laterality Date  . BACK SURGERY  2009  . FOOT SURGERY  19080s   Ganglion cyst inner right foot.  . INCISION AND DRAINAGE Right 05/10/2022   Procedure: INCISION AND DRAINAGE OF RIGHT KNEE BURSA, PREPATELLAR BURSECTOMY;  Surgeon: Earlie Server, MD;  Location: [facility];  Service: Orthopedics;  Laterality: Right;  . INSERTION PROSTATE RADIATION SEED      Current Outpatient  Medications  Medication Sig Dispense Refill Last Dose  . amLODipine-olmesartan (AZOR) 10-40 MG per tablet Take 1 tablet by mouth at bedtime.     Marland Kitchen aspirin EC 81 MG tablet Take 81 mg by mouth every evening. Swallow whole.     . Choline Fenofibrate (FENOFIBRIC ACID) 135 MG CPDR Take 135 mg by mouth at bedtime.      . dorzolamide-timolol (COSOPT) 2-0.5 % ophthalmic solution Place 1 drop into both eyes 2 (two) times daily.     Marland Kitchen doxycycline (ADOXA) 100 MG tablet Take 1 tablet (100 mg total) by mouth 2 (two) times daily. (Patient not taking: Reported on 08/01/2022) 30 tablet 0   . fluticasone (FLONASE) 50 MCG/ACT nasal spray Place 1 spray into both nostrils in the morning and at bedtime.     . folic acid (FOLVITE) 1 MG tablet Take 1 mg by mouth in the morning.     Marland Kitchen HYDROcodone-acetaminophen (NORCO/VICODIN) 5-325 MG tablet Take 1 tablet by mouth every 4 (four) hours as needed for moderate pain. (Patient not taking: Reported on 08/01/2022) 30 tablet 0   . metFORMIN (GLUCOPHAGE) 1000 MG tablet Take 1,000 mg by mouth daily with breakfast.     . methotrexate (RHEUMATREX) 2.5 MG tablet Take 15 mg by mouth every Tuesday.     . Multiple Vitamin (MULTIVITAMIN WITH MINERALS) TABS tablet Take 1 tablet by mouth in the morning.     . rosuvastatin (CRESTOR) 20 MG tablet Take 20 mg by mouth in the morning.     . sitaGLIPtin (JANUVIA) 100 MG tablet Take 100 mg by mouth in the morning.     Marland Kitchen  solifenacin (VESICARE) 5 MG tablet Take 5 mg by mouth in the morning.     Marland Kitchen spironolactone (ALDACTONE) 25 MG tablet Take 25 mg by mouth daily.     . valACYclovir (VALTREX) 1000 MG tablet Take 1,000 mg by mouth 2 (two) times daily.      No current facility-administered medications for this visit.   No Known Allergies  Social History   Tobacco Use  . Smoking status: Former    Packs/day: 0.75    Years: 12.00    Total pack years: 9.00    Types: Cigarettes    Quit date: 06/10/1989    Years since quitting: 33.1  . Smokeless  tobacco: Never  Substance Use Topics  . Alcohol use: Yes    Comment: once per week    Family History  Problem Relation Age of Onset  . Cancer Father        Prostate and Bladder     Review of Systems  Musculoskeletal:  Positive for arthralgias.  All other systems reviewed and are negative.  Objective:  Physical Exam Constitutional:      General: He is not in acute distress.    Appearance: Normal appearance.  HENT:     Head: Normocephalic and atraumatic.  Eyes:     Extraocular Movements: Extraocular movements intact.     Pupils: Pupils are equal, round, and reactive to light.  Cardiovascular:     Rate and Rhythm: Normal rate and regular rhythm.     Pulses: Normal pulses.     Heart sounds: Normal heart sounds.  Pulmonary:     Effort: Pulmonary effort is normal. No respiratory distress.     Breath sounds: Normal breath sounds. No wheezing.  Abdominal:     General: Abdomen is flat. Bowel sounds are normal. There is no distension.     Palpations: Abdomen is soft.     Tenderness: There is no abdominal tenderness.  Musculoskeletal:     Cervical back: Normal range of motion and neck supple.     Comments: :  examination of the right lower extremity shows he is neurovascularly intact.  He has good range of motion with the right knee.  He does have joint line tenderness to palpation more so laterally.  Stable to varus and valgus stress.    Lymphadenopathy:     Cervical: No cervical adenopathy.  Skin:    General: Skin is warm and dry.     Findings: No erythema or rash.  Neurological:     General: No focal deficit present.     Mental Status: He is alert and oriented to person, place, and time.  Psychiatric:        Mood and Affect: Mood normal.        Behavior: Behavior normal.   Vital signs in last 24 hours: '@VSRANGES'$ @  Labs:   Estimated body mass index is 27.62 kg/m as calculated from the following:   Height as of 08/06/22: '5\' 9"'$  (1.753 m).   Weight as of 08/06/22: 84.8  kg.   Imaging Review Plain radiographs demonstrate moderate degenerative joint disease of the right knee(s). The overall alignment ismild valgus. The bone quality appears to be good for age and reported activity level.      Assessment/Plan:  End stage arthritis, right knee   The patient history, physical examination, clinical judgment of the provider and imaging studies are consistent with end stage degenerative joint disease of the right knee(s) and total knee arthroplasty is deemed medically necessary. The  treatment options including medical management, injection therapy arthroscopy and arthroplasty were discussed at length. The risks and benefits of total knee arthroplasty were presented and reviewed. The risks due to aseptic loosening, infection, stiffness, patella tracking problems, thromboembolic complications and other imponderables were discussed. The patient acknowledged the explanation, agreed to proceed with the plan and consent was signed. Patient is being admitted for inpatient treatment for surgery, pain control, PT, OT, prophylactic antibiotics, VTE prophylaxis, progressive ambulation and ADL's and discharge planning. The patient is planning to be discharged  home with outpt PT     Patient's anticipated LOS is less than 2 midnights, meeting these requirements: - Younger than 56 - Lives within 1 hour of care - Has a competent adult at home to recover with post-op recover - NO history of  - Chronic pain requiring opiods  - Diabetes  - Coronary Artery Disease  - Heart failure  - Heart attack  - Stroke  - DVT/VTE  - Cardiac arrhythmia  - Respiratory Failure/COPD  - Renal failure  - Anemia  - Advanced Liver disease

## 2022-08-13 NOTE — Care Plan (Signed)
Ortho Bundle Case Management Note  Patient Details  Name: Larry Welch MRN: [id number] Date of Birth: August 10, 1945    spoke with patient. states he will discharge to home with family to assist. has a rolling walker for home use. OPPT set up with [facility]. discharge instructions given to him at H&P and he has no further questions. CPM has been ordered and will be delivered. Patient and MD in agreement with plan. Choice offered                  DME Arranged:  Walker rolling, CPM DME Agency:  [employer]  HH Arranged:    [place] Agency:     Additional Comments: Please contact me with any questions of if this plan should need to change.  Ladell Heads,  [facility]  [phone] 08/13/2022, 3:32 PM

## 2022-08-19 ENCOUNTER — Ambulatory Visit (HOSPITAL_COMMUNITY)
Admission: RE | Admit: 2022-08-19 | Discharge: 2022-08-19 | Disposition: A | Discharge status: Home / Self Care | Payer: Medicare PPO | Source: Ambulatory Visit | Attending: Orthopedic Surgery | Admitting: Orthopedic Surgery

## 2022-08-19 ENCOUNTER — Ambulatory Visit (HOSPITAL_BASED_OUTPATIENT_CLINIC_OR_DEPARTMENT_OTHER): Payer: Medicare PPO | Admitting: Anesthesiology

## 2022-08-19 ENCOUNTER — Encounter (HOSPITAL_COMMUNITY): Payer: Self-pay | Admitting: Orthopedic Surgery

## 2022-08-19 ENCOUNTER — Other Ambulatory Visit: Payer: Self-pay

## 2022-08-19 ENCOUNTER — Ambulatory Visit (HOSPITAL_COMMUNITY): Payer: Medicare PPO | Admitting: Physician Assistant

## 2022-08-19 ENCOUNTER — Ambulatory Visit (HOSPITAL_COMMUNITY): Payer: Medicare PPO

## 2022-08-19 ENCOUNTER — Encounter (HOSPITAL_COMMUNITY)
Admission: RE | Disposition: A | Discharge status: Home / Self Care | Payer: Self-pay | Source: Ambulatory Visit | Attending: Orthopedic Surgery

## 2022-08-19 DIAGNOSIS — Z87891 Personal history of nicotine dependence: Secondary | ICD-10-CM | POA: Insufficient documentation

## 2022-08-19 DIAGNOSIS — M1711 Unilateral primary osteoarthritis, right knee: Secondary | ICD-10-CM | POA: Insufficient documentation

## 2022-08-19 DIAGNOSIS — G8918 Other acute postprocedural pain: Secondary | ICD-10-CM | POA: Diagnosis not present

## 2022-08-19 DIAGNOSIS — G4733 Obstructive sleep apnea (adult) (pediatric): Secondary | ICD-10-CM | POA: Diagnosis not present

## 2022-08-19 DIAGNOSIS — Z7984 Long term (current) use of oral hypoglycemic drugs: Secondary | ICD-10-CM | POA: Diagnosis not present

## 2022-08-19 DIAGNOSIS — I10 Essential (primary) hypertension: Secondary | ICD-10-CM | POA: Insufficient documentation

## 2022-08-19 DIAGNOSIS — Z471 Aftercare following joint replacement surgery: Secondary | ICD-10-CM | POA: Diagnosis not present

## 2022-08-19 DIAGNOSIS — E119 Type 2 diabetes mellitus without complications: Secondary | ICD-10-CM

## 2022-08-19 DIAGNOSIS — I340 Nonrheumatic mitral (valve) insufficiency: Secondary | ICD-10-CM | POA: Insufficient documentation

## 2022-08-19 DIAGNOSIS — Z96651 Presence of right artificial knee joint: Secondary | ICD-10-CM | POA: Diagnosis not present

## 2022-08-19 DIAGNOSIS — E782 Mixed hyperlipidemia: Secondary | ICD-10-CM | POA: Insufficient documentation

## 2022-08-19 HISTORY — PX: TOTAL KNEE ARTHROPLASTY: SHX125

## 2022-08-19 LAB — GLUCOSE, CAPILLARY
Glucose-Capillary: 83 mg/dL (ref 70–99)
Glucose-Capillary: 86 mg/dL (ref 70–99)

## 2022-08-19 SURGERY — ARTHROPLASTY, KNEE, TOTAL
Anesthesia: Monitor Anesthesia Care | Site: Knee | Laterality: Right

## 2022-08-19 MED ORDER — OXYCODONE HCL 5 MG PO TABS: 5.0000 mg | ORAL_TABLET | ORAL | Status: DC | PRN

## 2022-08-19 MED ORDER — LACTATED RINGERS IV BOLUS
500.0000 mL | Freq: Once | INTRAVENOUS | Status: AC
  Administered 2022-08-19: 500 mL via INTRAVENOUS

## 2022-08-19 MED ORDER — OXYCODONE HCL 5 MG PO TABS
ORAL_TABLET | ORAL | Status: AC
  Filled 2022-08-19: qty 1

## 2022-08-19 MED ORDER — LACTATED RINGERS IV BOLUS
250.0000 mL | Freq: Once | INTRAVENOUS | Status: AC
  Administered 2022-08-19: 250 mL via INTRAVENOUS

## 2022-08-19 MED ORDER — SODIUM CHLORIDE 0.9 % IV SOLN: INTRAVENOUS | Status: DC

## 2022-08-19 MED ORDER — PROMETHAZINE HCL 25 MG/ML IJ SOLN: 6.2500 mg | INTRAMUSCULAR | Status: DC | PRN

## 2022-08-19 MED ORDER — ACETAMINOPHEN 500 MG PO TABS
1000.0000 mg | ORAL_TABLET | Freq: Once | ORAL | Status: AC
  Administered 2022-08-19: 1000 mg via ORAL
  Filled 2022-08-19: qty 2

## 2022-08-19 MED ORDER — PROPOFOL 10 MG/ML IV BOLUS
INTRAVENOUS | Status: DC | PRN
  Administered 2022-08-19: 20 mg via INTRAVENOUS

## 2022-08-19 MED ORDER — DEXAMETHASONE SODIUM PHOSPHATE 10 MG/ML IJ SOLN
INTRAMUSCULAR | Status: DC | PRN
  Administered 2022-08-19: 10 mg via INTRAVENOUS

## 2022-08-19 MED ORDER — DEXAMETHASONE SODIUM PHOSPHATE 10 MG/ML IJ SOLN
INTRAMUSCULAR | Status: DC | PRN
  Administered 2022-08-19: 5 mg

## 2022-08-19 MED ORDER — SODIUM CHLORIDE (PF) 0.9 % IJ SOLN
INTRAMUSCULAR | Status: AC
  Filled 2022-08-19: qty 10

## 2022-08-19 MED ORDER — BUPIVACAINE LIPOSOME 1.3 % IJ SUSP: 20.0000 mL | Freq: Once | INTRAMUSCULAR | Status: DC

## 2022-08-19 MED ORDER — ASPIRIN 81 MG PO TBEC: 81.0000 mg | DELAYED_RELEASE_TABLET | Freq: Two times a day (BID) | ORAL | 0 refills | Status: AC

## 2022-08-19 MED ORDER — METHOCARBAMOL 500 MG PO TABS: 500.0000 mg | ORAL_TABLET | Freq: Three times a day (TID) | ORAL | 0 refills | Status: AC | PRN

## 2022-08-19 MED ORDER — ACETAMINOPHEN 500 MG PO TABS: 1000.0000 mg | ORAL_TABLET | Freq: Three times a day (TID) | ORAL | 0 refills | Status: AC | PRN

## 2022-08-19 MED ORDER — ONDANSETRON HCL 4 MG PO TABS: 4.0000 mg | ORAL_TABLET | Freq: Three times a day (TID) | ORAL | 0 refills | Status: AC | PRN

## 2022-08-19 MED ORDER — PROPOFOL 10 MG/ML IV BOLUS
INTRAVENOUS | Status: AC
  Filled 2022-08-19: qty 20

## 2022-08-19 MED ORDER — 0.9 % SODIUM CHLORIDE (POUR BTL) OPTIME
TOPICAL | Status: DC | PRN
  Administered 2022-08-19: 1000 mL

## 2022-08-19 MED ORDER — ONDANSETRON HCL 4 MG/2ML IJ SOLN
INTRAMUSCULAR | Status: AC
  Filled 2022-08-19: qty 2

## 2022-08-19 MED ORDER — FENTANYL CITRATE (PF) 100 MCG/2ML IJ SOLN
INTRAMUSCULAR | Status: DC | PRN
  Administered 2022-08-19 (×2): 50 ug via INTRAVENOUS

## 2022-08-19 MED ORDER — CHLORHEXIDINE GLUCONATE 0.12 % MT SOLN
15.0000 mL | Freq: Once | OROMUCOSAL | Status: AC
  Administered 2022-08-19: 15 mL via OROMUCOSAL

## 2022-08-19 MED ORDER — EPHEDRINE SULFATE-NACL 50-0.9 MG/10ML-% IV SOSY
PREFILLED_SYRINGE | INTRAVENOUS | Status: DC | PRN
  Administered 2022-08-19: 5 mg via INTRAVENOUS

## 2022-08-19 MED ORDER — EPHEDRINE 5 MG/ML INJ
INTRAVENOUS | Status: AC
  Filled 2022-08-19: qty 5

## 2022-08-19 MED ORDER — PROPOFOL 500 MG/50ML IV EMUL
INTRAVENOUS | Status: DC | PRN
  Administered 2022-08-19: 50 ug/kg/min via INTRAVENOUS

## 2022-08-19 MED ORDER — GLYCOPYRROLATE 0.2 MG/ML IJ SOLN
INTRAMUSCULAR | Status: AC
  Filled 2022-08-19: qty 1

## 2022-08-19 MED ORDER — MIDAZOLAM HCL 5 MG/5ML IJ SOLN
INTRAMUSCULAR | Status: DC | PRN
  Administered 2022-08-19 (×2): 1 mg via INTRAVENOUS

## 2022-08-19 MED ORDER — PROPOFOL 1000 MG/100ML IV EMUL
INTRAVENOUS | Status: AC
  Filled 2022-08-19: qty 100

## 2022-08-19 MED ORDER — POVIDONE-IODINE 10 % EX SWAB
2.0000 | Freq: Once | CUTANEOUS | Status: AC
  Administered 2022-08-19: 2 via TOPICAL

## 2022-08-19 MED ORDER — ONDANSETRON HCL 4 MG/2ML IJ SOLN
INTRAMUSCULAR | Status: DC | PRN
  Administered 2022-08-19: 4 mg via INTRAVENOUS

## 2022-08-19 MED ORDER — LIDOCAINE HCL (PF) 2 % IJ SOLN
INTRAMUSCULAR | Status: AC
  Filled 2022-08-19: qty 5

## 2022-08-19 MED ORDER — MIDAZOLAM HCL 2 MG/2ML IJ SOLN
INTRAMUSCULAR | Status: AC
  Filled 2022-08-19: qty 2

## 2022-08-19 MED ORDER — BUPIVACAINE IN DEXTROSE 0.75-8.25 % IT SOLN
INTRATHECAL | Status: DC | PRN
  Administered 2022-08-19: 1.8 mL via INTRATHECAL

## 2022-08-19 MED ORDER — SODIUM CHLORIDE (PF) 0.9 % IJ SOLN
INTRAMUSCULAR | Status: DC | PRN
  Administered 2022-08-19: 60 mL

## 2022-08-19 MED ORDER — AMISULPRIDE (ANTIEMETIC) 5 MG/2ML IV SOLN: 10.0000 mg | Freq: Once | INTRAVENOUS | Status: DC | PRN

## 2022-08-19 MED ORDER — BUPIVACAINE LIPOSOME 1.3 % IJ SUSP
INTRAMUSCULAR | Status: DC | PRN
  Administered 2022-08-19: 20 mL

## 2022-08-19 MED ORDER — DEXAMETHASONE SODIUM PHOSPHATE 10 MG/ML IJ SOLN
INTRAMUSCULAR | Status: AC
  Filled 2022-08-19: qty 1

## 2022-08-19 MED ORDER — SODIUM CHLORIDE 0.9 % IR SOLN
Status: DC | PRN
  Administered 2022-08-19: 3000 mL

## 2022-08-19 MED ORDER — FENTANYL CITRATE (PF) 100 MCG/2ML IJ SOLN
INTRAMUSCULAR | Status: AC
  Filled 2022-08-19: qty 2

## 2022-08-19 MED ORDER — METHOCARBAMOL 500 MG IVPB - SIMPLE MED
500.0000 mg | Freq: Four times a day (QID) | INTRAVENOUS | Status: DC | PRN
  Administered 2022-08-19: 500 mg via INTRAVENOUS

## 2022-08-19 MED ORDER — METHOCARBAMOL 500 MG PO TABS: 500.0000 mg | ORAL_TABLET | Freq: Four times a day (QID) | ORAL | Status: DC | PRN

## 2022-08-19 MED ORDER — BUPIVACAINE LIPOSOME 1.3 % IJ SUSP
INTRAMUSCULAR | Status: AC
  Filled 2022-08-19: qty 20

## 2022-08-19 MED ORDER — METHOCARBAMOL 500 MG IVPB - SIMPLE MED
INTRAVENOUS | Status: AC
  Filled 2022-08-19: qty 55

## 2022-08-19 MED ORDER — CEFADROXIL 500 MG PO CAPS: 500.0000 mg | ORAL_CAPSULE | Freq: Two times a day (BID) | ORAL | 0 refills | Status: AC

## 2022-08-19 MED ORDER — FENTANYL CITRATE PF 50 MCG/ML IJ SOSY: 25.0000 ug | PREFILLED_SYRINGE | INTRAMUSCULAR | Status: DC | PRN

## 2022-08-19 MED ORDER — CEFAZOLIN SODIUM-DEXTROSE 2-4 GM/100ML-% IV SOLN
2.0000 g | INTRAVENOUS | Status: AC
  Administered 2022-08-19: 2 g via INTRAVENOUS
  Filled 2022-08-19: qty 100

## 2022-08-19 MED ORDER — WATER FOR IRRIGATION, STERILE IR SOLN
Status: DC | PRN
  Administered 2022-08-19: 2000 mL

## 2022-08-19 MED ORDER — TRANEXAMIC ACID-NACL 1000-0.7 MG/100ML-% IV SOLN
1000.0000 mg | INTRAVENOUS | Status: AC
  Administered 2022-08-19: 1000 mg via INTRAVENOUS
  Filled 2022-08-19: qty 100

## 2022-08-19 MED ORDER — OXYCODONE HCL 5 MG PO TABS: 5.0000 mg | ORAL_TABLET | ORAL | 0 refills | Status: AC | PRN

## 2022-08-19 MED ORDER — ISOPROPYL ALCOHOL 70 % SOLN
Status: DC | PRN
  Administered 2022-08-19: 1 via TOPICAL

## 2022-08-19 MED ORDER — LACTATED RINGERS IV SOLN: INTRAVENOUS | Status: DC

## 2022-08-19 MED ORDER — GLYCOPYRROLATE 0.2 MG/ML IJ SOLN
INTRAMUSCULAR | Status: DC | PRN
  Administered 2022-08-19: .1 mg via INTRAVENOUS

## 2022-08-19 MED ORDER — PHENYLEPHRINE HCL-NACL 20-0.9 MG/250ML-% IV SOLN
INTRAVENOUS | Status: DC | PRN
  Administered 2022-08-19: 25 ug/min via INTRAVENOUS

## 2022-08-19 MED ORDER — ROPIVACAINE HCL 7.5 MG/ML IJ SOLN
INTRAMUSCULAR | Status: DC | PRN
  Administered 2022-08-19: 20 mL via PERINEURAL

## 2022-08-19 MED ORDER — SODIUM CHLORIDE (PF) 0.9 % IJ SOLN
INTRAMUSCULAR | Status: AC
  Filled 2022-08-19: qty 50

## 2022-08-19 MED ORDER — CELECOXIB 100 MG PO CAPS: 100.0000 mg | ORAL_CAPSULE | Freq: Two times a day (BID) | ORAL | 0 refills | Status: AC

## 2022-08-19 SURGICAL SUPPLY — 72 items
ADH SKN CLS APL DERMABOND .7 (GAUZE/BANDAGES/DRESSINGS) ×1
APL PRP STRL LF DISP 70% ISPRP (MISCELLANEOUS) ×2
BAG COUNTER SPONGE SURGICOUNT (BAG) IMPLANT
BAG SPNG CNTER NS LX DISP (BAG) ×1
BLADE SAG 18X100X1.27 (BLADE) ×1 IMPLANT
BLADE SAW SAG 35X64 .89 (BLADE) ×1 IMPLANT
BNDG CMPR 5X3 CHSV STRCH STRL (GAUZE/BANDAGES/DRESSINGS) ×1
BNDG CMPR MED 10X6 ELC LF (GAUZE/BANDAGES/DRESSINGS) ×1
BNDG COHESIVE 3X5 TAN ST LF (GAUZE/BANDAGES/DRESSINGS) ×1 IMPLANT
BNDG ELASTIC 6X10 VLCR STRL LF (GAUZE/BANDAGES/DRESSINGS) ×1 IMPLANT
BOWL SMART MIX CTS (DISPOSABLE) ×1 IMPLANT
BSPLAT TIB 5D G CMNT STM RT (Knees) ×1 IMPLANT
CEMENT BONE REFOBACIN R1X40 US (Cement) IMPLANT
CHLORAPREP W/TINT 26 (MISCELLANEOUS) ×2 IMPLANT
CLSR STERI-STRIP ANTIMIC 1/2X4 (GAUZE/BANDAGES/DRESSINGS) IMPLANT
COMP FEM CMT PERSONA SZ10 RT (Joint) ×1 IMPLANT
COMP PATELLAR 35 STD 9 THK (Orthopedic Implant) IMPLANT
COMPONENT FEM CMT PRNSA SZ10RT (Joint) IMPLANT
COVER SURGICAL LIGHT HANDLE (MISCELLANEOUS) ×1 IMPLANT
CUFF TOURN SGL QUICK 34 (TOURNIQUET CUFF) ×1
CUFF TRNQT CYL 34X4.125X (TOURNIQUET CUFF) ×1 IMPLANT
DERMABOND ADVANCED .7 DNX12 (GAUZE/BANDAGES/DRESSINGS) ×1 IMPLANT
DRAPE INCISE IOBAN 85X60 (DRAPES) ×1 IMPLANT
DRAPE SHEET LG 3/4 BI-LAMINATE (DRAPES) ×1 IMPLANT
DRAPE U-SHAPE 47X51 STRL (DRAPES) ×1 IMPLANT
DRESSING AQUACEL AG SP 3.5X10 (GAUZE/BANDAGES/DRESSINGS) ×1 IMPLANT
DRESSING PEEL AND PLAC PRVNA20 (GAUZE/BANDAGES/DRESSINGS) IMPLANT
DRSG AQUACEL AG ADV 3.5X10 (GAUZE/BANDAGES/DRESSINGS) IMPLANT
DRSG AQUACEL AG SP 3.5X10 (GAUZE/BANDAGES/DRESSINGS) ×1
DRSG PEEL AND PLACE PREVENA 20 (GAUZE/BANDAGES/DRESSINGS) ×1
ELECT REM PT RETURN 15FT ADLT (MISCELLANEOUS) ×1 IMPLANT
GAUZE SPONGE 4X4 12PLY STRL (GAUZE/BANDAGES/DRESSINGS) ×1 IMPLANT
GLOVE BIO SURGEON STRL SZ 6.5 (GLOVE) ×2 IMPLANT
GLOVE BIOGEL PI IND STRL 6.5 (GLOVE) ×1 IMPLANT
GLOVE BIOGEL PI IND STRL 8 (GLOVE) ×1 IMPLANT
GLOVE SURG ORTHO 8.0 STRL STRW (GLOVE) ×2 IMPLANT
GOWN STRL REUS W/ TWL XL LVL3 (GOWN DISPOSABLE) ×2 IMPLANT
GOWN STRL REUS W/TWL XL LVL3 (GOWN DISPOSABLE) ×2
HANDPIECE INTERPULSE COAX TIP (DISPOSABLE) ×1
HDLS TROCR DRIL PIN KNEE 75 (PIN) ×1
HOLDER FOLEY CATH W/STRAP (MISCELLANEOUS) ×1 IMPLANT
HOOD PEEL AWAY T7 (MISCELLANEOUS) ×3 IMPLANT
INSERT TIBIAL PERSONA 10 RT (Insert) IMPLANT
KIT DRSG PREVENA PLUS 7DAY 125 (MISCELLANEOUS) IMPLANT
KIT TURNOVER KIT A (KITS) IMPLANT
MANIFOLD NEPTUNE II (INSTRUMENTS) ×1 IMPLANT
MARKER SKIN DUAL TIP RULER LAB (MISCELLANEOUS) ×1 IMPLANT
NS IRRIG 1000ML POUR BTL (IV SOLUTION) ×1 IMPLANT
PACK TOTAL KNEE CUSTOM (KITS) ×1 IMPLANT
PATELLA ZIMMER 35MM (Orthopedic Implant) ×1 IMPLANT
PIN DRILL HDLS TROCAR 75 4PK (PIN) IMPLANT
PROTECTOR NERVE ULNAR (MISCELLANEOUS) ×1 IMPLANT
SCREW HEADED 33MM KNEE (MISCELLANEOUS) IMPLANT
SET HNDPC FAN SPRY TIP SCT (DISPOSABLE) ×1 IMPLANT
SOLUTION IRRIG SURGIPHOR (IV SOLUTION) IMPLANT
SPIKE FLUID TRANSFER (MISCELLANEOUS) ×1 IMPLANT
STEM TIBIA 5 DEG SZ G R KNEE (Knees) IMPLANT
STRIP CLOSURE SKIN 1/2X4 (GAUZE/BANDAGES/DRESSINGS) ×1 IMPLANT
SUT MNCRL AB 3-0 PS2 18 (SUTURE) ×1 IMPLANT
SUT STRATAFIX 0 PDS 27 VIOLET (SUTURE) ×1
SUT STRATAFIX PDO 1 14 VIOLET (SUTURE) ×1
SUT STRATFX PDO 1 14 VIOLET (SUTURE) ×1
SUT VIC AB 2-0 CT2 27 (SUTURE) ×2 IMPLANT
SUTURE STRATFX 0 PDS 27 VIOLET (SUTURE) ×1 IMPLANT
SUTURE STRATFX PDO 1 14 VIOLET (SUTURE) ×1 IMPLANT
SYR 50ML LL SCALE MARK (SYRINGE) ×1 IMPLANT
TIBIA STEM 5 DEG SZ G R KNEE (Knees) ×1 IMPLANT
TIBIAL INSERT PERSONA 10 RT (Insert) ×1 IMPLANT
TRAY FOLEY MTR SLVR 14FR STAT (SET/KITS/TRAYS/PACK) IMPLANT
TUBE SUCTION HIGH CAP CLEAR NV (SUCTIONS) ×1 IMPLANT
UNDERPAD 30X36 HEAVY ABSORB (UNDERPADS AND DIAPERS) ×1 IMPLANT
WRAP KNEE MAXI GEL POST OP (GAUZE/BANDAGES/DRESSINGS) IMPLANT

## 2022-08-19 NOTE — Progress Notes (Signed)
Orthopedic Tech Progress Note Patient Details:  Larry Welch 05-14-1946 [id number]  Ortho Devices Type of Ortho Device: Bone foam zero knee Ortho Device/Splint Interventions: Ordered      [place] 08/19/2022, 10:46 AM

## 2022-08-19 NOTE — Anesthesia Procedure Notes (Signed)
Anesthesia Regional Block: Adductor canal block   Pre-Anesthetic Checklist: , timeout performed,  Correct Patient, Correct Site, Correct Laterality,  Correct Procedure, Correct Position, site marked,  Risks and benefits discussed,  Surgical consent,  Pre-op evaluation,  At surgeon's request and post-op pain management  Laterality: Right  Prep: chloraprep       Needles:  Injection technique: Single-shot  Needle Type: Stimulator Needle - 80     Needle Length: 10cm  Needle Gauge: 21     Additional Needles:   Narrative:  Start time: 08/19/2022 6:53 AM End time: 08/19/2022 7:03 AM Injection made incrementally with aspirations every 5 mL.  Performed by: Personally  Anesthesiologist: Duane Boston, MD

## 2022-08-19 NOTE — Transfer of Care (Signed)
Immediate Anesthesia Transfer of Care Note  Patient: Larry Welch  Procedure(s) Performed: TOTAL KNEE ARTHROPLASTY (Right: Knee)  Patient Location: PACU  Anesthesia Type:Spinal  Level of Consciousness: awake, alert , and oriented  Airway & Oxygen Therapy: Patient Spontanous Breathing and Patient connected to face mask oxygen  Post-op Assessment: Report given to RN and Post -op Vital signs reviewed and stable  Post vital signs: Reviewed and stable  Last Vitals:  Vitals Value Taken Time  BP 110/67 08/19/22 1003  Temp    Pulse 57 08/19/22 1005  Resp 14 08/19/22 1005  SpO2 100 % 08/19/22 1005  Vitals shown include unvalidated device data.  Last Pain:  Vitals:   08/19/22 0615  TempSrc:   PainSc: 0-No pain         Complications: No notable events documented.

## 2022-08-19 NOTE — Evaluation (Signed)
Physical Therapy Evaluation Patient Details Name: Larry Welch MRN: [id number] DOB: Jun 29, 1945 Today's Date: 08/19/2022  History of Present Illness  77 yo male presents to therapy s/p R TKA on 08/19/2022 due to failure of conservative measures. Pt PMH includes but is not limited to: DM II, HTN, HDL, OSA, prostate CA s/p radiation and I and D of R knee bursa (2023).  Clinical Impression    Larry Welch is a 77 y.o. male POD 0 s/p R TKA. Patient reports mod I with mobility at baseline. Patient is now limited by functional impairments (see PT problem list below) and requires min guard for transfers and gait with RW. Patient was able to ambulate 45 and 40 feet with RW and min guard and cues for safe walker management. Patient educated on safe sequencing for stair mobility, pain management and car transfers and verbalized safe guarding position for people assisting with mobility. Patient instructed in exercises to facilitate ROM and circulation reviewed with HO provided. Pt indicates 3/10 R knee pain and no increased pain reported with mobility. Pt had episode of urinary incontinent with gait tasks and PT assisted pt with doffing B gripper socks, R ACE bandage and B thigh high compression stocking and assisted with donning, PT communicated with nursing staff. Pt has wound vac in place.  Patient will benefit from continued skilled PT interventions to address impairments and progress towards PLOF. Patient has met mobility goals at adequate level for discharge home with family support and OPPT anticipated to start 3/13; will continue to follow if pt continues acute stay to progress towards Mod I goals.      Recommendations for follow up therapy are one component of a multi-disciplinary discharge planning process, led by the attending physician.  Recommendations may be updated based on patient status, additional functional criteria and insurance authorization.  Follow Up Recommendations Outpatient PT  (anticipate OPPT to start 3/13)      Assistance Recommended at Discharge Intermittent Supervision/Assistance  Patient can return home with the following       Equipment Recommendations None recommended by PT (pt has DME in home setting)  Recommendations for Other Services       Functional Status Assessment Patient has had a recent decline in their functional status and demonstrates the ability to make significant improvements in function in a reasonable and predictable amount of time.     Precautions / Restrictions Precautions Precautions: Knee;Fall, R LE wound vac Restrictions Weight Bearing Restrictions: No      Mobility  Bed Mobility Overal bed mobility: Needs Assistance Bed Mobility: Supine to Sit     Supine to sit: Min guard     General bed mobility comments: cues for sequening and use of L LE to slide R LE to EOB    Transfers Overall transfer level: Needs assistance Equipment used: Rolling walker (2 wheels) Transfers: Sit to/from Stand Sit to Stand: Min guard           General transfer comment: cues for proper UE and R LE placement for commode, recliner and bed transfers    Ambulation/Gait Ambulation/Gait assistance: Min guard Gait Distance (Feet): 45 Feet Assistive device: Rolling walker (2 wheels) Gait Pattern/deviations: Step-to pattern, Antalgic, Trunk flexed Gait velocity: decreased     General Gait Details: episode of urinary incont. with gait  Stairs Stairs: Yes Stairs assistance: Min guard Stair Management: One rail Left Number of Stairs: 2 General stair comments: pt able to perform step to pattern with min cues  Wheelchair  Mobility    Modified Rankin (Stroke Patients Only)       Balance                                             Pertinent Vitals/Pain Pain Assessment Pain Assessment: 0-10 Pain Score: 3  Pain Location: R Knee Pain Descriptors / Indicators: Aching, Discomfort, Operative site  guarding Pain Intervention(s): Limited activity within patient's tolerance, Monitored during session    Home Living Family/patient expects to be discharged to:: Private residence Living Arrangements: Spouse/significant other Available Help at Discharge: Family;Available 24 hours/day Type of Home: House Home Access: Stairs to enter Entrance Stairs-Rails: Left Entrance Stairs-Number of Steps: 7   Home Layout: Two level;Able to live on main level with bedroom/bathroom Home Equipment: Conservation officer, nature (2 wheels)      Prior Function Prior Level of Function : Independent/Modified Independent             Mobility Comments: MOD I with ADLs, self care tasks, IADLs, and driving No AD       Hand Dominance        Extremity/Trunk Assessment        Lower Extremity Assessment Lower Extremity Assessment: RLE deficits/detail RLE Deficits / Details: DF/PF 5/5 RLE Sensation: WNL       Communication   Communication: No difficulties  Cognition Arousal/Alertness: Awake/alert Behavior During Therapy: WFL for tasks assessed/performed Overall Cognitive Status: Within Functional Limits for tasks assessed                                          General Comments      Exercises Total Joint Exercises Ankle Circles/Pumps: AROM, Both, 20 reps Quad Sets: AROM, Right, 5 reps Short Arc Quad: AROM, Right, 5 reps Heel Slides: AROM, Right, 5 reps Hip ABduction/ADduction: AROM, Right, 5 reps Straight Leg Raises: AROM, Right, 5 reps   Assessment/Plan    PT Assessment Patient needs continued PT services  PT Problem List Decreased strength;Decreased range of motion;Decreased activity tolerance;Decreased balance;Decreased mobility;Pain       PT Treatment Interventions DME instruction;Gait training;Stair training;Functional mobility training;Therapeutic activities;Therapeutic exercise;Balance training;Neuromuscular re-education;Patient/family education;Modalities    PT  Goals (Current goals can be found in the Care Plan section)  Acute Rehab PT Goals Patient Stated Goal: to be able to walk PT Goal Formulation: With patient Time For Goal Achievement: 09/02/22 Potential to Achieve Goals: Good    Frequency       Co-evaluation               AM-PAC PT "6 Clicks" Mobility  Outcome Measure Help needed turning from your back to your side while in a flat bed without using bedrails?: A Little Help needed moving from lying on your back to sitting on the side of a flat bed without using bedrails?: A Little Help needed moving to and from a bed to a chair (including a wheelchair)?: A Little Help needed standing up from a chair using your arms (e.g., wheelchair or bedside chair)?: A Little Help needed to walk in hospital room?: A Little Help needed climbing 3-5 steps with a railing? : A Little 6 Click Score: 18    End of Session Equipment Utilized During Treatment: Gait belt Activity Tolerance: Patient tolerated treatment well;No increased pain Patient  left: in chair;with call bell/phone within reach Nurse Communication: Mobility status;Other (comment) (episode of urinary incont) PT Visit Diagnosis: Unsteadiness on feet (R26.81);Other abnormalities of gait and mobility (R26.89);Muscle weakness (generalized) (M62.81);Pain Pain - Right/Left: Right Pain - part of body: Knee    Time: [id number] PT Time Calculation (min) (ACUTE ONLY): 66 min   Charges:   PT Evaluation $PT Eval Low Complexity: 1 Low PT Treatments $Gait Training: 8-22 mins $Therapeutic Exercise: 8-22 mins $Therapeutic Activity: 8-22 mins       Baird Lyons, PT   Adair Patter 08/19/2022, 3:29 PM

## 2022-08-19 NOTE — Op Note (Signed)
DATE OF SURGERY:  08/19/2022 TIME: 9:29 AM  PATIENT NAME:  Larry Welch   AGE: 77 y.o.   PRE-OPERATIVE DIAGNOSIS:  End-stage right knee osteoarthritis  POST-OPERATIVE DIAGNOSIS:  Same  PROCEDURE:  Right Total Knee Arthroplasty  SURGEON:  Reggie Bise A Estefan Pattison, MD   ASSISTANT: Dorise Bullion, PA-C, present and scrubbed throughout the case, critical for assistance with exposure, retraction, instrumentation, and closure.   OPERATIVE IMPLANTS:  Zimmer persona cemented size 10 standard femur, G tibial baseplate, 35 mm patella all poly, 10 mm MC poly insert Implant Name Type Inv. Item Serial No. Manufacturer Lot No. LRB No. Used Action  CEMENT BONE REFOBACIN R1X40 [place] - [id number] Cement CEMENT BONE REFOBACIN R1X40 [place]  ZIMMER RECON(ORTH,TRAU,BIO,SG) [id number] Right 1 Implanted  CEMENT BONE REFOBACIN R1X40 [place] - [id number] Cement CEMENT BONE REFOBACIN R1X40 [place]  ZIMMER RECON(ORTH,TRAU,BIO,SG) [id number] Right 1 Implanted  TIBIA STEM 5 DEG SZ G R KNEE - [id number] Knees TIBIA STEM 5 DEG SZ G R KNEE  ZIMMER RECON(ORTH,TRAU,BIO,SG) [id number] Right 1 Implanted  PATELLA ZIMMER 35MM - [id number] Orthopedic Implant PATELLA ZIMMER 35MM  ZIMMER RECON(ORTH,TRAU,BIO,SG) [id number] Right 1 Implanted  COMP FEM CMT PERSONA SZ10 RT - [id number] Joint COMP FEM CMT PERSONA SZ10 RT  ZIMMER RECON(ORTH,TRAU,BIO,SG) [id number] Right 1 Implanted  TIBIAL INSERT PERSONA 10 RT - [id number] Insert TIBIAL INSERT PERSONA 10 RT  ZIMMER RECON(ORTH,TRAU,BIO,SG) [id number] Right 1 Implanted      PREOPERATIVE INDICATIONS:  Larry Welch is a 77 y.o. year old male with end stage bone on bone degenerative arthritis of the knee who failed conservative treatment, including injections, antiinflammatories, activity modification, and assistive devices, and had significant impairment of their activities of daily living, and elected for Total Knee Arthroplasty.   The risks, benefits, and alternatives were discussed at length including but  not limited to the risks of infection, bleeding, nerve injury, stiffness, blood clots, the need for revision surgery, cardiopulmonary complications, among others, and they were willing to proceed.  OPERATIVE FINDINGS AND UNIQUE ASPECTS OF THE CASE: Well-healed burst from prior bursectomy by Dr. French Ana in December.  No signs of infection at this point.  ESTIMATED BLOOD LOSS: 50cc  OPERATIVE DESCRIPTION:   Once adequate anesthesia was induced, preoperative antibiotics, 2 gm of ancef,1 gm of Tranexamic Acid, and 8 mg of Decadron administered, the patient was positioned supine with a right thigh tourniquet placed.  The right lower extremity was prepped and draped in sterile fashion.  A time-  out was performed identifying the patient, planned procedure, and the appropriate extremity.     The leg was  exsanguinated, tourniquet elevated to 250 mmHg.  A midline incision was  made followed by median parapatellar arthrotomy. Anterior horn of the medial meniscus was released and resected. A medial release was performed, the infrapatellar fat pad was resected with care taken to protect the patellar tendon. The suprapatellar fat was removed to exposed the distal anterior femur. The anterior horn of the lateral meniscus and ACL were released.    Following initial  exposure, I first started with the femur  The femoral  canal was opened with a drill, canal was suctioned to try to prevent fat emboli.  An  intramedullary rod was passed set at 6 degrees valgus, 10 mm. The distal femur was resected.  Following this resection, the tibia was  subluxated anteriorly.  Using the extramedullary guide, 10 mm of bone was resected off   the proximal lateral tibia.  We confirmed the gap would be  stable  medially and laterally with a size 41m spacer block as well as confirmed that the tibial cut was perpendicular in the coronal plane, checking with an alignment rod.    Once this was done, the posterior femoral referencing  femoral sizer was placed under to the posterior condyles with 5 degrees of external rotational which was parallel to the transepicondylar axis and perpendicular to [employer] The femur was sized to be a size 10 in the anterior-  posterior dimension. The  anterior, posterior, and  chamfer cuts were made without difficulty nor   notching making certain that I was along the anterior cortex to help  with flexion gap stability. Next a laminar spreader was placed with the knee in flexion and the medial lateral menisci were resected.  5 cc of the Exparel mixture was injected in the medial side of the back of the knee and 3 cc in the lateral side.  1/2 inch curved osteotome was used to resect posterior osteophyte that was then removed with a pituitary rongeur.       At this point, the tibia was sized to be a size G.  The size G tray was  then pinned in position. Trial reduction was now carried with a 10 femur, G tibia, a 10 mm MC insert.  The knee had full extension and was stable to varus valgus stress in extension.  The knee was stable in flexion and PCL was left intact.  Attention was next directed to the patella.  Precut  measurement was noted to be 26 mm.  I resected down to 15 mm and used a  336mpatellar button to restore patellar height as well as cover the cut surface.     The patella lug holes were drilled and a 3547matella poly trial was placed.    The knee was brought to full extension with good flexion stability with the patella tracking through the trochlea without application of pressure.     Next the femoral component was again assessed and determined to be seated and appropriately lateralized.  The femoral lug holes were drilled.  The femoral component was then removed. Tibial component was again assessed and felt to be seated and appropriately rotated with the medial third of the tubercle. The tibia was then drilled, and keel punched.     Final components were  opened and antibiotic  cement was mixed.      Final implants were then  cemented onto cleaned and dried cut surfaces of bone with the knee brought to extension with a 10 mm MC poly.  The knee was irrigated with sterile Betadine diluted in saline as well as pulse lavage normal saline. The synovial lining was  then injected a dilute Exparel.      Once the cement had fully cured, excess cement was removed throughout the knee.  I confirmed that I was satisfied with the range of motion and stability, and the final 48m99m poly insert was chosen.  It was placed into the knee.         The tourniquet had been let down at 67 minutes.  No significant hemostasis was required.  The medial parapatellar arthrotomy was then reapproximated using #1 Stratafix sutures with the knee  in flexion.  The remaining wound was closed with 0 stratafix, 2-0 Vicryl, and running 3-0 Nylon. The knee was cleaned, dried, dressed sterilely using Prevena wound vac dressing.  The patient was then brought to recovery room in stable condition, tolerating the  procedure  well. There were no complications.   Post op recs: WB: WBAT Abx: ancef,dc on 7 days of cefadroxil 500 twice daily given history of bursectomy in December Imaging: PACU xrays DVT prophylaxis: Aspirin '81mg'$  BID x4 weeks Follow up: 1 week after surgery for a wound check with Dr. Zachery Dakins at [facility].  Address: [address], [place], [place] [address]  Office Phone: [phone]  Charlies Constable, MD Orthopaedic Surgery

## 2022-08-19 NOTE — Anesthesia Procedure Notes (Signed)
Spinal  Patient location during procedure: OR Start time: 08/19/2022 7:18 AM End time: 08/19/2022 7:28 AM Reason for block: surgical anesthesia Staffing Performed: anesthesiologist  Anesthesiologist: Duane Boston, MD Performed by: Duane Boston, MD Authorized by: Duane Boston, MD   Preanesthetic Checklist Completed: patient identified, IV checked, risks and benefits discussed, surgical consent, monitors and equipment checked, pre-op evaluation and timeout performed Spinal Block Patient position: sitting Prep: DuraPrep Patient monitoring: cardiac monitor, continuous pulse ox and blood pressure Approach: midline Location: L2-3 Injection technique: single-shot Needle Needle type: Pencan  Needle gauge: 24 G Needle length: 9 cm Assessment Events: CSF return Additional Notes Functioning IV was confirmed and monitors were applied. Sterile prep and drape, including hand hygiene and sterile gloves were used. The patient was positioned and the spine was prepped. The skin was anesthetized with lidocaine.  Free flow of clear CSF was obtained prior to injecting local anesthetic into the CSF.  The spinal needle aspirated freely following injection.  The needle was carefully withdrawn.  The patient tolerated the procedure well.

## 2022-08-19 NOTE — Anesthesia Procedure Notes (Signed)
Date/Time: 08/19/2022 7:25 AM  Performed by: Sharlette Dense, CRNAOxygen Delivery Method: Simple face mask

## 2022-08-19 NOTE — Anesthesia Postprocedure Evaluation (Signed)
Anesthesia Post Note  Patient: Larry Welch  Procedure(s) Performed: TOTAL KNEE ARTHROPLASTY (Right: Knee)     Patient location during evaluation: PACU Anesthesia Type: MAC and Spinal Level of consciousness: awake and alert Pain management: pain level controlled Vital Signs Assessment: post-procedure vital signs reviewed and stable Respiratory status: spontaneous breathing and respiratory function stable Cardiovascular status: stable Postop Assessment: no apparent nausea or vomiting Anesthetic complications: no  No notable events documented.  Last Vitals:  Vitals:   08/19/22 1130 08/19/22 1145  BP: 112/71 110/74  Pulse: (!) 55 61  Resp: 11 13  Temp:    SpO2: 99% 100%    Last Pain:  Vitals:   08/19/22 1145  TempSrc:   PainSc: 2                  Nilsa Macht DANIEL

## 2022-08-19 NOTE — Discharge Instructions (Signed)
INSTRUCTIONS AFTER JOINT REPLACEMENT   Remove items at home which could result in a fall. This includes throw rugs or furniture in walking pathways ICE to the affected joint every three hours while awake for 30 minutes at a time, for at least the first 3-5 days, and then as needed for pain and swelling.  Continue to use ice for pain and swelling. You may notice swelling that will progress down to the foot and ankle.  This is normal after surgery.  Elevate your leg when you are not up walking on it.   Continue to use the breathing machine you got in the hospital (incentive spirometer) which will help keep your temperature down.  It is common for your temperature to cycle up and down following surgery, especially at night when you are not up moving around and exerting yourself.  The breathing machine keeps your lungs expanded and your temperature down.   DIET:  As you were doing prior to hospitalization, we recommend a well-balanced diet.  DRESSING / WOUND CARE / SHOWERING  Keep the surgical dressing until follow up.  The dressing is water proof, so you can shower without any extra covering.  IF THE DRESSING FALLS OFF or the wound gets wet inside, change the dressing with sterile gauze.  Please use good hand washing techniques before changing the dressing.  Do not use any lotions or creams on the incision until instructed by your surgeon.    ACTIVITY  Increase activity slowly as tolerated, but follow the weight bearing instructions below.   No driving for 6 weeks or until further direction given by your physician.  You cannot drive while taking narcotics.  No lifting or carrying greater than 10 lbs. until further directed by your surgeon. Avoid periods of inactivity such as sitting longer than an hour when not asleep. This helps prevent blood clots.  You may return to work once you are authorized by your doctor.     WEIGHT BEARING   Weight bearing as tolerated with assist device (walker, cane,  etc) as directed, use it as long as suggested by your surgeon or therapist, typically at least 4-6 weeks.   EXERCISES  Results after joint replacement surgery are often greatly improved when you follow the exercise, range of motion and muscle strengthening exercises prescribed by your doctor. Safety measures are also important to protect the joint from further injury. Any time any of these exercises cause you to have increased pain or swelling, decrease what you are doing until you are comfortable again and then slowly increase them. If you have problems or questions, call your caregiver or physical therapist for advice.   Rehabilitation is important following a joint replacement. After just a few days of immobilization, the muscles of the leg can become weakened and shrink (atrophy).  These exercises are designed to build up the tone and strength of the thigh and leg muscles and to improve motion. Often times heat used for twenty to thirty minutes before working out will loosen up your tissues and help with improving the range of motion but do not use heat for the first two weeks following surgery (sometimes heat can increase post-operative swelling).   These exercises can be done on a training (exercise) mat, on the floor, on a table or on a bed. Use whatever works the best and is most comfortable for you.    Use music or television while you are exercising so that the exercises are a pleasant break in your   day. This will make your life better with the exercises acting as a break in your routine that you can look forward to.   Perform all exercises about fifteen times, three times per day or as directed.  You should exercise both the operative leg and the other leg as well.  Exercises include:   Quad Sets - Tighten up the muscle on the front of the thigh (Quad) and hold for 5-10 seconds.   Straight Leg Raises - With your knee straight (if you were given a brace, keep it on), lift the leg to 60  degrees, hold for 3 seconds, and slowly lower the leg.  Perform this exercise against resistance later as your leg gets stronger.  Leg Slides: Lying on your back, slowly slide your foot toward your buttocks, bending your knee up off the floor (only go as far as is comfortable). Then slowly slide your foot back down until your leg is flat on the floor again.  Angel Wings: Lying on your back spread your legs to the side as far apart as you can without causing discomfort.  Hamstring Strength:  Lying on your back, push your heel against the floor with your leg straight by tightening up the muscles of your buttocks.  Repeat, but this time bend your knee to a comfortable angle, and push your heel against the floor.  You may put a pillow under the heel to make it more comfortable if necessary.   A rehabilitation program following joint replacement surgery can speed recovery and prevent re-injury in the future due to weakened muscles. Contact your doctor or a physical therapist for more information on knee rehabilitation.    CONSTIPATION  Constipation is defined medically as fewer than three stools per week and severe constipation as less than one stool per week.  Even if you have a regular bowel pattern at home, your normal regimen is likely to be disrupted due to multiple reasons following surgery.  Combination of anesthesia, postoperative narcotics, change in appetite and fluid intake all can affect your bowels.   YOU MUST use at least one of the following options; they are listed in order of increasing strength to get the job done.  They are all available over the counter, and you may need to use some, POSSIBLY even all of these options:    Drink plenty of fluids (prune juice may be helpful) and high fiber foods Colace 100 mg by mouth twice a day  Senokot for constipation as directed and as needed Dulcolax (bisacodyl), take with full glass of water  Miralax (polyethylene glycol) once or twice a day as  needed.  If you have tried all these things and are unable to have a bowel movement in the first 3-4 days after surgery call either your surgeon or your primary doctor.    If you experience loose stools or diarrhea, hold the medications until you stool forms back up.  If your symptoms do not get better within 1 week or if they get worse, check with your doctor.  If you experience "the worst abdominal pain ever" or develop nausea or vomiting, please contact the office immediately for further recommendations for treatment.   ITCHING:  If you experience itching with your medications, try taking only a single pain pill, or even half a pain pill at a time.  You can also use Benadryl over the counter for itching or also to help with sleep.   TED HOSE STOCKINGS:  Use stockings on both   legs until for at least 2 weeks or as directed by physician office. They may be removed at night for sleeping.  MEDICATIONS:  See your medication summary on the "After Visit Summary" that nursing will review with you.  You may have some home medications which will be placed on hold until you complete the course of blood thinner medication.  It is important for you to complete the blood thinner medication as prescribed.   Blood clot prevention (DVT Prophylaxis): After surgery you are at an increased risk for a blood clot. you were prescribed a blood thinner, Aspirin '81mg'$ , to be taken twice daily for a total of 4 weeks from surgery to help reduce your risk of getting a blood clot. This will help prevent a blood clot. Signs of a pulmonary embolus (blood clot in the lungs) include sudden short of breath, feeling lightheaded or dizzy, chest pain with a deep breath, rapid pulse rapid breathing. Signs of a blood clot in your arms or legs include new unexplained swelling and cramping, warm, red or darkened skin around the painful area. Please call the office or 911 right away if these signs or symptoms develop.  PRECAUTIONS:  If you  experience chest pain or shortness of breath - call 911 immediately for transfer to the hospital emergency department.   If you develop a fever greater that 101 F, purulent drainage from wound, increased redness or drainage from wound, foul odor from the wound/dressing, or calf pain - CONTACT YOUR SURGEON.                                                   FOLLOW-UP APPOINTMENTS:  If you do not already have a post-op appointment, please call the office for an appointment to be seen by your surgeon.  Guidelines for how soon to be seen are listed in your "After Visit Summary", but are typically 1 week after surgery.  OTHER INSTRUCTIONS:   Knee Replacement:  Do not place pillow under knee, focus on keeping the knee straight while resting.   DO NOT modify, tear, cut, or change the foam block in any way.  POST-OPERATIVE OPIOID TAPER INSTRUCTIONS: It is important to wean off of your opioid medication as soon as possible. If you do not need pain medication after your surgery it is ok to stop day one. Opioids include: Codeine, Hydrocodone(Norco, Vicodin), Oxycodone(Percocet, oxycontin) and hydromorphone amongst others.  Long term and even short term use of opiods can cause: Increased pain response Dependence Constipation Depression Respiratory depression And more.  Withdrawal symptoms can include Flu like symptoms Nausea, vomiting And more Techniques to manage these symptoms Hydrate well Eat regular healthy meals Stay active Use relaxation techniques(deep breathing, meditating, yoga) Do Not substitute Alcohol to help with tapering If you have been on opioids for less than two weeks and do not have pain than it is ok to stop all together.  Plan to wean off of opioids This plan should start within one week post op of your joint replacement. Maintain the same interval or time between taking each dose and first decrease the dose.  Cut the total daily intake of opioids by one tablet each  day Next start to increase the time between doses. The last dose that should be eliminated is the evening dose.   MAKE SURE YOU:  Understand these instructions.  Get help right away if you are not doing well or get worse.    Thank you for letting us be a part of your medical care team.  It is a privilege we respect greatly.  We hope these instructions will help you stay on track for a fast and full recovery!

## 2022-08-19 NOTE — Interval H&P Note (Signed)

## 2022-08-19 NOTE — Anesthesia Preprocedure Evaluation (Addendum)
Anesthesia Evaluation  Patient identified by MRN, date of birth, ID band Patient awake    Reviewed: Allergy & Precautions, H&P , NPO status , Patient's Chart, lab work & pertinent test results  History of Anesthesia Complications Negative for: history of anesthetic complications  Airway Mallampati: II  TM Distance: >3 FB Neck ROM: Full    Dental no notable dental hx. (+) Dental Advisory Given   Pulmonary sleep apnea , former smoker   Pulmonary exam normal        Cardiovascular hypertension, Pt. on medications Normal cardiovascular exam  Echocardiogram 05/08/2022:  Left ventricle cavity is normal in size and wall thickness. Normal global  wall motion. Normal LV systolic function with EF 70%. Normal diastolic  filling pattern.  Left atrial cavity is mildly dilated. Aneurysmal interatrial septum  without 2D or color Doppler evidence of shunting.  Mild (Grade I) mitral regurgitation.  Trace pericardial effusion.  Normal right atrial pressure.  No significant change compared to previous study in 2014.     Neuro/Psych negative neurological ROS  negative psych ROS   GI/Hepatic negative GI ROS, Neg liver ROS,,,  Endo/Other  diabetes, Type 2    Renal/GU negative Renal ROS  negative genitourinary   Musculoskeletal negative musculoskeletal ROS (+)    Abdominal   Peds negative pediatric ROS (+)  Hematology negative hematology ROS (+)   Anesthesia Other Findings   Reproductive/Obstetrics negative OB ROS                             Anesthesia Physical Anesthesia Plan  ASA: 3  Anesthesia Plan: Spinal and MAC   Post-op Pain Management: Regional block* and Tylenol PO (pre-op)*   Induction: Intravenous  PONV Risk Score and Plan: 1 and Propofol infusion, Ondansetron and Treatment may vary due to age or medical condition  Airway Management Planned: Simple Face Mask and Natural  Airway  Additional Equipment:   Intra-op Plan:   Post-operative Plan:   Informed Consent: I have reviewed the patients History and Physical, chart, labs and discussed the procedure including the risks, benefits and alternatives for the proposed anesthesia with the patient or authorized representative who has indicated his/her understanding and acceptance.     Dental advisory given  Plan Discussed with: CRNA, Surgeon and Anesthesiologist  Anesthesia Plan Comments:        Anesthesia Quick Evaluation

## 2022-08-21 ENCOUNTER — Encounter (HOSPITAL_COMMUNITY): Payer: Self-pay | Admitting: Orthopedic Surgery

## 2022-08-22 DIAGNOSIS — M6281 Muscle weakness (generalized): Secondary | ICD-10-CM | POA: Diagnosis not present

## 2022-08-22 DIAGNOSIS — R269 Unspecified abnormalities of gait and mobility: Secondary | ICD-10-CM | POA: Diagnosis not present

## 2022-08-22 DIAGNOSIS — M1711 Unilateral primary osteoarthritis, right knee: Secondary | ICD-10-CM | POA: Diagnosis not present

## 2022-08-29 DIAGNOSIS — M1711 Unilateral primary osteoarthritis, right knee: Secondary | ICD-10-CM | POA: Diagnosis not present

## 2022-09-02 DIAGNOSIS — M6281 Muscle weakness (generalized): Secondary | ICD-10-CM | POA: Diagnosis not present

## 2022-09-02 DIAGNOSIS — R269 Unspecified abnormalities of gait and mobility: Secondary | ICD-10-CM | POA: Diagnosis not present

## 2022-09-02 DIAGNOSIS — M1711 Unilateral primary osteoarthritis, right knee: Secondary | ICD-10-CM | POA: Diagnosis not present

## 2022-09-05 DIAGNOSIS — M6281 Muscle weakness (generalized): Secondary | ICD-10-CM | POA: Diagnosis not present

## 2022-09-05 DIAGNOSIS — R269 Unspecified abnormalities of gait and mobility: Secondary | ICD-10-CM | POA: Diagnosis not present

## 2022-09-05 DIAGNOSIS — M1711 Unilateral primary osteoarthritis, right knee: Secondary | ICD-10-CM | POA: Diagnosis not present

## 2022-09-11 DIAGNOSIS — Z86010 Personal history of colonic polyps: Secondary | ICD-10-CM | POA: Diagnosis not present

## 2022-09-11 DIAGNOSIS — I10 Essential (primary) hypertension: Secondary | ICD-10-CM | POA: Diagnosis not present

## 2022-09-11 DIAGNOSIS — E119 Type 2 diabetes mellitus without complications: Secondary | ICD-10-CM | POA: Diagnosis not present

## 2022-09-11 DIAGNOSIS — K5903 Drug induced constipation: Secondary | ICD-10-CM | POA: Diagnosis not present

## 2022-10-03 DIAGNOSIS — M1711 Unilateral primary osteoarthritis, right knee: Secondary | ICD-10-CM | POA: Diagnosis not present

## 2022-10-04 DIAGNOSIS — R634 Abnormal weight loss: Secondary | ICD-10-CM | POA: Diagnosis not present

## 2022-10-04 DIAGNOSIS — M6284 Sarcopenia: Secondary | ICD-10-CM | POA: Diagnosis not present

## 2022-10-04 DIAGNOSIS — I10 Essential (primary) hypertension: Secondary | ICD-10-CM | POA: Diagnosis not present

## 2022-10-04 DIAGNOSIS — Z859 Personal history of malignant neoplasm, unspecified: Secondary | ICD-10-CM | POA: Diagnosis not present

## 2022-10-04 DIAGNOSIS — Z125 Encounter for screening for malignant neoplasm of prostate: Secondary | ICD-10-CM | POA: Diagnosis not present

## 2022-10-04 DIAGNOSIS — E1169 Type 2 diabetes mellitus with other specified complication: Secondary | ICD-10-CM | POA: Diagnosis not present

## 2022-10-04 DIAGNOSIS — E782 Mixed hyperlipidemia: Secondary | ICD-10-CM | POA: Diagnosis not present

## 2022-10-04 DIAGNOSIS — R439 Unspecified disturbances of smell and taste: Secondary | ICD-10-CM | POA: Diagnosis not present

## 2022-11-05 DIAGNOSIS — I119 Hypertensive heart disease without heart failure: Secondary | ICD-10-CM | POA: Diagnosis not present

## 2022-11-05 DIAGNOSIS — R634 Abnormal weight loss: Secondary | ICD-10-CM | POA: Diagnosis not present

## 2022-11-05 DIAGNOSIS — R627 Adult failure to thrive: Secondary | ICD-10-CM | POA: Diagnosis not present

## 2022-11-05 DIAGNOSIS — M6284 Sarcopenia: Secondary | ICD-10-CM | POA: Diagnosis not present

## 2022-11-05 DIAGNOSIS — I10 Essential (primary) hypertension: Secondary | ICD-10-CM | POA: Diagnosis not present

## 2022-11-14 DIAGNOSIS — H04123 Dry eye syndrome of bilateral lacrimal glands: Secondary | ICD-10-CM | POA: Diagnosis not present

## 2022-11-14 DIAGNOSIS — H40013 Open angle with borderline findings, low risk, bilateral: Secondary | ICD-10-CM | POA: Diagnosis not present

## 2022-11-14 DIAGNOSIS — H10413 Chronic giant papillary conjunctivitis, bilateral: Secondary | ICD-10-CM | POA: Diagnosis not present

## 2022-11-14 DIAGNOSIS — M1711 Unilateral primary osteoarthritis, right knee: Secondary | ICD-10-CM | POA: Diagnosis not present

## 2022-11-14 DIAGNOSIS — Z961 Presence of intraocular lens: Secondary | ICD-10-CM | POA: Diagnosis not present

## 2022-11-14 DIAGNOSIS — H2511 Age-related nuclear cataract, right eye: Secondary | ICD-10-CM | POA: Diagnosis not present

## 2022-12-06 DIAGNOSIS — E1169 Type 2 diabetes mellitus with other specified complication: Secondary | ICD-10-CM | POA: Diagnosis not present

## 2022-12-06 DIAGNOSIS — I10 Essential (primary) hypertension: Secondary | ICD-10-CM | POA: Diagnosis not present

## 2022-12-06 DIAGNOSIS — T7840XA Allergy, unspecified, initial encounter: Secondary | ICD-10-CM | POA: Diagnosis not present

## 2022-12-16 DIAGNOSIS — I10 Essential (primary) hypertension: Secondary | ICD-10-CM | POA: Diagnosis not present

## 2022-12-16 DIAGNOSIS — J301 Allergic rhinitis due to pollen: Secondary | ICD-10-CM | POA: Diagnosis not present

## 2022-12-26 DIAGNOSIS — C61 Malignant neoplasm of prostate: Secondary | ICD-10-CM | POA: Diagnosis not present

## 2022-12-26 DIAGNOSIS — Z8546 Personal history of malignant neoplasm of prostate: Secondary | ICD-10-CM | POA: Diagnosis not present

## 2022-12-31 DIAGNOSIS — K6389 Other specified diseases of intestine: Secondary | ICD-10-CM | POA: Diagnosis not present

## 2022-12-31 DIAGNOSIS — K5730 Diverticulosis of large intestine without perforation or abscess without bleeding: Secondary | ICD-10-CM | POA: Diagnosis not present

## 2022-12-31 DIAGNOSIS — K562 Volvulus: Secondary | ICD-10-CM | POA: Diagnosis not present

## 2022-12-31 DIAGNOSIS — Z86010 Personal history of colonic polyps: Secondary | ICD-10-CM | POA: Diagnosis not present

## 2022-12-31 DIAGNOSIS — K635 Polyp of colon: Secondary | ICD-10-CM | POA: Diagnosis not present

## 2023-01-02 DIAGNOSIS — N35012 Post-traumatic membranous urethral stricture: Secondary | ICD-10-CM | POA: Diagnosis not present

## 2023-01-02 DIAGNOSIS — N401 Enlarged prostate with lower urinary tract symptoms: Secondary | ICD-10-CM | POA: Diagnosis not present

## 2023-01-02 DIAGNOSIS — Z8546 Personal history of malignant neoplasm of prostate: Secondary | ICD-10-CM | POA: Diagnosis not present

## 2023-01-02 DIAGNOSIS — R351 Nocturia: Secondary | ICD-10-CM | POA: Diagnosis not present

## 2023-01-03 DIAGNOSIS — R269 Unspecified abnormalities of gait and mobility: Secondary | ICD-10-CM | POA: Diagnosis not present

## 2023-01-03 DIAGNOSIS — R001 Bradycardia, unspecified: Secondary | ICD-10-CM | POA: Diagnosis not present

## 2023-01-03 DIAGNOSIS — D84821 Immunodeficiency due to drugs: Secondary | ICD-10-CM | POA: Diagnosis not present

## 2023-01-03 DIAGNOSIS — N182 Chronic kidney disease, stage 2 (mild): Secondary | ICD-10-CM | POA: Diagnosis not present

## 2023-01-03 DIAGNOSIS — E785 Hyperlipidemia, unspecified: Secondary | ICD-10-CM | POA: Diagnosis not present

## 2023-01-03 DIAGNOSIS — J302 Other seasonal allergic rhinitis: Secondary | ICD-10-CM | POA: Diagnosis not present

## 2023-01-03 DIAGNOSIS — I129 Hypertensive chronic kidney disease with stage 1 through stage 4 chronic kidney disease, or unspecified chronic kidney disease: Secondary | ICD-10-CM | POA: Diagnosis not present

## 2023-01-03 DIAGNOSIS — R634 Abnormal weight loss: Secondary | ICD-10-CM | POA: Diagnosis not present

## 2023-01-03 DIAGNOSIS — N529 Male erectile dysfunction, unspecified: Secondary | ICD-10-CM | POA: Diagnosis not present

## 2023-01-15 DIAGNOSIS — H40013 Open angle with borderline findings, low risk, bilateral: Secondary | ICD-10-CM | POA: Diagnosis not present

## 2023-01-16 DIAGNOSIS — M6284 Sarcopenia: Secondary | ICD-10-CM | POA: Diagnosis not present

## 2023-01-16 DIAGNOSIS — E1169 Type 2 diabetes mellitus with other specified complication: Secondary | ICD-10-CM | POA: Diagnosis not present

## 2023-01-16 DIAGNOSIS — E782 Mixed hyperlipidemia: Secondary | ICD-10-CM | POA: Diagnosis not present

## 2023-01-16 DIAGNOSIS — I10 Essential (primary) hypertension: Secondary | ICD-10-CM | POA: Diagnosis not present

## 2023-01-21 DIAGNOSIS — H2511 Age-related nuclear cataract, right eye: Secondary | ICD-10-CM | POA: Diagnosis not present

## 2023-01-21 DIAGNOSIS — Z79899 Other long term (current) drug therapy: Secondary | ICD-10-CM | POA: Diagnosis not present

## 2023-01-21 DIAGNOSIS — H353131 Nonexudative age-related macular degeneration, bilateral, early dry stage: Secondary | ICD-10-CM | POA: Diagnosis not present

## 2023-01-21 DIAGNOSIS — H35033 Hypertensive retinopathy, bilateral: Secondary | ICD-10-CM | POA: Diagnosis not present

## 2023-01-21 DIAGNOSIS — Z961 Presence of intraocular lens: Secondary | ICD-10-CM | POA: Diagnosis not present

## 2023-01-21 DIAGNOSIS — H33312 Horseshoe tear of retina without detachment, left eye: Secondary | ICD-10-CM | POA: Diagnosis not present

## 2023-01-21 DIAGNOSIS — H209 Unspecified iridocyclitis: Secondary | ICD-10-CM | POA: Diagnosis not present

## 2023-02-13 DIAGNOSIS — M1711 Unilateral primary osteoarthritis, right knee: Secondary | ICD-10-CM | POA: Diagnosis not present

## 2023-03-06 ENCOUNTER — Other Ambulatory Visit: Payer: Self-pay | Admitting: Family Medicine

## 2023-03-06 ENCOUNTER — Ambulatory Visit
Admission: RE | Admit: 2023-03-06 | Discharge: 2023-03-06 | Disposition: A | Discharge status: Home / Self Care | Payer: Medicare PPO | Source: Ambulatory Visit | Attending: Family Medicine | Admitting: Family Medicine

## 2023-03-06 DIAGNOSIS — R634 Abnormal weight loss: Secondary | ICD-10-CM

## 2023-03-06 DIAGNOSIS — E1169 Type 2 diabetes mellitus with other specified complication: Secondary | ICD-10-CM | POA: Diagnosis not present

## 2023-03-06 DIAGNOSIS — R0689 Other abnormalities of breathing: Secondary | ICD-10-CM | POA: Diagnosis not present

## 2023-03-06 DIAGNOSIS — M6284 Sarcopenia: Secondary | ICD-10-CM | POA: Diagnosis not present

## 2023-03-06 DIAGNOSIS — I10 Essential (primary) hypertension: Secondary | ICD-10-CM | POA: Diagnosis not present

## 2023-03-06 DIAGNOSIS — Z6825 Body mass index (BMI) 25.0-25.9, adult: Secondary | ICD-10-CM | POA: Diagnosis not present

## 2023-03-06 DIAGNOSIS — R627 Adult failure to thrive: Secondary | ICD-10-CM | POA: Diagnosis not present

## 2023-03-06 DIAGNOSIS — Z125 Encounter for screening for malignant neoplasm of prostate: Secondary | ICD-10-CM | POA: Diagnosis not present

## 2023-03-21 DIAGNOSIS — I10 Essential (primary) hypertension: Secondary | ICD-10-CM | POA: Diagnosis not present

## 2023-03-21 DIAGNOSIS — E782 Mixed hyperlipidemia: Secondary | ICD-10-CM | POA: Diagnosis not present

## 2023-03-21 DIAGNOSIS — E1169 Type 2 diabetes mellitus with other specified complication: Secondary | ICD-10-CM | POA: Diagnosis not present

## 2023-03-21 DIAGNOSIS — M6284 Sarcopenia: Secondary | ICD-10-CM | POA: Diagnosis not present

## 2023-03-26 ENCOUNTER — Ambulatory Visit: Payer: Medicare PPO | Admitting: Internal Medicine

## 2023-03-27 ENCOUNTER — Ambulatory Visit: Payer: Medicare PPO | Admitting: Cardiology

## 2023-04-11 DIAGNOSIS — J310 Chronic rhinitis: Secondary | ICD-10-CM | POA: Diagnosis not present

## 2023-04-11 DIAGNOSIS — H903 Sensorineural hearing loss, bilateral: Secondary | ICD-10-CM | POA: Diagnosis not present

## 2023-04-11 DIAGNOSIS — R499 Unspecified voice and resonance disorder: Secondary | ICD-10-CM | POA: Diagnosis not present

## 2023-04-25 DIAGNOSIS — R490 Dysphonia: Secondary | ICD-10-CM | POA: Diagnosis not present

## 2023-04-29 IMAGING — CR DG TIBIA/FIBULA 2V*L*
3 series · 3 of 3 positions shown · non-contrast
Comparison: None.

CLINICAL DATA: Leg pain

EXAM:
LEFT TIBIA AND FIBULA - 2 VIEW

[tibia ap (1 of 2)]
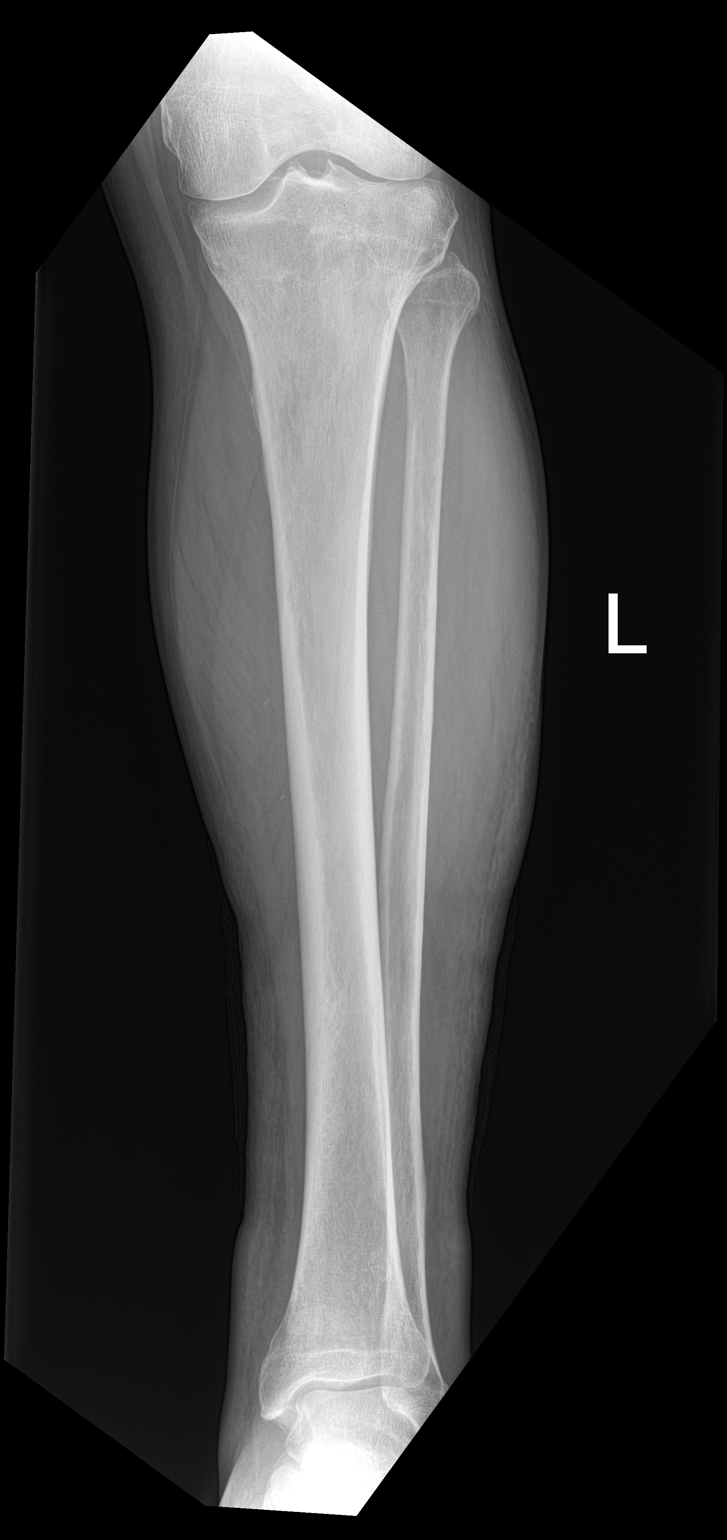

[tibia ap (2 of 2)]
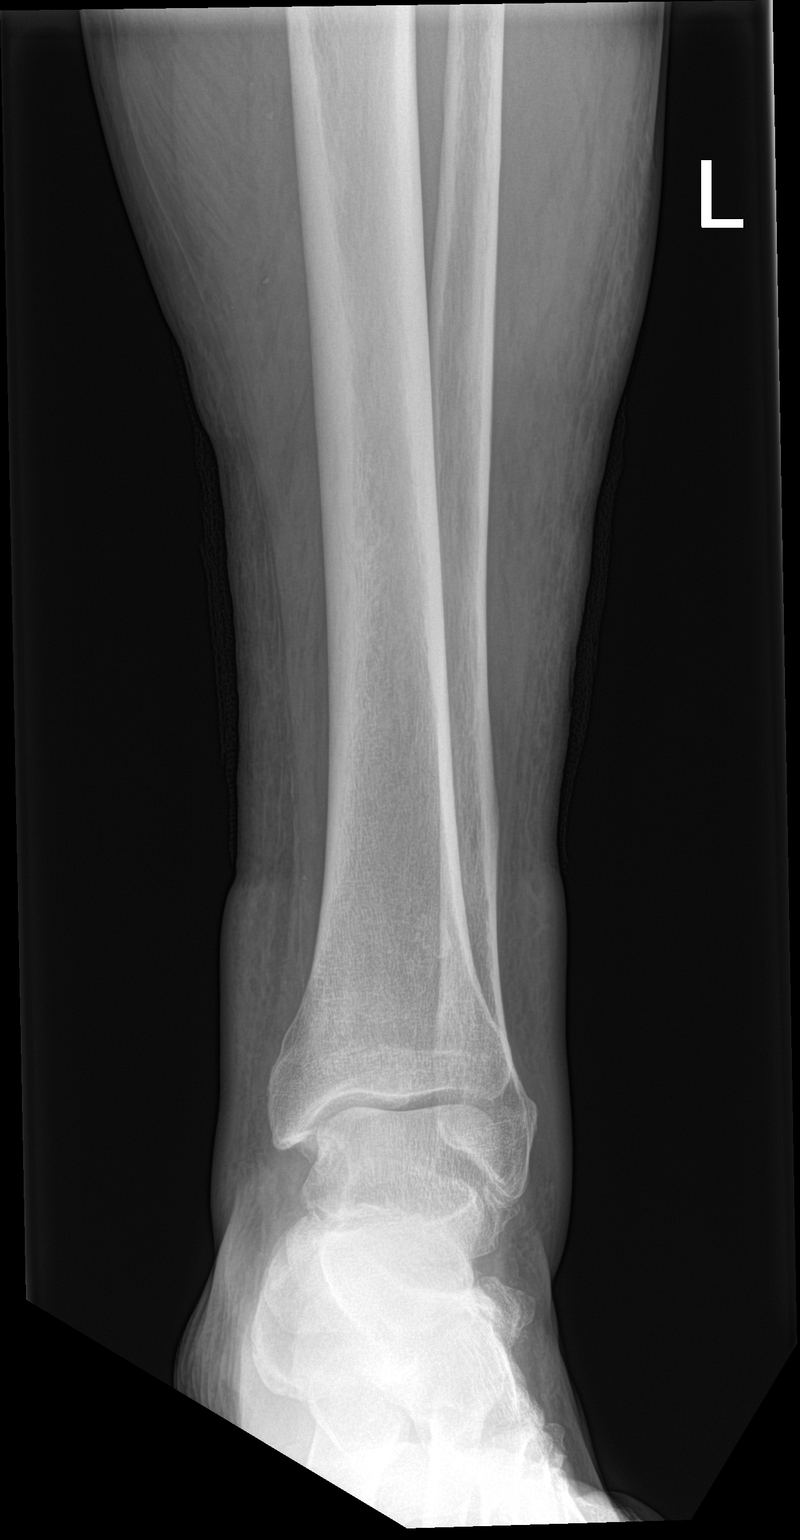

[tibia lat]
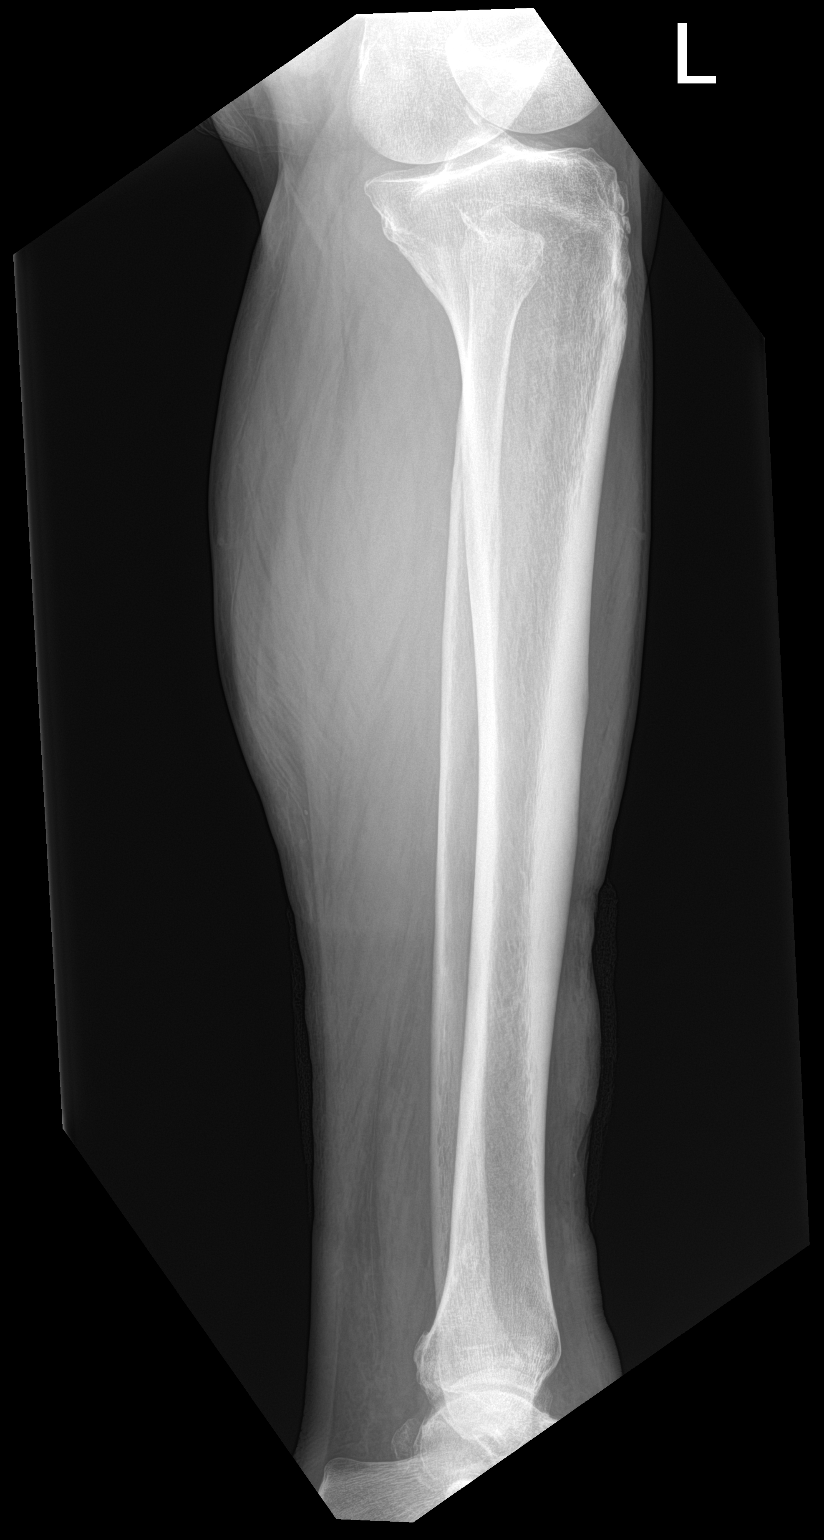

[3 of 3 positions shown; findings below may reference images not displayed]

FINDINGS: No fracture or malalignment. No periostitis or bone destruction.
Diffuse soft tissue edema
IMPRESSION: No acute osseous abnormality

## 2023-05-22 DIAGNOSIS — D171 Benign lipomatous neoplasm of skin and subcutaneous tissue of trunk: Secondary | ICD-10-CM | POA: Diagnosis not present

## 2023-05-22 DIAGNOSIS — I10 Essential (primary) hypertension: Secondary | ICD-10-CM | POA: Diagnosis not present

## 2023-05-22 DIAGNOSIS — E782 Mixed hyperlipidemia: Secondary | ICD-10-CM | POA: Diagnosis not present

## 2023-05-22 DIAGNOSIS — E1169 Type 2 diabetes mellitus with other specified complication: Secondary | ICD-10-CM | POA: Diagnosis not present

## 2023-07-10 DIAGNOSIS — Z87891 Personal history of nicotine dependence: Secondary | ICD-10-CM | POA: Diagnosis not present

## 2023-07-10 DIAGNOSIS — R490 Dysphonia: Secondary | ICD-10-CM | POA: Diagnosis not present

## 2023-07-10 DIAGNOSIS — J383 Other diseases of vocal cords: Secondary | ICD-10-CM | POA: Diagnosis not present

## 2023-07-10 DIAGNOSIS — R499 Unspecified voice and resonance disorder: Secondary | ICD-10-CM | POA: Diagnosis not present

## 2023-07-24 DIAGNOSIS — H40013 Open angle with borderline findings, low risk, bilateral: Secondary | ICD-10-CM | POA: Diagnosis not present

## 2023-08-12 DIAGNOSIS — M25551 Pain in right hip: Secondary | ICD-10-CM | POA: Diagnosis not present

## 2023-08-12 DIAGNOSIS — M1711 Unilateral primary osteoarthritis, right knee: Secondary | ICD-10-CM | POA: Diagnosis not present

## 2023-08-21 DIAGNOSIS — Z6826 Body mass index (BMI) 26.0-26.9, adult: Secondary | ICD-10-CM | POA: Diagnosis not present

## 2023-08-21 DIAGNOSIS — S1983XD Other specified injuries of vocal cord, subsequent encounter: Secondary | ICD-10-CM | POA: Diagnosis not present

## 2023-08-21 DIAGNOSIS — E1169 Type 2 diabetes mellitus with other specified complication: Secondary | ICD-10-CM | POA: Diagnosis not present

## 2023-08-21 DIAGNOSIS — I10 Essential (primary) hypertension: Secondary | ICD-10-CM | POA: Diagnosis not present

## 2023-08-21 DIAGNOSIS — E559 Vitamin D deficiency, unspecified: Secondary | ICD-10-CM | POA: Diagnosis not present

## 2023-08-21 DIAGNOSIS — E782 Mixed hyperlipidemia: Secondary | ICD-10-CM | POA: Diagnosis not present

## 2023-08-25 DIAGNOSIS — M25561 Pain in right knee: Secondary | ICD-10-CM | POA: Diagnosis not present

## 2023-08-25 DIAGNOSIS — M7631 Iliotibial band syndrome, right leg: Secondary | ICD-10-CM | POA: Diagnosis not present

## 2023-09-09 DIAGNOSIS — M25561 Pain in right knee: Secondary | ICD-10-CM | POA: Diagnosis not present

## 2023-10-07 DIAGNOSIS — H353131 Nonexudative age-related macular degeneration, bilateral, early dry stage: Secondary | ICD-10-CM | POA: Diagnosis not present

## 2023-10-07 DIAGNOSIS — H33312 Horseshoe tear of retina without detachment, left eye: Secondary | ICD-10-CM | POA: Diagnosis not present

## 2023-10-07 DIAGNOSIS — H35033 Hypertensive retinopathy, bilateral: Secondary | ICD-10-CM | POA: Diagnosis not present

## 2023-10-07 DIAGNOSIS — Z79899 Other long term (current) drug therapy: Secondary | ICD-10-CM | POA: Diagnosis not present

## 2023-10-07 DIAGNOSIS — H2511 Age-related nuclear cataract, right eye: Secondary | ICD-10-CM | POA: Diagnosis not present

## 2023-10-07 DIAGNOSIS — Z961 Presence of intraocular lens: Secondary | ICD-10-CM | POA: Diagnosis not present

## 2023-10-07 DIAGNOSIS — H209 Unspecified iridocyclitis: Secondary | ICD-10-CM | POA: Diagnosis not present

## 2023-11-11 DIAGNOSIS — M25561 Pain in right knee: Secondary | ICD-10-CM | POA: Diagnosis not present

## 2023-11-20 DIAGNOSIS — M25561 Pain in right knee: Secondary | ICD-10-CM | POA: Diagnosis not present

## 2023-12-18 DIAGNOSIS — M7631 Iliotibial band syndrome, right leg: Secondary | ICD-10-CM | POA: Diagnosis not present

## 2023-12-22 DIAGNOSIS — M6284 Sarcopenia: Secondary | ICD-10-CM | POA: Diagnosis not present

## 2023-12-22 DIAGNOSIS — E1169 Type 2 diabetes mellitus with other specified complication: Secondary | ICD-10-CM | POA: Diagnosis not present

## 2023-12-22 DIAGNOSIS — E782 Mixed hyperlipidemia: Secondary | ICD-10-CM | POA: Diagnosis not present

## 2023-12-22 DIAGNOSIS — I10 Essential (primary) hypertension: Secondary | ICD-10-CM | POA: Diagnosis not present

## 2023-12-22 DIAGNOSIS — Z125 Encounter for screening for malignant neoplasm of prostate: Secondary | ICD-10-CM | POA: Diagnosis not present

## 2023-12-22 DIAGNOSIS — E1100 Type 2 diabetes mellitus with hyperosmolarity without nonketotic hyperglycemic-hyperosmolar coma (NKHHC): Secondary | ICD-10-CM | POA: Diagnosis not present

## 2024-01-28 DIAGNOSIS — H40013 Open angle with borderline findings, low risk, bilateral: Secondary | ICD-10-CM | POA: Diagnosis not present

## 2024-01-28 DIAGNOSIS — H25811 Combined forms of age-related cataract, right eye: Secondary | ICD-10-CM | POA: Diagnosis not present

## 2024-01-28 DIAGNOSIS — Z961 Presence of intraocular lens: Secondary | ICD-10-CM | POA: Diagnosis not present

## 2024-02-10 DIAGNOSIS — H33312 Horseshoe tear of retina without detachment, left eye: Secondary | ICD-10-CM | POA: Diagnosis not present

## 2024-02-10 DIAGNOSIS — H35033 Hypertensive retinopathy, bilateral: Secondary | ICD-10-CM | POA: Diagnosis not present

## 2024-02-10 DIAGNOSIS — Z79899 Other long term (current) drug therapy: Secondary | ICD-10-CM | POA: Diagnosis not present

## 2024-02-10 DIAGNOSIS — H209 Unspecified iridocyclitis: Secondary | ICD-10-CM | POA: Diagnosis not present

## 2024-02-10 DIAGNOSIS — H2511 Age-related nuclear cataract, right eye: Secondary | ICD-10-CM | POA: Diagnosis not present

## 2024-02-10 DIAGNOSIS — H353131 Nonexudative age-related macular degeneration, bilateral, early dry stage: Secondary | ICD-10-CM | POA: Diagnosis not present

## 2024-02-10 DIAGNOSIS — Z961 Presence of intraocular lens: Secondary | ICD-10-CM | POA: Diagnosis not present

## 2024-02-19 DIAGNOSIS — N35012 Post-traumatic membranous urethral stricture: Secondary | ICD-10-CM | POA: Diagnosis not present

## 2024-02-19 DIAGNOSIS — Z8546 Personal history of malignant neoplasm of prostate: Secondary | ICD-10-CM | POA: Diagnosis not present

## 2024-02-20 DIAGNOSIS — H52221 Regular astigmatism, right eye: Secondary | ICD-10-CM | POA: Diagnosis not present

## 2024-02-20 DIAGNOSIS — H25811 Combined forms of age-related cataract, right eye: Secondary | ICD-10-CM | POA: Diagnosis not present

## 2024-04-23 DIAGNOSIS — E089 Diabetes mellitus due to underlying condition without complications: Secondary | ICD-10-CM | POA: Diagnosis not present

## 2024-04-23 DIAGNOSIS — I10 Essential (primary) hypertension: Secondary | ICD-10-CM | POA: Diagnosis not present

## 2024-04-23 DIAGNOSIS — E782 Mixed hyperlipidemia: Secondary | ICD-10-CM | POA: Diagnosis not present

## 2024-04-23 DIAGNOSIS — T7840XA Allergy, unspecified, initial encounter: Secondary | ICD-10-CM | POA: Diagnosis not present

## 2024-04-23 DIAGNOSIS — E559 Vitamin D deficiency, unspecified: Secondary | ICD-10-CM | POA: Diagnosis not present

## 2024-04-23 DIAGNOSIS — M12561 Traumatic arthropathy, right knee: Secondary | ICD-10-CM | POA: Diagnosis not present

## 2024-04-23 DIAGNOSIS — S1983XD Other specified injuries of vocal cord, subsequent encounter: Secondary | ICD-10-CM | POA: Diagnosis not present

## 2024-07-03 ENCOUNTER — Observation Stay (HOSPITAL_COMMUNITY)
Admission: EM | Admit: 2024-07-03 | Discharge: 2024-07-03 | Disposition: A | Discharge status: Home / Self Care | Attending: Emergency Medicine | Admitting: Emergency Medicine

## 2024-07-03 ENCOUNTER — Encounter (HOSPITAL_COMMUNITY): Payer: Self-pay

## 2024-07-03 ENCOUNTER — Emergency Department (HOSPITAL_COMMUNITY)

## 2024-07-03 ENCOUNTER — Observation Stay (HOSPITAL_COMMUNITY)

## 2024-07-03 ENCOUNTER — Other Ambulatory Visit: Payer: Self-pay

## 2024-07-03 DIAGNOSIS — R051 Acute cough: Secondary | ICD-10-CM | POA: Insufficient documentation

## 2024-07-03 DIAGNOSIS — W11XXXA Fall on and from ladder, initial encounter: Secondary | ICD-10-CM | POA: Insufficient documentation

## 2024-07-03 DIAGNOSIS — Z79899 Other long term (current) drug therapy: Secondary | ICD-10-CM | POA: Insufficient documentation

## 2024-07-03 DIAGNOSIS — Z7984 Long term (current) use of oral hypoglycemic drugs: Secondary | ICD-10-CM | POA: Diagnosis not present

## 2024-07-03 DIAGNOSIS — R55 Syncope and collapse: Secondary | ICD-10-CM | POA: Diagnosis present

## 2024-07-03 DIAGNOSIS — R739 Hyperglycemia, unspecified: Secondary | ICD-10-CM | POA: Insufficient documentation

## 2024-07-03 DIAGNOSIS — S93401A Sprain of unspecified ligament of right ankle, initial encounter: Secondary | ICD-10-CM | POA: Insufficient documentation

## 2024-07-03 LAB — BASIC METABOLIC PANEL WITH GFR
Anion gap: 9 (ref 5–15)
BUN: 18 mg/dL (ref 8–23)
CO2: 27 mmol/L (ref 22–32)
Calcium: 9.6 mg/dL (ref 8.9–10.3)
Chloride: 104 mmol/L (ref 98–111)
Creatinine, Ser: 1.27 mg/dL — ABNORMAL HIGH (ref 0.61–1.24)
GFR, Estimated: 59 mL/min — ABNORMAL LOW
Glucose, Bld: 128 mg/dL — ABNORMAL HIGH (ref 70–99)
Potassium: 4.9 mmol/L (ref 3.5–5.1)
Sodium: 140 mmol/L (ref 135–145)

## 2024-07-03 LAB — CBC WITH DIFFERENTIAL/PLATELET
Abs Immature Granulocytes: 0.03 10*3/uL (ref 0.00–0.07)
Basophils Absolute: 0 10*3/uL (ref 0.0–0.1)
Basophils Relative: 0 %
Eosinophils Absolute: 0 10*3/uL (ref 0.0–0.5)
Eosinophils Relative: 0 %
HCT: 38.9 % — ABNORMAL LOW (ref 39.0–52.0)
Hemoglobin: 13.2 g/dL (ref 13.0–17.0)
Immature Granulocytes: 0 %
Lymphocytes Relative: 8 %
Lymphs Abs: 0.8 10*3/uL (ref 0.7–4.0)
MCH: 33.9 pg (ref 26.0–34.0)
MCHC: 33.9 g/dL (ref 30.0–36.0)
MCV: 100 fL (ref 80.0–100.0)
Monocytes Absolute: 1 10*3/uL (ref 0.1–1.0)
Monocytes Relative: 9 %
Neutro Abs: 9.4 10*3/uL — ABNORMAL HIGH (ref 1.7–7.7)
Neutrophils Relative %: 83 %
Platelets: 247 10*3/uL (ref 150–400)
RBC: 3.89 MIL/uL — ABNORMAL LOW (ref 4.22–5.81)
RDW: 13.7 % (ref 11.5–15.5)
WBC: 11.2 10*3/uL — ABNORMAL HIGH (ref 4.0–10.5)
nRBC: 0 % (ref 0.0–0.2)

## 2024-07-03 LAB — RESP PANEL BY RT-PCR (RSV, FLU A&B, COVID)  RVPGX2
Influenza A by PCR: NEGATIVE
Influenza B by PCR: NEGATIVE
Resp Syncytial Virus by PCR: NEGATIVE
SARS Coronavirus 2 by RT PCR: NEGATIVE

## 2024-07-03 LAB — CBG MONITORING, ED: Glucose-Capillary: 110 mg/dL — ABNORMAL HIGH (ref 70–99)

## 2024-07-03 LAB — HEMOGLOBIN A1C
Hgb A1c MFr Bld: 5.8 % — ABNORMAL HIGH (ref 4.8–5.6)
Mean Plasma Glucose: 119.76 mg/dL

## 2024-07-03 LAB — TROPONIN T, HIGH SENSITIVITY
Troponin T High Sensitivity: 12 ng/L (ref 0–19)
Troponin T High Sensitivity: 12 ng/L (ref 0–19)

## 2024-07-03 MED ORDER — ONDANSETRON HCL 4 MG/2ML IJ SOLN: 4.0000 mg | Freq: Four times a day (QID) | INTRAMUSCULAR | Status: DC | PRN

## 2024-07-03 MED ORDER — ONDANSETRON HCL 4 MG PO TABS: 4.0000 mg | ORAL_TABLET | Freq: Four times a day (QID) | ORAL | Status: DC | PRN

## 2024-07-03 MED ORDER — ENOXAPARIN SODIUM 40 MG/0.4ML IJ SOSY: 40.0000 mg | PREFILLED_SYRINGE | INTRAMUSCULAR | Status: DC

## 2024-07-03 MED ORDER — ACETAMINOPHEN 650 MG RE SUPP: 650.0000 mg | Freq: Four times a day (QID) | RECTAL | Status: DC | PRN

## 2024-07-03 MED ORDER — SODIUM CHLORIDE 0.9% FLUSH: 3.0000 mL | Freq: Two times a day (BID) | INTRAVENOUS | Status: DC

## 2024-07-03 MED ORDER — ENOXAPARIN SODIUM 30 MG/0.3ML IJ SOSY: 30.0000 mg | PREFILLED_SYRINGE | INTRAMUSCULAR | Status: DC

## 2024-07-03 MED ORDER — INSULIN ASPART 100 UNIT/ML IJ SOLN: 0.0000 [IU] | Freq: Three times a day (TID) | INTRAMUSCULAR | Status: DC

## 2024-07-03 MED ORDER — ACETAMINOPHEN 325 MG PO TABS
650.0000 mg | ORAL_TABLET | Freq: Four times a day (QID) | ORAL | Status: DC | PRN
  Administered 2024-07-03: 650 mg via ORAL
  Filled 2024-07-03: qty 2

## 2024-07-03 NOTE — ED Triage Notes (Signed)
 Pt to ED from home following a syncopal episode. Pt stepped off a ladder earlier in the day injuring his right ankle and foot. Pt was attempting to walk to the bathroom and he mad it as far as the commode (witnessed by SO), shortly there after she found him unconscious in the bathtub. Pt has no recollection of event. EMS states pt was diaphoretic on their arrival. Arrives A+O, VSS, NADN.

## 2024-07-03 NOTE — ED Provider Notes (Signed)
" °  Physical Exam  BP 124/74   Pulse 61   Temp 97.7 F (36.5 C) (Oral)   Resp 12   Ht 5' 10 (1.778 m)   Wt 84 kg   SpO2 100%   BMI 26.57 kg/m   Physical Exam  Procedures  Procedures  ED Course / MDM    Medical Decision Making Amount and/or Complexity of Data Reviewed Labs: ordered. Radiology: ordered.   Patient had been admitted however is not seen hospitalist yet.  Discussed with patient and husband after they called me into the room.  Do not want to stay.  Worried about the weather.  Aware of risks.  Will discharge AMA.       Patsey Lot, MD 07/03/24 [phone]  "

## 2024-07-03 NOTE — Discharge Instructions (Signed)
 You are leaving against our advice.  This could cause injury to you from causes such as undiagnosed arrhythmia.  Follow-up your doctor feel free to return anytime.

## 2024-07-03 NOTE — H&P (Incomplete)
 " History and Physical    Patient: Larry Welch FMW:[id number] DOB: 03-23-1946 DOA: 07/03/2024 DOS: the patient was seen and examined on 07/03/2024 PCP: Benjamine Aland, MD  Patient coming from: Home  Chief Complaint:  Chief Complaint  Patient presents with   Loss of Consciousness   Ankle Pain   HPI: Larry Welch is a 79 y.o. male with medical history significant of osteoarthritis, prostate cancer, type 2 diabetes, hyperlipidemia, hypertension who presented to the emergency department due to loss of consciousness and right ankle sprain.   Review of Systems: As mentioned in the history of present illness. All other systems reviewed and are negative. Past Medical History:  Diagnosis Date   Arthritis    Cancer (HCC)    prostate   Diabetes mellitus    High cholesterol    Hypertension    Past Surgical History:  Procedure Laterality Date   BACK SURGERY  2009   FOOT SURGERY  [phone]   Ganglion cyst inner right foot.   INCISION AND DRAINAGE Right 05/10/2022   Procedure: INCISION AND DRAINAGE OF RIGHT KNEE BURSA, PREPATELLAR BURSECTOMY;  Surgeon: Shari Sieving, MD;  Location: [facility];  Service: Orthopedics;  Laterality: Right;   INSERTION PROSTATE RADIATION SEED     TOTAL KNEE ARTHROPLASTY Right 08/19/2022   Procedure: TOTAL KNEE ARTHROPLASTY;  Surgeon: Edna Sieving LABOR, MD;  Location: [facility];  Service: Orthopedics;  Laterality: Right;   Social History:  reports that he quit smoking about 35 years ago. His smoking use included cigarettes. He started smoking about 47 years ago. He has a 9 pack-year smoking history. He has never used smokeless tobacco. He reports current alcohol  use. He reports that he does not use drugs.  Allergies[1]  Family History  Problem Relation Age of Onset   Cancer Father        Prostate and Bladder    Prior to Admission medications  Medication Sig Start Date End Date Taking? Authorizing Provider  amLODipine-olmesartan (AZOR) 10-40 MG per tablet Take 1  tablet by mouth at bedtime.    [provider]  Choline Fenofibrate (FENOFIBRIC ACID) 135 MG CPDR Take 135 mg by mouth at bedtime.     [provider]  dorzolamide-timolol (COSOPT) 2-0.5 % ophthalmic solution Place 1 drop into both eyes 2 (two) times daily. 03/23/22   [provider]  doxycycline  (ADOXA) 100 MG tablet Take 1 tablet (100 mg total) by mouth 2 (two) times daily. Patient not taking: Reported on 08/01/2022 05/10/22   Chadwell, Joshua, PA-C  fluticasone (FLONASE) 50 MCG/ACT nasal spray Place 1 spray into both nostrils in the morning and at bedtime.    [provider]  folic acid (FOLVITE) 1 MG tablet Take 1 mg by mouth in the morning. 03/26/22   [provider]  metFORMIN (GLUCOPHAGE) 1000 MG tablet Take 1,000 mg by mouth daily with breakfast.    [provider]  methotrexate (RHEUMATREX) 2.5 MG tablet Take 15 mg by mouth every Tuesday. 03/22/22   [provider]  Multiple Vitamin (MULTIVITAMIN WITH MINERALS) TABS tablet Take 1 tablet by mouth in the morning.    [provider]  rosuvastatin (CRESTOR) 20 MG tablet Take 20 mg by mouth in the morning.    [provider]  sitaGLIPtin (JANUVIA) 100 MG tablet Take 100 mg by mouth in the morning.    [provider]  solifenacin (VESICARE) 5 MG tablet Take 5 mg by mouth in the morning. 03/17/22   [provider]  spironolactone (ALDACTONE) 25 MG tablet Take 25 mg by mouth daily.    [provider]  valACYclovir (VALTREX) 1000 MG tablet Take 1,000 mg by mouth 2 (two) times daily. 04/12/22   [provider]    Physical Exam: Vitals:   07/03/24 0340 07/03/24 0354 07/03/24 0741  BP:  124/74   Pulse:  61   Resp:  12   Temp:  97.7 F (36.5 C) 97.7 F (36.5 C)  TempSrc:   Oral  SpO2:  100%   Weight: 84 kg    Height: 5' 10 (1.778 m)     Physical Exam Vitals reviewed.  Constitutional:      General: He is awake. He is not in  acute distress.    Appearance: He is ill-appearing.  HENT:     Head: Normocephalic.     Nose: No rhinorrhea.     Mouth/Throat:     Mouth: Mucous membranes are dry.  Eyes:     General: No scleral icterus.    Pupils: Pupils are equal, round, and reactive to light.  Neck:     Vascular: No JVD.  Cardiovascular:     Rate and Rhythm: Normal rate and regular rhythm.     Heart sounds: S1 normal and S2 normal.  Pulmonary:     Effort: Pulmonary effort is normal.     Breath sounds: Normal breath sounds. No wheezing, rhonchi or rales.  Abdominal:     General: Bowel sounds are normal. There is no distension.     Tenderness: There is no abdominal tenderness. There is no right CVA tenderness or left CVA tenderness.  Musculoskeletal:     Cervical back: Neck supple.     Right lower leg: No edema.     Left lower leg: No edema.  Skin:    General: Skin is warm and dry.  Neurological:     General: No focal deficit present.     Mental Status: He is alert and oriented to person, place, and time.  Psychiatric:        Mood and Affect: Mood normal.        Behavior: Behavior normal. Behavior is cooperative.     Data Reviewed:  Results are pending, will review when available. 04/02/2022 myocardial perfusion without Lexiscan     up sloping ST depression (II, III and aVF) was noted.   Exercise Tetrofosmin  stress test 04/01/2022: Exercise nuclear stress test was performed using Bruce protocol. Patient reached 7 METS, and 86% of age predicted maximum heart rate. Exercise capacity was fair. No chest pain reported. Heart rate and hemodynamic response were normal.  Stress EKG showed sinus tachycardia, <1 mm upsloping ST depression in leads II, III, aVF that normalize by 2 min into recovery. These changes are equivocal for ischemia.  SPECT images show small sized, severe intensity, reversible perfusion defect in apical anteroseptal myocardium, with associated decreased myocardial thickening and wall motion.  Stress LVEF mildly reduced at 44%.  Intermediate risk study.    Finalized by Elmira Newman PARAS, MD on Tue Apr 02, 2022  1:49 PM Exercise Tetrofosmin  stress test 04/01/2022: Exercise nuclear stress test was performed using Bruce protocol. Patient reached 7 METS, and 86% of age predicted maximum heart rate. Exercise capacity was fair. No chest pain reported. Heart rate and hemodynamic response were normal. Stress EKG revealed no ischemic changes. SPECT images show small sized, severe intensity, reversible perfusion defect, with associated decreased myocardial thickening and wall motion. Stress LVEF mildly reduced at 44%.  High risk  study due to reduced LVEF and perfusion defect, as detailed.  Echocardiogram 05/08/2022: Left ventricle cavity is normal in size and wall thickness. Normal global wall motion. Normal LV systolic function with EF 70%. Normal diastolic filling pattern. Left atrial cavity is mildly dilated. Aneurysmal interatrial septum without 2D or color Doppler evidence of shunting. Mild (Grade I) mitral regurgitation. Trace pericardial effusion. Normal right atrial pressure. No significant change compared to previous study in 2014.  Assessment and Plan: No notes have been filed under this hospital service. Service: Hospitalist     Advance Care Planning:   Code Status: Full Code   Consults:   Family Communication:   Severity of Illness: The appropriate patient status for this patient is OBSERVATION. Observation status is judged to be reasonable and necessary in order to provide the required intensity of service to ensure the patient's safety. The patient's presenting symptoms, physical exam findings, and initial radiographic and laboratory data in the context of their medical condition is felt to place them at decreased risk for further clinical deterioration. Furthermore, it is anticipated that the patient will be medically stable for discharge from the hospital within 2  midnights of admission.   Author: Alm Dorn Castor, MD 07/03/2024 7:50 AM  For on call review [email or web address].   This document was prepared using Dragon voice recognition software and may contain some unintended transcription errors.     [1] No Known Allergies  "

## 2024-07-03 NOTE — ED Notes (Signed)
 Patient and wife requested to talk with doctor about not wanting to stay for observation due to the impending weather coming tonight.  Will reach out to MD to discuss with them.

## 2024-07-03 NOTE — ED Notes (Signed)
 CCMD contacted for monitoring

## 2024-07-03 NOTE — ED Provider Notes (Signed)
 " [place] EMERGENCY DEPARTMENT AT [facility] Provider Note   CSN: [id number] Arrival date & time: 07/03/24  0330     Patient presents with: Loss of Consciousness and Ankle Pain   Larry Welch is a 79 y.o. male.   The history is provided by the patient.  Loss of Consciousness Episode history:  Single Most recent episode:  Today Timing:  Constant Progression:  Resolved Context: not blood draw   Context comment:  Going into the restroom found across bath tub Witnessed: yes   Relieved by:  Nothing Worsened by:  Nothing Ineffective treatments:  None tried Associated symptoms: diaphoresis, nausea and vomiting   Associated symptoms: no chest pain and no seizures   Risk factors: no congenital heart disease and no seizures   Ankle Pain Location:  Ankle Time since incident:  12 hours Injury: yes   Mechanism of injury comment:  Missed step on ladder Ankle location:  R ankle Pain details:    Quality:  Aching   Radiates to: right foot.   Onset quality:  Sudden   Duration:  12 hours   Timing:  Constant Chronicity:  New Dislocation: no   Prior injury to area:  No Relieved by:  Nothing Worsened by:  Nothing Ineffective treatments:  None tried Associated symptoms: no back pain   Risk factors: no concern for non-accidental trauma        Prior to Admission medications  Medication Sig Start Date End Date Taking? Authorizing Provider  amLODipine-olmesartan (AZOR) 10-40 MG per tablet Take 1 tablet by mouth at bedtime.    [provider]  Choline Fenofibrate (FENOFIBRIC ACID) 135 MG CPDR Take 135 mg by mouth at bedtime.     [provider]  dorzolamide-timolol (COSOPT) 2-0.5 % ophthalmic solution Place 1 drop into both eyes 2 (two) times daily. 03/23/22   [provider]  doxycycline  (ADOXA) 100 MG tablet Take 1 tablet (100 mg total) by mouth 2 (two) times daily. Patient not taking: Reported on 08/01/2022 05/10/22   Chadwell, Joshua, PA-C   fluticasone (FLONASE) 50 MCG/ACT nasal spray Place 1 spray into both nostrils in the morning and at bedtime.    [provider]  folic acid (FOLVITE) 1 MG tablet Take 1 mg by mouth in the morning. 03/26/22   [provider]  metFORMIN (GLUCOPHAGE) 1000 MG tablet Take 1,000 mg by mouth daily with breakfast.    [provider]  methotrexate (RHEUMATREX) 2.5 MG tablet Take 15 mg by mouth every Tuesday. 03/22/22   [provider]  Multiple Vitamin (MULTIVITAMIN WITH MINERALS) TABS tablet Take 1 tablet by mouth in the morning.    [provider]  rosuvastatin (CRESTOR) 20 MG tablet Take 20 mg by mouth in the morning.    [provider]  sitaGLIPtin (JANUVIA) 100 MG tablet Take 100 mg by mouth in the morning.    [provider]  solifenacin (VESICARE) 5 MG tablet Take 5 mg by mouth in the morning. 03/17/22   [provider]  spironolactone (ALDACTONE) 25 MG tablet Take 25 mg by mouth daily.    [provider]  valACYclovir (VALTREX) 1000 MG tablet Take 1,000 mg by mouth 2 (two) times daily. 04/12/22   [provider]    Allergies: Patient has no known allergies.    Review of Systems  Constitutional:  Positive for diaphoresis.  HENT:  Negative for facial swelling.   Respiratory:  Negative for wheezing and stridor.   Cardiovascular:  Positive for syncope. Negative for chest pain.  Gastrointestinal:  Positive for nausea and vomiting.  Musculoskeletal:  Negative for back pain.  Neurological:  Negative for seizures.  All other systems reviewed and are negative.   Updated Vital Signs BP 124/74   Pulse 61   Temp 97.7 F (36.5 C)   Resp 12   Ht 5' 10 (1.778 m)   Wt 84 kg   SpO2 100%   BMI 26.57 kg/m   Physical Exam Vitals and nursing note reviewed.  Constitutional:      General: He is not in acute distress.    Appearance: Normal appearance. He is well-developed. He is not diaphoretic.  HENT:      Head: Normocephalic and atraumatic.     Nose: Nose normal.  Eyes:     Conjunctiva/sclera: Conjunctivae normal.     Pupils: Pupils are equal, round, and reactive to light.  Cardiovascular:     Rate and Rhythm: Normal rate and regular rhythm.     Pulses: Normal pulses.     Heart sounds: Normal heart sounds.  Pulmonary:     Effort: Pulmonary effort is normal.     Breath sounds: Normal breath sounds. No wheezing or rales.  Abdominal:     General: Bowel sounds are normal.     Palpations: Abdomen is soft.     Tenderness: There is no abdominal tenderness. There is no guarding or rebound.  Musculoskeletal:        General: Normal range of motion.     Cervical back: Normal range of motion and neck supple.  Skin:    General: Skin is warm and dry.     Capillary Refill: Capillary refill takes less than 2 seconds.  Neurological:     General: No focal deficit present.     Mental Status: He is alert and oriented to person, place, and time.  Psychiatric:        Mood and Affect: Mood normal.     (all labs ordered are listed, but only abnormal results are displayed) Results for orders placed or performed during the hospital encounter of 07/03/24  CBC with Differential   Collection Time: 07/03/24  3:45 AM  Result Value Ref Range   WBC 11.2 (H) 4.0 - 10.5 K/uL   RBC 3.89 (L) 4.22 - 5.81 MIL/uL   Hemoglobin 13.2 13.0 - 17.0 g/dL   HCT 61.0 (L) 60.9 - 47.9 %   MCV 100.0 80.0 - 100.0 fL   MCH 33.9 26.0 - 34.0 pg   MCHC 33.9 30.0 - 36.0 g/dL   RDW 86.2 88.4 - 84.4 %   Platelets 247 150 - 400 K/uL   nRBC 0.0 0.0 - 0.2 %   Neutrophils Relative % 83 %   Neutro Abs 9.4 (H) 1.7 - 7.7 K/uL   Lymphocytes Relative 8 %   Lymphs Abs 0.8 0.7 - 4.0 K/uL   Monocytes Relative 9 %   Monocytes Absolute 1.0 0.1 - 1.0 K/uL   Eosinophils Relative 0 %   Eosinophils Absolute 0.0 0.0 - 0.5 K/uL   Basophils Relative 0 %   Basophils Absolute 0.0 0.0 - 0.1 K/uL   Immature Granulocytes 0 %   Abs Immature  Granulocytes 0.03 0.00 - 0.07 K/uL  Basic metabolic panel   Collection Time: 07/03/24  3:45 AM  Result Value Ref Range   Sodium 140 135 - 145 mmol/L   Potassium 4.9 3.5 - 5.1 mmol/L   Chloride 104 98 - 111 mmol/L  CO2 27 22 - 32 mmol/L   Glucose, Bld 128 (H) 70 - 99 mg/dL   BUN 18 8 - 23 mg/dL   Creatinine, Ser 8.72 (H) 0.61 - 1.24 mg/dL   Calcium 9.6 8.9 - 89.6 mg/dL   GFR, Estimated 59 (L) >60 mL/min   Anion gap 9 5 - 15  Troponin T, High Sensitivity   Collection Time: 07/03/24  3:45 AM  Result Value Ref Range   Troponin T High Sensitivity 12 0 - 19 ng/L  Resp panel by RT-PCR (RSV, Flu A&B, Covid) Anterior Nasal Swab   Collection Time: 07/03/24  4:25 AM   Specimen: Anterior Nasal Swab  Result Value Ref Range   SARS Coronavirus 2 by RT PCR NEGATIVE NEGATIVE   Influenza A by PCR NEGATIVE NEGATIVE   Influenza B by PCR NEGATIVE NEGATIVE   Resp Syncytial Virus by PCR NEGATIVE NEGATIVE   DG Foot Complete Right Result Date: 07/03/2024 EXAM: 3 VIEW(S) XRAY OF THE RIGHT FOOT 07/03/2024 04:21:00 AM COMPARISON: Right ankle series 07/03/2024 reported separately. CLINICAL HISTORY: 79 year old male. Multiple blunt traumatic injuries. Unconscious in bathtub. Right lower extremity injury. Pain. FINDINGS: BONES AND JOINTS: No acute fracture. Mild hallux valgus deformity. No malalignment. Normal bone mineralization for age. Fragmented accessory ossicle adjacent to the cuboid. SOFT TISSUES: Unremarkable. IMPRESSION: 1. No acute fracture or dislocation identified about the right foot. Electronically signed by: Helayne Hurst MD 07/03/2024 04:37 AM EST RP Workstation: [id number]   DG Ankle Complete Right Result Date: 07/03/2024 EXAM: 3 VIEW(S) XRAY OF THE RIGHT ANKLE 07/03/2024 04:21:05 AM CLINICAL HISTORY: 79 year old male. Multiple blunt traumatic injuries. COMPARISON: None available. FINDINGS: BONES AND JOINTS: No acute osseous abnormality identified about the right ankle. 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 SOFT TISSUES: Unremarkable. IMPRESSION: 1. No acute osseous abnormality identified about the right ankle. Electronically signed by: Helayne Hurst MD 07/03/2024 04:36 AM EST RP Workstation: [id number]   DG Chest Portable 1 View Result Date: 07/03/2024 EXAM: 1 VIEW XRAY OF THE CHEST 07/03/2024 04:21:05 AM COMPARISON: Chest radiographs 03/06/2023. CLINICAL HISTORY: 79 year old male. Multiple blunt traumatic injuries. Unconscious in bathtub. Right lower extremity injury. FINDINGS: LUNGS AND PLEURA: No focal pulmonary opacity. No pleural effusion. No pneumothorax. HEART AND MEDIASTINUM: No acute abnormality of the cardiac and mediastinal silhouettes. BONES AND SOFT TISSUES: No acute osseous abnormality. IMPRESSION: 1. No acute cardiopulmonary abnormality. Electronically signed by: Helayne Hurst MD 07/03/2024 04:35 AM EST RP Workstation: [id number]   CT Head Wo Contrast Result Date: 07/03/2024 EXAM: CT HEAD WITHOUT CONTRAST 07/03/2024 04:28:57 AM TECHNIQUE: CT of the head was performed without the administration of intravenous contrast. Automated exposure control, iterative reconstruction, and/or weight based adjustment of the mA/kV was utilized to reduce the radiation dose to as low as reasonably achievable. COMPARISON: None available. CLINICAL HISTORY: 79 year old male. Multiple blunt traumatic injuries. Unconscious in bathtub. Right lower extremity injury. FINDINGS: BRAIN AND VENTRICLES: Brain volume within normal limits for age. No acute hemorrhage. Mild asymmetry of the lateral ventricles appears to be normal variation. No evidence of acute infarct. No hydrocephalus. No extra-axial collection. No mass effect or midline shift. Patchy and confluent, moderate for age periventricular white matter hypodensity which is most pronounced in the periatrial white matter. No suspicious intracranial vascular hyperdensity. Calcified atherosclerosis at the skull base. ORBITS:  No acute abnormality. SINUSES: Visible paranasal sinuses, tympanic cavities and mastoids are clear. SOFT TISSUES AND SKULL: No acute soft tissue abnormality. No skull fracture. IMPRESSION: 1. No acute  traumatic injury identified. 2. Moderate for age cerebral white matter changes most commonly due to small vessel disease. Electronically signed by: Helayne Hurst MD 07/03/2024 04:33 AM EST RP Workstation: [id number]     EKG: EKG Interpretation Date/Time:  Saturday July 03 2024 03:55:42 EST Ventricular Rate:  64 PR Interval:  184 QRS Duration:  80 QT Interval:  390 QTC Calculation: 403 R Axis:   79  Text Interpretation: Sinus rhythm Confirmed by Nettie, Neythan Kozlov ([id number]) on 07/03/2024 3:59:05 AM  Radiology: [facility] Foot Complete Right Result Date: 07/03/2024 EXAM: 3 VIEW(S) XRAY OF THE RIGHT FOOT 07/03/2024 04:21:00 AM COMPARISON: Right ankle series 07/03/2024 reported separately. CLINICAL HISTORY: 79 year old male. Multiple blunt traumatic injuries. Unconscious in bathtub. Right lower extremity injury. Pain. FINDINGS: BONES AND JOINTS: No acute fracture. Mild hallux valgus deformity. No malalignment. Normal bone mineralization for age. Fragmented accessory ossicle adjacent to the cuboid. SOFT TISSUES: Unremarkable. IMPRESSION: 1. No acute fracture or dislocation identified about the right foot. Electronically signed by: Helayne Hurst MD 07/03/2024 04:37 AM EST RP Workstation: [id number]   DG Ankle Complete Right Result Date: 07/03/2024 EXAM: 3 VIEW(S) XRAY OF THE RIGHT ANKLE 07/03/2024 04:21:05 AM CLINICAL HISTORY: 79 year old male. Multiple blunt traumatic injuries. COMPARISON: None available. FINDINGS: BONES AND JOINTS: No acute osseous abnormality identified about the right ankle. 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 SOFT TISSUES: Unremarkable. IMPRESSION: 1. No acute osseous abnormality identified about the right ankle. Electronically signed by: Helayne Hurst MD  07/03/2024 04:36 AM EST RP Workstation: [id number]   DG Chest Portable 1 View Result Date: 07/03/2024 EXAM: 1 VIEW XRAY OF THE CHEST 07/03/2024 04:21:05 AM COMPARISON: Chest radiographs 03/06/2023. CLINICAL HISTORY: 79 year old male. Multiple blunt traumatic injuries. Unconscious in bathtub. Right lower extremity injury. FINDINGS: LUNGS AND PLEURA: No focal pulmonary opacity. No pleural effusion. No pneumothorax. HEART AND MEDIASTINUM: No acute abnormality of the cardiac and mediastinal silhouettes. BONES AND SOFT TISSUES: No acute osseous abnormality. IMPRESSION: 1. No acute cardiopulmonary abnormality. Electronically signed by: Helayne Hurst MD 07/03/2024 04:35 AM EST RP Workstation: [id number]   CT Head Wo Contrast Result Date: 07/03/2024 EXAM: CT HEAD WITHOUT CONTRAST 07/03/2024 04:28:57 AM TECHNIQUE: CT of the head was performed without the administration of intravenous contrast. Automated exposure control, iterative reconstruction, and/or weight based adjustment of the mA/kV was utilized to reduce the radiation dose to as low as reasonably achievable. COMPARISON: None available. CLINICAL HISTORY: 79 year old male. Multiple blunt traumatic injuries. Unconscious in bathtub. Right lower extremity injury. FINDINGS: BRAIN AND VENTRICLES: Brain volume within normal limits for age. No acute hemorrhage. Mild asymmetry of the lateral ventricles appears to be normal variation. No evidence of acute infarct. No hydrocephalus. No extra-axial collection. No mass effect or midline shift. Patchy and confluent, moderate for age periventricular white matter hypodensity which is most pronounced in the periatrial white matter. No suspicious intracranial vascular hyperdensity. Calcified atherosclerosis at the skull base. ORBITS: No acute abnormality. SINUSES: Visible paranasal sinuses, tympanic cavities and mastoids are clear. SOFT TISSUES AND SKULL: No acute soft tissue abnormality. No skull fracture. IMPRESSION: 1. No acute  traumatic injury identified. 2. Moderate for age cerebral white matter changes most commonly due to small vessel disease. Electronically signed by: Helayne Hurst MD 07/03/2024 04:33 AM EST RP Workstation: [id number]     Procedures   Medications Ordered in the ED - No data to display  Medical Decision Making Patient with a fall off laddy onto right ankle then prolonged syncopal event tonight   Amount and/or Complexity of Data Reviewed Independent Historian: spouse    Details: See above  External Data Reviewed: notes.    Details: Previous notes reviewed  Labs: ordered.    Details: Negative covid and flu, normal sodium 140, normal potassium 4.9, normal creatinine.troponin 12 normal . White count slight elevation 11.2, normal hemoglobin 13.2, normal platelets.   Radiology: ordered and independent interpretation performed.    Details: Negative head CT by me  ECG/medicine tests: ordered and independent interpretation performed. Decision-making details documented in ED Course.  Risk Decision regarding hospitalization.    Final diagnoses:  Syncope, unspecified syncope type  Sprain of right ankle, unspecified ligament, initial encounter   The patient appears reasonably stabilized for admission considering the current resources, flow, and capabilities available in the ED at this time, and I doubt any other [facility] requiring further screening and/or treatment in the ED prior to admission.  ED Discharge Orders     None          Chaunice Obie, MD 07/03/24 [id number]  "

## 2024-07-03 NOTE — Progress Notes (Deleted)
 Orthopedic Tech Progress Note Patient Details:  Larry Welch [birth year] [id number]  Patient ID: Larry Welch [name], male   DOB: 09/12/1945, 79 y.o.   MRN: [id number] Pt. Left AMA prior to my arrival. [name] 07/03/2024, 9:50 AM

## 2024-07-14 ENCOUNTER — Encounter: Payer: Self-pay | Admitting: *Deleted

## 2024-07-14 NOTE — Progress Notes (Signed)
 JAMISON SOWARD                                          MRN: [id number]   07/14/2024   The [facility] Quality Team Specialist reviewed this patient medical record for the purposes of chart review for care gap closure. The following were reviewed: chart review for care gap closure-controlling blood pressure.    [facility] Quality Team
